# Patient Record
Sex: Male | Born: 1970 | Race: White | Hispanic: No | Marital: Married | Smoking: Never smoker
Health system: Southern US, Community
[De-identification: ages and names within clinical notes are randomized; demographics above are authoritative.]

## PROBLEM LIST (undated history)

## (undated) DIAGNOSIS — F419 Anxiety disorder, unspecified: Secondary | ICD-10-CM

## (undated) DIAGNOSIS — K219 Gastro-esophageal reflux disease without esophagitis: Secondary | ICD-10-CM

## (undated) DIAGNOSIS — IMO0001 Reserved for inherently not codable concepts without codable children: Secondary | ICD-10-CM

## (undated) DIAGNOSIS — K589 Irritable bowel syndrome without diarrhea: Secondary | ICD-10-CM

## (undated) DIAGNOSIS — J309 Allergic rhinitis, unspecified: Secondary | ICD-10-CM

## (undated) DIAGNOSIS — I10 Essential (primary) hypertension: Secondary | ICD-10-CM

## (undated) DIAGNOSIS — I341 Nonrheumatic mitral (valve) prolapse: Secondary | ICD-10-CM

## (undated) DIAGNOSIS — J324 Chronic pansinusitis: Secondary | ICD-10-CM

## (undated) HISTORY — DX: Essential (primary) hypertension: I10

## (undated) HISTORY — PX: COLONOSCOPY: SHX5424

## (undated) HISTORY — PX: TONSILLECTOMY: SUR1361

## (undated) HISTORY — DX: Irritable bowel syndrome, unspecified: K58.9

## (undated) HISTORY — PX: CARPAL TUNNEL RELEASE: SHX101

## (undated) HISTORY — DX: Reserved for inherently not codable concepts without codable children: IMO0001

## (undated) HISTORY — DX: Gastro-esophageal reflux disease without esophagitis: K21.9

## (undated) HISTORY — PX: WISDOM TOOTH EXTRACTION: SHX21

## (undated) HISTORY — DX: Allergic rhinitis, unspecified: J30.9

## (undated) HISTORY — DX: Anxiety disorder, unspecified: F41.9

## (undated) HISTORY — PX: ADENOIDECTOMY: SUR15

---

## 1999-07-28 ENCOUNTER — Encounter (INDEPENDENT_AMBULATORY_CARE_PROVIDER_SITE_OTHER): Payer: Self-pay | Admitting: Specialist

## 1999-07-28 ENCOUNTER — Other Ambulatory Visit: Admission: RE | Admit: 1999-07-28 | Discharge: 1999-07-28 | Payer: Self-pay | Admitting: Otolaryngology

## 2004-02-13 ENCOUNTER — Ambulatory Visit (HOSPITAL_COMMUNITY): Admission: RE | Admit: 2004-02-13 | Discharge: 2004-02-13 | Payer: Self-pay | Admitting: Family Medicine

## 2005-01-07 ENCOUNTER — Emergency Department (HOSPITAL_COMMUNITY): Admission: EM | Admit: 2005-01-07 | Discharge: 2005-01-07 | Payer: Self-pay | Admitting: Family Medicine

## 2005-11-14 ENCOUNTER — Ambulatory Visit (HOSPITAL_COMMUNITY): Admission: RE | Admit: 2005-11-14 | Discharge: 2005-11-14 | Payer: Self-pay | Admitting: Family Medicine

## 2006-08-24 ENCOUNTER — Ambulatory Visit (HOSPITAL_COMMUNITY): Admission: RE | Admit: 2006-08-24 | Discharge: 2006-08-24 | Payer: Self-pay | Admitting: Family Medicine

## 2006-08-31 ENCOUNTER — Encounter (HOSPITAL_COMMUNITY): Admission: RE | Admit: 2006-08-31 | Discharge: 2006-09-30 | Payer: Self-pay | Admitting: Family Medicine

## 2006-09-04 ENCOUNTER — Ambulatory Visit: Payer: Self-pay | Admitting: Gastroenterology

## 2006-09-06 ENCOUNTER — Ambulatory Visit (HOSPITAL_COMMUNITY): Admission: RE | Admit: 2006-09-06 | Discharge: 2006-09-06 | Payer: Self-pay | Admitting: Gastroenterology

## 2006-09-06 ENCOUNTER — Encounter: Payer: Self-pay | Admitting: Gastroenterology

## 2006-09-06 ENCOUNTER — Ambulatory Visit: Payer: Self-pay | Admitting: Gastroenterology

## 2006-11-03 ENCOUNTER — Ambulatory Visit: Payer: Self-pay | Admitting: Gastroenterology

## 2006-11-09 ENCOUNTER — Ambulatory Visit (HOSPITAL_COMMUNITY): Admission: RE | Admit: 2006-11-09 | Discharge: 2006-11-09 | Payer: Self-pay | Admitting: Gastroenterology

## 2006-11-14 ENCOUNTER — Ambulatory Visit: Payer: Self-pay | Admitting: Gastroenterology

## 2006-12-26 ENCOUNTER — Ambulatory Visit: Payer: Self-pay | Admitting: Gastroenterology

## 2006-12-28 ENCOUNTER — Ambulatory Visit (HOSPITAL_COMMUNITY): Admission: RE | Admit: 2006-12-28 | Discharge: 2006-12-28 | Payer: Self-pay | Admitting: Gastroenterology

## 2006-12-28 ENCOUNTER — Ambulatory Visit: Payer: Self-pay | Admitting: Gastroenterology

## 2007-02-01 ENCOUNTER — Ambulatory Visit (HOSPITAL_COMMUNITY): Admission: RE | Admit: 2007-02-01 | Discharge: 2007-02-01 | Payer: Self-pay

## 2007-04-04 ENCOUNTER — Emergency Department (HOSPITAL_COMMUNITY): Admission: EM | Admit: 2007-04-04 | Discharge: 2007-04-04 | Payer: Self-pay | Admitting: Emergency Medicine

## 2008-05-23 ENCOUNTER — Ambulatory Visit: Payer: Self-pay | Admitting: Sports Medicine

## 2008-05-23 DIAGNOSIS — M2140 Flat foot [pes planus] (acquired), unspecified foot: Secondary | ICD-10-CM | POA: Insufficient documentation

## 2008-05-23 DIAGNOSIS — M545 Low back pain, unspecified: Secondary | ICD-10-CM | POA: Insufficient documentation

## 2009-02-10 ENCOUNTER — Emergency Department (HOSPITAL_COMMUNITY): Admission: EM | Admit: 2009-02-10 | Discharge: 2009-02-10 | Payer: Self-pay | Admitting: Emergency Medicine

## 2009-02-11 ENCOUNTER — Encounter: Admission: RE | Admit: 2009-02-11 | Discharge: 2009-03-02 | Payer: Self-pay | Admitting: Orthopedic Surgery

## 2009-03-02 ENCOUNTER — Encounter: Admission: RE | Admit: 2009-03-02 | Discharge: 2009-05-13 | Payer: Self-pay | Admitting: Orthopedic Surgery

## 2009-06-07 ENCOUNTER — Emergency Department (HOSPITAL_COMMUNITY): Admission: EM | Admit: 2009-06-07 | Discharge: 2009-06-07 | Payer: Self-pay | Admitting: Family Medicine

## 2009-07-02 ENCOUNTER — Encounter: Payer: Self-pay | Admitting: Gastroenterology

## 2009-07-14 ENCOUNTER — Encounter: Payer: Self-pay | Admitting: Gastroenterology

## 2009-07-31 ENCOUNTER — Telehealth (INDEPENDENT_AMBULATORY_CARE_PROVIDER_SITE_OTHER): Payer: Self-pay

## 2009-08-03 ENCOUNTER — Ambulatory Visit (HOSPITAL_COMMUNITY): Admission: RE | Admit: 2009-08-03 | Discharge: 2009-08-03 | Payer: Self-pay | Admitting: Gastroenterology

## 2009-08-03 ENCOUNTER — Ambulatory Visit: Payer: Self-pay | Admitting: Gastroenterology

## 2010-04-01 NOTE — Progress Notes (Signed)
Summary: SUPREP KIT GIVEN  Phone Note Call from Patient   Caller: Patient Summary of Call: Pt's wife came by. She had been to two pharmacies to get the SUPREP KIT  and neither had it. I gave her a kit from the office. Initial call taken by: Cloria Spring LPN,  August 01, 6043 10:49 AM

## 2010-04-01 NOTE — Letter (Signed)
Summary: Internal Other [facility]  Internal Other [facility]ingo Dimes   Imported By: Cloria Spring LPN 62/13/0865 78:46:96  _____________________________________________________________________  External Attachment:    Type:   Image     Comment:   External Document

## 2010-04-01 NOTE — Letter (Signed)
Summary: Internal Other [facility]  Internal Other [facility]   Imported By: Cloria Spring LPN 14/78/2956 21:30:86  _____________________________________________________________________  External Attachment:    Type:   Image     Comment:   External Document

## 2010-07-13 NOTE — Op Note (Signed)
NAME:  Brady [name], Brady [name]           ACCOUNT NO.:  [id number]   MEDICAL RECORD NO.:  [id number]          PATIENT TYPE:  AMB   LOCATION:  DAY                           FACILITY:  [facility]   PHYSICIAN:  Kassie Mends, M.D.      DATE OF BIRTH:  [birth year]   DATE OF PROCEDURE:  12/28/2006  DATE OF DISCHARGE:  12/28/2006                               OPERATIVE REPORT   PROCEDURE:  Capsule Endoscopy   INDICATION FOR EXAM:  [name] is a 40 year old male, who continues  to complain of persistent abdominal pain.  His evaluation for abdominal  pain has included an upper endoscopy and a colonoscopy which revealed no  etiology.  He does use anti-inflammatory drugs.   PROCEDURE DATA:  Height 75 inches, weight 198.6 pounds, waist 40 inches.  Gastric passage time 47 minutes.  Small bowel passage time five hours  and 55 minutes.   PROCEDURE FINDINGS:  1. Limited view of the gastric mucosa.  Mucosal lining appeared      normal.  2. Excellent views of the small bowel mucosa.  No erythema, ulcers,      mass or arteriovenous malformations seen.  3. Limited view of colonic mucosa due to stool.   SUMMARY AND RECOMMENDATIONS:  Will call Celebrex 200 mg daily for pain.  He also may use hydrocodone 5/325 #25 one or two by mouth every four to  six hours as needed for abdominal pain. He is to proceed with an  evaluation at [facility] for his  persistent abdominal pain.  No etiology for his abdominal pain has been  identified.      Kassie Mends, M.D.  Electronically Signed     SM/MEDQ  D:  12/31/2006  T:  01/01/2007  Job:  [id number]   cc:   Lorin Picket A. Gerda Diss, MD  Fax: [phone]

## 2010-07-13 NOTE — Op Note (Signed)
Brady [name], Brady [name]           ACCOUNT NO.:  [id number]   MEDICAL RECORD NO.:  [id number]          PATIENT TYPE:  AMB   LOCATION:  DAY                           FACILITY:  [facility]   PHYSICIAN:  Kassie Mends, M.D.      DATE OF BIRTH:  Sep 30, 1970   DATE OF PROCEDURE:  09/06/2006  DATE OF DISCHARGE:                               OPERATIVE REPORT   REFERRING PHYSICIAN:  Scott A. Gerda Diss, MD   PROCEDURE:  Esophagogastroduodenoscopy with cold forceps biopsy.   INDICATION FOR EXAM:  Brady [name] is a 40 year old male who has  persistent epigastric pain and weight loss.   FINDINGS:  1. Patchy erythema in the antrum without ulcers or erosions.  Biopsies      obtained via cold forceps to evaluate for H. pylori gastritis or      eosinophilic gastritis.  2. Normal duodenal bulb and second portion of the duodenum.  Biopsies      obtained to evaluate for celiac sprue or eosinophilic gastritis,      eosinophilic duodenitis.  3. Normal esophagus without evidence of Barrett's erosions or      ulcerations.   RECOMMENDATIONS:  1. Brady [name] abdominal pain and weight loss may be due to      gastroesophageal reflux disease, gastritis or celiac sprue.  Will      call Brady [name] with the results of his biopsies.  2. He should continue his Prilosec twice daily and continue to avoid      gastric irritants.  He is given a handout on gastric irritants and      gastritis.  3. He already has a follow-up appointment to see me in 4-6 weeks.   MEDICATIONS:  1. Demerol 100 mg IV.  2. Versed 8 mg IV.   PROCEDURE TECHNIQUE:  Physical exam was performed.  Informed consent was  obtained from the patient after explaining the benefits, risks and  alternatives to the procedure.  The patient was connected to the monitor  and placed in the left lateral position.  Continuous oxygen was provided  by nasal cannula and IV medicine administered through an indwelling  cannula.  After administration of sedation,  the patient's esophagus was  intubated and the  scope was advanced under direct visualization to the second portion of  the duodenum.  The scope was withdrawn slowly by carefully examining the  color, texture, anatomy and integrity of the mucosa on the way out.  The  patient was recovered in endoscopy and discharged home in satisfactory  condition.      Kassie Mends, M.D.  Electronically Signed     SM/MEDQ  D:  09/06/2006  T:  09/06/2006  Job:  [id number]   cc:   Lorin Picket A. Gerda Diss, MD  Fax: [phone]

## 2010-07-13 NOTE — Consult Note (Signed)
NAMETRYONE, KILLE           ACCOUNT NO.:  [id number]   MEDICAL RECORD NO.:  [id number]          PATIENT TYPE:  AMB   LOCATION:  DAY                           FACILITY:  [facility]   PHYSICIAN:  Brady [name], M.D.      DATE OF BIRTH:  Jun 27, 1970   DATE OF CONSULTATION:  12/26/2006  DATE OF DISCHARGE:                                 CONSULTATION   REFERRING PHYSICIAN:  Scott A. Gerda Diss, MD.   PROBLEM LIST:  1. Persistent right upper quadrant abdominal pain.  2. History of mild gastritis.  3. Weight loss.   SUBJECTIVE:  Brady [name] is a 40 year old male who was initially seen  in July of 2008.  When he was seen in July of 2008 he was complaining of  persistent epigastric pain and weight loss.  He had been having this  problem since December of 2007.  He reports that whenever he would eat  he would get abdominal pain.  He describes the pain as located in the  middle of his abdomen radiating to the right upper quadrant, right flank  and back.  It begins 10-15 minutes after eating.  It is constant and  achy.  He feels bloated approximately 5-10 minutes after eating.  He  tried avoiding dairy products and fatty foods which did improve his  symptoms.  He complains of burping and feeling weak.  He had an  intentional 35 pound weight loss and an unintentional 15 pound weight  loss.  Complains of subjective chills after eating.  He has had an upper  endoscopy performed which on gross exam showed patchy erythema in the  antrum without ulcer or erosion.  Biopsies showed benign gastric mucosa  and benign small bowel mucosa.  No evidence of celiac sprue.  So he was  asked to continue on a proton pump inhibitor, Prilosec twice daily and  to avoid gastric irritants.  Approximately 7 days following his exam he  states that his pain was better on twice daily Prilosec.  He was asked  to follow up with [facility] GERD self-care recommendations.  On his  initial exam he was 218 pounds.  Two months  later he was 208 pounds.  He  was seen in September of 2008 and had an endoscopic ultrasound performed  by Dr. Christella Hartigan because of his persistent right upper quadrant pain to  rule out microlithiasis or sludge as an etiology for his pain.  His  endoscopic ultrasound was normal.  His pancreatic parenchyma was normal  throughout the gland.  His common bile duct was normal and nondilated  without stones or echogenic free.  His main pancreatic duct was normal.  He had a normal gallbladder.  He was seen and asked to continue his  Prilosec 20 mg daily.   He is seen today as a return patient visit.  He is now 208 pounds.  He  continues to complain of pain primarily in his right upper quadrant but  he has some radiation to his right lower quadrant.  He has nausea with  eating.  He has come near to vomiting but not quite  fully manifested  emesis.  He is avoiding red meats and fatty foods.  He is able to [place]  sandwiches.  His bowel movements are normal.  Sometimes he is bloated.  He describes the pain as crampy pain after eating and when he gets  hungry.  He has had some dizziness associated with a sinus infection.  The abdominal pain occurs several times a day.  He denies any rashes or  sores in his mouth.  He gets hungry but food causes him to be  uncomfortable on his right side.   MEDICATIONS:  1. Prilosec daily.  2. Glucosamine.  3. Vitamins.  4. Allegra 180 mg daily.  5. Flonase.  6. Clindamycin.   PHYSICAL EXAMINATION:  VITAL SIGNS:  Weight 202 pounds, height 6 feet 3  inches, BMI 25.3 (overweight), temperature 97.9, blood pressure 128/88,  pulse 64.  GENERAL:  He is in no apparent distress.  Alert and oriented x4.  HEENT:  Exam is atraumatic, normocephalic.  Pupils equal and reactive to  light.  Mouth no oral lesions.  Posterior pharynx without erythema or  exudate.  NECK:  Full range of motion.  No lymphadenopathy.  LUNGS:  Clear to auscultation bilaterally.  CARDIOVASCULAR:   Shows regular rhythm, no murmur, normal S1-S2.  ABDOMEN:  Bowel sounds are present, soft, nontender, nondistended.  No  rebound or guarding.  EXTREMITIES:  Without cyanosis, clubbing or edema.  SKIN:  He has no pretibial lesions.  NEUROLOGICAL:  He has no focal neurologic deficits.   ASSESSMENT:  Brady [name] is a 40 year old male who has persistent  right upper quadrant sometimes right lower quadrant pain that is  associated with nausea and it is usually after eating.  He has had a  workup to include a complete abdominal ultrasound in June of 2008 which  showed a gallbladder which was normal, no stones.  Liver, pancreas and  spleen were normal.  He has had a HIDA scan which showed a gallbladder  ejection fraction of 61.7% and patent biliary and cystic duct.  The  differential diagnosis for his persistent symptoms includes irritable  bowel syndrome, low likelihood of small bowel bacterial overgrowth or  occult small bowel disease.   Thank you for allowing me to see Brady [name] in consultation.  My  recommendations follow:   RECOMMENDATIONS:  1. Will check an irritable bowel Prometheus panel.  2. Will schedule a capsule endoscopy on Thursday to completely      complete the evaluation of his GI tract.  3. He declined pain medicine.  Following the capsule endoscopy will      begin Levsin sublingual 0.125 mg 30 minutes before eating to see if      we can assist with the pain.  He could probably benefit also from      the addition of fiber.  4. Will schedule an appointment at [facility] within the next month for a second opinion in      regards to his abdominal pain.  5. He has a follow up appointment to see me in 4-6 weeks.      Brady [name], M.D.  Electronically Signed     SM/MEDQ  D:  12/26/2006  T:  12/27/2006  Job:  [id number]   cc:   Brady Picket A. Gerda Diss, MD  Fax: [phone]

## 2010-07-13 NOTE — Consult Note (Signed)
Brady [name], Brady [name]           ACCOUNT NO.:  [id number]   MEDICAL RECORD NO.:  [id number]          PATIENT TYPE:  AMB   LOCATION:  DAY                           FACILITY:  [facility]   PHYSICIAN:  Kassie Mends, M.D.      DATE OF BIRTH:  November 17, 1970   DATE OF CONSULTATION:  09/04/2006  DATE OF DISCHARGE:                                 CONSULTATION   REFERRING PHYSICIAN:  Scott A. Luking, MD.   REASON FOR CONSULTATION:  Abdominal pain and weight loss.   HISTORY OF PRESENT ILLNESS:  Brady [name] is a 40 year old male whose  had persistent abdominal pain since father's day.  He has never had a  problem like this before.  He has been treated for gastroesophageal  reflux disease in December 2007 with Prilosec but this pain is different  than his heartburn pain.  Whenever he eats he gets abdominal pain.  The  abdominal pain is located in the middle of his abdomen and radiates to  his right upper quadrant, right flank and back.  It begins 10-15 minutes  after eating.  It is a constant achy pain that gets worse when he eats.  He also feels bloated and full approximately 5-10 minutes after eating.  He has cut out dairy products and fatty foods which seem to have  improved his symptoms.  He has a lot of burping every morning between  4:40 and 5:00 a.m.  He is also feeling weak.  He also has changed his  consumption to small meals frequently.  He did have a 35-pound  intentional weight loss but since Father's Day he reports losing 15  pounds unintentionally.  For the last week, his Prilosec has been  increased to twice a day but he has not seen any improvement in his  symptoms.  Occasionally he reports subjective chills. The chills may  occur right after he eats and sometimes later in the evening.  He also  has entirely liquid stool two to three times a week.  He has not seen  any blood in his stool.  He complains of frequent urination as well.  He  is nauseated one to two times a day.  The  severity is mild to moderate.  Some days he has good days and some days he has bad days.  His usual  diet consists of chicken and [place].  He also can tolerate soups and  cereals as well as skim milk. He had a hot dog on the 4th of July which  causes significant increase in his pain.  He denies any problems  swallowing.  He has not had any constipation.   PAST MEDICAL HISTORY:  1. Mitral valve prolapse.  2. Allergies .  3. Gastroesophageal reflux disease.   PAST SURGICAL HISTORY:  1. Carpal tunnel syndrome surgery.  2. Sinus surgery.  3. Wisdom tooth extraction.  4. Tonsillectomy.   ALLERGIES:  PENICILLIN.   MEDICATIONS:  1. Prilosec twice daily.  2. Glucosamine.  3. Multivitamin.  4. Allegra 180 mg.  5. Flonase.   FAMILY HISTORY:  He has no family history of colon polyps.  He had an  uncle who had colon cancer in his 19s.  He also has a family history of  diabetes, hypertension and gastroesophageal reflux disease.   SOCIAL HISTORY:  He is married and has a 61-year-old child.  He works for  [employer] in [employer]. He does not smoke.  He  drinks less than a six-pack a year.   REVIEW OF SYSTEMS:  Per the HPI otherwise all systems negative.   PHYSICAL EXAM:  VITAL SIGNS:  Weight 216.5 pounds, height 6 feet 3  inches, BMI 27 (overweight), temperature 98.1, blood pressure 120/90,  pulse 60.  GENERAL:  He is in no apparent distress, alert and oriented x4.  HEENT:  Atraumatic, normocephalic.  Pupils equal and reactive to light.  Mouth  no oral lesions.  Posterior pharynx without erythema or exudate.  NECK:  Has full range of motion and no lymphadenopathy.  LUNGS:  Clear to  auscultation bilaterally.  CARDIOVASCULAR:  Regular rhythm, no murmur,  normal S1 and S2.  ABDOMEN:  Bowel sounds are present, soft,  nondistended, mild tenderness to palpation in the epigastrium without  rebound or guarding, no hepatosplenomegaly, abdominal bruits or  pulsatile masses.   EXTREMITIES:  Without cyanosis, clubbing or edema.  NEURO:  He has no focal neurologic deficits.   RADIOGRAPHIC STUDIES:  June 26 complete ultrasound showed gallbladder  without stones or wall thickening.  His common bile duct was 3 mm.  His  liver and pancreas were normal.  He had a HIDA scan which showed a  gallbladder ejection fraction of 61.7%.   LABS:  July 2008 - white count 8.1, hemoglobin 16.5, platelets 241, BUN  9, creatinine 0.91, albumin 4.3, ALT 16, AST 16, amylase 69, lipase 26.   ASSESSMENT:  Brady [name] is a 40 year old male who has persistent  epigastric pain which radiates to his right upper quadrant in his back  that is worse with meals.  The differential diagnosis includes  gastritis, ulcer, and a low likelihood of sphincter of Oddi dysfunction.  Thank you for allowing me to see Brady [name] in consultation.  My  recommendations follow.   RECOMMENDATIONS:  1. Brady [name] should continue his Prilosec twice daily.  He is      given a handout on gastric irritants and asked to avoid them.  He      rarely uses Aleve but I wanted to ensure that he understands that      he should avoid anti-inflammatory drugs.  2. He will be scheduled for an upper endoscopy on Wednesday, July 9.      Biopsies will be obtained of the antrum and the second portion of      the duodenum to evaluate for celiac sprue.  3. Follow-up in 4-6 weeks.      Kassie Mends, M.D.  Electronically Signed     SM/MEDQ  D:  09/04/2006  T:  09/05/2006  Job:  [id number]   cc:   Lorin Picket A. Gerda Diss, MD  Fax: [phone]

## 2010-07-13 NOTE — Assessment & Plan Note (Signed)
NAME:  MEHTAB, DOLBERRY            CHART#:  [id number]   DATE:  11/03/2006                       DOB:  01/03/1971   REFERRED BY:  Dr. Lilyan Punt.   PROBLEM LIST:  1. Episodic abdominal pain.  2. Mild gastritis.  3. Weight loss.   SUBJECTIVE:  Mr. Kirsh is a 40 year old male who presents as a  return patient visit. He is able to tolerate more in his diet. Fried  foods, ice cream, tomatoes, and other fatty foods give him pain that  begins in his right upper quadrant and radiates to his back. He also may  have diarrhea. The symptoms start 15 to 20 minutes after eating and last  a couple of hours. The pain is better with the addition of Prilosec, but  not completely resolved. As long as he avoids fatty foods, his symptoms  do not reoccur. Recently, he had fried zucchini and it brought on the  symptoms. Pain is the only thing that this food brings on. The pain  severity allows him to function. He does drink one beer a month. Last  week he had one beer and it caused his pain to occur. His grandfather  and his grandmother had their gallbladders removed.   MEDICATIONS:  1. Prilosec twice daily.  2. Glucosamine.  3. Vitamins.  4. Allegra 180 mg daily.  5. Flonase daily.   OBJECTIVE:  VITAL SIGNS:  Weight 208.5 pounds (down 8 pounds since July  of 2008), height 6 foot 2 inches, BMI 26.7 (overweight), temperature  98.3, blood pressure 118/80, pulse 60.GENERAL:  He is in no apparent  distress. He  is alert and oriented x4.LUNGS:  Clear to auscultation  bilaterally.CARDIOVASCULAR:  He has a regular rhythm, no murmurs, normal  S1 and S2.ABDOMEN:  Bowel sounds are present, soft, nontender,  nondistended, no rebound or guarding.NEUROLOGIC:  He has no focal  neurologic deficits.   ASSESSMENT:  Mr. Conely is a 40 year old male who has episodic right  upper quadrant pain that radiates to his back. The differential  diagnosis includes gastritis and a biliary source (distal common  bile  duct stone, sludge, microlithiasis). He has a low likelihood of  sphincter of Oddi dysfunction. Thank you for allowing me to see Mr.  Spittler in consultation. My recommendations are as follows.   RECOMMENDATIONS:  1. He is given a prescription for Prilosec 20 mg tablets daily. His      dose has been reduced to see if we can still achieve pain relief      without using the higher dose.  2. He will be scheduled for an endoscopic ultrasound as soon as      possible with Dr. Christella Hartigan to evaluate for sludge, microlithiasis, or      distal common bile duct stone as an etiology for his post-prandial      right upper quadrant pain that radiates to his back. He has never      had any liver enzyme elevations associated with his pain.  3. Return patient visit in six weeks.       Kassie Mends, M.D.  Electronically Signed     SM/MEDQ  D:  11/03/2006  T:  11/04/2006  Job:  [id number]   cc:   Lorin Picket A. Gerda Diss, MD

## 2010-12-29 ENCOUNTER — Inpatient Hospital Stay (INDEPENDENT_AMBULATORY_CARE_PROVIDER_SITE_OTHER)
Admission: RE | Admit: 2010-12-29 | Discharge: 2010-12-29 | Disposition: A | Discharge status: Home / Self Care | Payer: 59 | Source: Ambulatory Visit | Attending: Family Medicine | Admitting: Family Medicine

## 2010-12-29 DIAGNOSIS — J019 Acute sinusitis, unspecified: Secondary | ICD-10-CM

## 2011-03-29 ENCOUNTER — Ambulatory Visit (INDEPENDENT_AMBULATORY_CARE_PROVIDER_SITE_OTHER): Payer: 59 | Admitting: Orthopedic Surgery

## 2011-03-29 ENCOUNTER — Encounter: Payer: Self-pay | Admitting: Orthopedic Surgery

## 2011-03-29 VITALS — Ht 75.0 in | Wt 252.0 lb

## 2011-03-29 DIAGNOSIS — M7710 Lateral epicondylitis, unspecified elbow: Secondary | ICD-10-CM | POA: Insufficient documentation

## 2011-03-29 NOTE — Progress Notes (Signed)
Patient ID: BLANCHARD WILLHITE, male   DOB: [birth year], 41 y.o.   MRN: [id number] X-ray report.  3 views LEFT elbow.  LEFT elbow pain.  AP, lateral, LEFT elbow normal joint surfaces. Normal bone alignment.  Impression normal LEFT elbow. Subjective:    BAYAN KUSHNIR is a 41 y.o. male who presents with left elbow pain. Onset of the symptoms was June 2012. Inciting event: none known. Current symptoms include: point tenderness over the lateral epicondyle. Pain is aggravated by: grasping, lifting heavy objects, lifting small objects . Symptoms have waxed and waned. Patient has had prior elbow problems. Evaluation to date: none. Treatment to date: avoidance of offending activity, corticosteroid injection which was somewhat effective, forearm band which was somewhat effective, ice and OTC analgesics.  The following portions of the patient's history were reviewed and updated as appropriate: allergies, current medications, past family history, past medical history, past social history, past surgical history and problem list.  Review of Systems A comprehensive review of systems was negative.   Objective:    Ht 6\' 3"  (1.905 m)  Wt 252 lb (114.306 kg)  BMI 31.50 kg/m2  Physical Exam(12) GENERAL: normal development   CDV: pulses are normal   Skin: normal  Lymph: nodes were not palpable/normal  Psychiatric: awake, alert and oriented  Neuro: normal sensation  Right elbow: without deformity and full active ROM  Left elbow:  without deformity, swelling present, full active ROM and tenderness over lateral epicondyle   X-ray left elbow: no fracture, dislocation, swelling or degenerative changes noted   Assessment:    left lateral epicondylitis    Plan:    Tennis elbow splint. NSAIDs per medication orders. OTC analgesics as needed. PT referral.

## 2011-03-29 NOTE — Patient Instructions (Addendum)
Start physical therapy Apply Max Freeze to elbow three times daily Wear brace daily

## 2011-04-13 ENCOUNTER — Ambulatory Visit: Payer: 59 | Attending: Orthopedic Surgery

## 2011-04-13 DIAGNOSIS — M25539 Pain in unspecified wrist: Secondary | ICD-10-CM | POA: Insufficient documentation

## 2011-04-13 DIAGNOSIS — M25629 Stiffness of unspecified elbow, not elsewhere classified: Secondary | ICD-10-CM | POA: Insufficient documentation

## 2011-04-13 DIAGNOSIS — IMO0001 Reserved for inherently not codable concepts without codable children: Secondary | ICD-10-CM | POA: Insufficient documentation

## 2011-04-19 ENCOUNTER — Ambulatory Visit: Payer: 59 | Admitting: Physical Therapy

## 2011-04-21 ENCOUNTER — Ambulatory Visit: Payer: 59 | Admitting: Rehabilitation

## 2011-04-25 ENCOUNTER — Ambulatory Visit: Payer: 59

## 2011-04-27 ENCOUNTER — Ambulatory Visit: Payer: 59

## 2011-05-09 ENCOUNTER — Ambulatory Visit: Payer: 59 | Attending: Orthopedic Surgery

## 2011-05-09 DIAGNOSIS — M25539 Pain in unspecified wrist: Secondary | ICD-10-CM | POA: Insufficient documentation

## 2011-05-09 DIAGNOSIS — IMO0001 Reserved for inherently not codable concepts without codable children: Secondary | ICD-10-CM | POA: Insufficient documentation

## 2011-05-10 ENCOUNTER — Encounter: Payer: Self-pay | Admitting: Orthopedic Surgery

## 2011-05-10 ENCOUNTER — Ambulatory Visit (INDEPENDENT_AMBULATORY_CARE_PROVIDER_SITE_OTHER): Payer: 59 | Admitting: Orthopedic Surgery

## 2011-05-10 VITALS — BP 124/62 | Ht 75.0 in | Wt 252.0 lb

## 2011-05-10 DIAGNOSIS — M7710 Lateral epicondylitis, unspecified elbow: Secondary | ICD-10-CM

## 2011-05-10 NOTE — Progress Notes (Signed)
Patient ID: Brady [name], male   DOB: 1970/05/19, 41 y.o.   MRN: [id number] Chief Complaint  Patient presents with  . Follow-up    6 week recheck Left elbow following PT/tennis elbow    Follow-up visit after physical therapy for LEFT tennis elbow  Patient complains of Continued pain along the lateral upper condyle despite one year of treatment 3 injections anti-inflammatories physical therapy electrical stimulation tennis elbow bracing Review of Systems  Neurological: Negative for tingling and sensory change.    We discussed possibility of tennis elbow release.  We discussed low complication rate mainly wound infection and recurrence.  The patient is going out-of-town for peptic training will call as when he gets back to schedule the surgery late April early May.  Today's exam  LEFT elbow: Tenderness over the radial head joint capsule and lateral upper condyle pain full extension of the wrist.  Full range of motion.  Mild weakness in extension elbow remained stable.  Skin is intact neurovascular exam is normal  Recommend as above

## 2011-05-10 NOTE — Patient Instructions (Signed)
Call after EPIC training to schedule surgery

## 2011-05-12 ENCOUNTER — Ambulatory Visit: Payer: 59

## 2011-06-09 ENCOUNTER — Other Ambulatory Visit: Payer: Self-pay | Admitting: *Deleted

## 2011-06-20 ENCOUNTER — Telehealth: Payer: Self-pay | Admitting: Orthopedic Surgery

## 2011-06-20 NOTE — Telephone Encounter (Signed)
Contact to insurer, [employer],ph# [phone], regarding pre-authorization information, out-patient surgery scheduled 07/08/11 at [facility], for left tennis elbow release; potential CPT codes 28413, [phone]

## 2011-06-22 NOTE — Telephone Encounter (Signed)
Called back to [facility] at another ph#, [phone]. Per Doreatha Lew, no pre-authorization required for out-patient surgery. Reference # [id number].

## 2011-06-29 ENCOUNTER — Encounter (HOSPITAL_COMMUNITY): Payer: Self-pay | Admitting: Pharmacy Technician

## 2011-06-30 ENCOUNTER — Encounter (HOSPITAL_COMMUNITY): Payer: Self-pay

## 2011-06-30 ENCOUNTER — Encounter (HOSPITAL_COMMUNITY)
Admission: RE | Admit: 2011-06-30 | Discharge: 2011-06-30 | Disposition: A | Discharge status: Home / Self Care | Payer: 59 | Source: Ambulatory Visit | Attending: Orthopedic Surgery | Admitting: Orthopedic Surgery

## 2011-06-30 HISTORY — DX: Nonrheumatic mitral (valve) prolapse: I34.1

## 2011-06-30 LAB — BASIC METABOLIC PANEL
BUN: 14 mg/dL (ref 6–23)
Calcium: 9.8 mg/dL (ref 8.4–10.5)
GFR calc Af Amer: >90 mL/min (ref >90)
GFR calc non Af Amer: 83 mL/min — ABNORMAL LOW (ref >90)
Glucose, Bld: 66 mg/dL — ABNORMAL LOW (ref 70–99)
Potassium: 4.6 mEq/L (ref 3.5–5.1)
Sodium: 140 mEq/L (ref 135–145)

## 2011-06-30 LAB — SURGICAL PCR SCREEN
MRSA, PCR: NEGATIVE
Staphylococcus aureus: NEGATIVE

## 2011-06-30 LAB — HEMOGLOBIN AND HEMATOCRIT, BLOOD: HCT: 46.7 % (ref 39.0–52.0)

## 2011-06-30 MED ORDER — CHLORHEXIDINE GLUCONATE 4 % EX LIQD
60.0000 mL | Freq: Once | CUTANEOUS | Status: DC
  Filled 2011-06-30: qty 60

## 2011-06-30 NOTE — Patient Instructions (Addendum)
Your procedure is scheduled on:  07/08/2011  Report to [facility] at 6:15     AM.  Call this number if you have problems the morning of surgery: [phone]   Remember:   Do not drink or eat food:After Midnight.    Clear liquids include soda, tea, black coffee, apple or grape juice, broth.  Take these medicines the morning of surgery with A SIP OF WATER:    Do not wear jewelry, make-up or nail polish.  Do not wear lotions, powders, or perfumes. You may wear deodorant.  Do not shave 48 hours prior to surgery.  Do not bring valuables to the hospital.  Contacts, dentures or bridgework may not be worn into surgery.  Leave suitcase in the car. After surgery it may be brought to your room.  For patients admitted to the hospital, checkout time is 11:00 AM the day of discharge.   Patients discharged the day of surgery will not be allowed to drive home.  Name and phone number of your driver:   Special Instructions: CHG Shower Shower 2 days before surgery and 1 day before surgery with Hibiclens.   Please read over the following fact sheets that you were given: Pain Booklet, Surgical Site Infection Prevention, Anesthesia Post-op Instructions and Care and Recovery After Surgery

## 2011-07-07 MED ORDER — VANCOMYCIN HCL 1000 MG IV SOLR
1500.0000 mg | INTRAVENOUS | Status: AC
  Administered 2011-07-08: 1500 mg via INTRAVENOUS
  Filled 2011-07-07: qty 1500

## 2011-07-07 NOTE — H&P (Signed)
  Subjective:   Brady [name] is a 41 y.o. male who presents with left elbow pain. Onset of the symptoms was June 2012. Inciting event: none known. Current symptoms include: point tenderness over the lateral epicondyle. Pain is aggravated by: grasping, lifting heavy objects, lifting small objects . Symptoms have waxed and waned. Patient has had prior elbow problems. Evaluation to date: none. Treatment to date: avoidance of offending activity, corticosteroid injection which was somewhat effective, forearm band which was somewhat effective, ice and OTC analgesics.   The patient was treated with a conservative plan and did not improve and presented back to the office complaining of persistent pain and decided to proceed with the left tennis elbow release  The following portions of the patient's history were reviewed and updated as appropriate: allergies, current medications, past family history, past medical history, past social history, past surgical history and problem list.  Review of Systems  A comprehensive review of systems was negative.   Past Surgical History  Procedure Date  . Carpal tunnel release   . Colonoscopy   . Wisdom tooth extraction     Past Medical History  Diagnosis Date  . Mitral prolapse     Family History  Problem Relation Age of Onset  . Diabetes      History   Social History  . Marital Status: Legally Separated    Spouse Name: N/A    Number of Children: N/A  . Years of Education: college   Occupational History  . computer IT    Social History Main Topics  . Smoking status: Never Smoker   . Smokeless tobacco: Not on file  . Alcohol Use: Yes     one beer a month  . Drug Use: No  . Sexually Active: Not on file   Other Topics Concern  . Not on file   Social History Narrative  . No narrative on file    Objective:   Ht 6\' 3"  (1.905 m)  Wt 252 lb (114.306 kg)  BMI 31.50 kg/m2  Vital signs are stable as recorded  General appearance is  normal  The patient is alert and oriented x3  The patient's mood and affect are normal  Gait assessment:normal The cardiovascular exam reveals normal pulses and temperature without edema swelling.  The lymphatic system is negative for palpable lymph nodes  The sensory exam is normal.  There are no pathologic reflexes.  Balance is normal. Lower extremity exam  Ambulation is normal.  Inspection and palpation revealed no tenderness or abnormality in alignment in the lower extremities. Range of motion is full.  Strength is grade 5.   all joints are stable.  Right elbow:  without deformity and full active ROM   Left elbow:  without deformity, swelling present, full active ROM and tenderness over lateral epicondyle    X-ray left elbow: no fracture, dislocation, swelling or degenerative changes noted   Left tennis elbow  Left tennis elbow release

## 2011-07-08 ENCOUNTER — Encounter (HOSPITAL_COMMUNITY): Payer: Self-pay | Admitting: Anesthesiology

## 2011-07-08 ENCOUNTER — Encounter (HOSPITAL_COMMUNITY)
Admission: RE | Disposition: A | Discharge status: Home / Self Care | Payer: Self-pay | Source: Ambulatory Visit | Attending: Orthopedic Surgery

## 2011-07-08 ENCOUNTER — Ambulatory Visit (HOSPITAL_COMMUNITY): Payer: 59 | Admitting: Anesthesiology

## 2011-07-08 ENCOUNTER — Encounter (HOSPITAL_COMMUNITY): Payer: Self-pay | Admitting: *Deleted

## 2011-07-08 ENCOUNTER — Ambulatory Visit (HOSPITAL_COMMUNITY)
Admission: RE | Admit: 2011-07-08 | Discharge: 2011-07-08 | Disposition: A | Discharge status: Home / Self Care | Payer: 59 | Source: Ambulatory Visit | Attending: Orthopedic Surgery | Admitting: Orthopedic Surgery

## 2011-07-08 DIAGNOSIS — Z01812 Encounter for preprocedural laboratory examination: Secondary | ICD-10-CM | POA: Insufficient documentation

## 2011-07-08 DIAGNOSIS — M7710 Lateral epicondylitis, unspecified elbow: Secondary | ICD-10-CM | POA: Insufficient documentation

## 2011-07-08 HISTORY — PX: LATERAL EPICONDYLE RELEASE: SHX1958

## 2011-07-08 SURGERY — TENNIS ELBOW RELEASE/NIRSCHEL PROCEDURE
Anesthesia: General | Site: Elbow | Laterality: Left | Wound class: Clean

## 2011-07-08 MED ORDER — PROPOFOL 10 MG/ML IV BOLUS
INTRAVENOUS | Status: DC | PRN
  Administered 2011-07-08: 200 mg via INTRAVENOUS

## 2011-07-08 MED ORDER — SODIUM CHLORIDE 0.9 % IR SOLN
Status: DC | PRN
  Administered 2011-07-08: 1000 mL

## 2011-07-08 MED ORDER — ONDANSETRON HCL 4 MG/2ML IJ SOLN: 4.0000 mg | Freq: Once | INTRAMUSCULAR | Status: DC | PRN

## 2011-07-08 MED ORDER — ONDANSETRON HCL 4 MG/2ML IJ SOLN
INTRAMUSCULAR | Status: AC
  Administered 2011-07-08: 4 mg via INTRAVENOUS
  Filled 2011-07-08: qty 2

## 2011-07-08 MED ORDER — LACTATED RINGERS IV SOLN: INTRAVENOUS | Status: DC

## 2011-07-08 MED ORDER — FENTANYL CITRATE 0.05 MG/ML IJ SOLN
25.0000 ug | INTRAMUSCULAR | Status: DC | PRN
  Administered 2011-07-08 (×2): 50 ug via INTRAVENOUS

## 2011-07-08 MED ORDER — GLYCOPYRROLATE 0.2 MG/ML IJ SOLN
INTRAMUSCULAR | Status: AC
  Administered 2011-07-08: 0.2 mg via INTRAVENOUS
  Filled 2011-07-08: qty 1

## 2011-07-08 MED ORDER — FENTANYL CITRATE 0.05 MG/ML IJ SOLN
INTRAMUSCULAR | Status: DC | PRN
  Administered 2011-07-08 (×2): 50 ug via INTRAVENOUS
  Administered 2011-07-08: 25 ug via INTRAVENOUS
  Administered 2011-07-08: 50 ug via INTRAVENOUS
  Administered 2011-07-08: 25 ug via INTRAVENOUS

## 2011-07-08 MED ORDER — VANCOMYCIN HCL IN DEXTROSE 1-5 GM/200ML-% IV SOLN
INTRAVENOUS | Status: AC
  Filled 2011-07-08: qty 200

## 2011-07-08 MED ORDER — MIDAZOLAM HCL 2 MG/2ML IJ SOLN
INTRAMUSCULAR | Status: AC
  Administered 2011-07-08: 2 mg via INTRAVENOUS
  Filled 2011-07-08: qty 2

## 2011-07-08 MED ORDER — ONDANSETRON HCL 4 MG/2ML IJ SOLN
4.0000 mg | Freq: Once | INTRAMUSCULAR | Status: AC
  Administered 2011-07-08: 4 mg via INTRAVENOUS

## 2011-07-08 MED ORDER — LIDOCAINE HCL 1 % IJ SOLN
INTRAMUSCULAR | Status: DC | PRN
  Administered 2011-07-08: 40 mg via INTRADERMAL

## 2011-07-08 MED ORDER — FENTANYL CITRATE 0.05 MG/ML IJ SOLN
INTRAMUSCULAR | Status: AC
  Administered 2011-07-08: 50 ug via INTRAVENOUS
  Filled 2011-07-08: qty 2

## 2011-07-08 MED ORDER — GLYCOPYRROLATE 0.2 MG/ML IJ SOLN
0.2000 mg | Freq: Once | INTRAMUSCULAR | Status: AC
  Administered 2011-07-08: 0.2 mg via INTRAVENOUS

## 2011-07-08 MED ORDER — MIDAZOLAM HCL 2 MG/2ML IJ SOLN
1.0000 mg | INTRAMUSCULAR | Status: DC | PRN
  Administered 2011-07-08: 2 mg via INTRAVENOUS

## 2011-07-08 MED ORDER — BUPIVACAINE-EPINEPHRINE 0.5% -1:200000 IJ SOLN
INTRAMUSCULAR | Status: DC | PRN
  Administered 2011-07-08: 30 mL

## 2011-07-08 MED ORDER — HYDROCODONE-ACETAMINOPHEN 7.5-325 MG PO TABS: 1.0000 | ORAL_TABLET | ORAL | Status: AC | PRN

## 2011-07-08 MED ORDER — LACTATED RINGERS IV SOLN
INTRAVENOUS | Status: DC
  Administered 2011-07-08: 1500 mL via INTRAVENOUS

## 2011-07-08 MED ORDER — DEXAMETHASONE SODIUM PHOSPHATE 4 MG/ML IJ SOLN
INTRAMUSCULAR | Status: AC
  Filled 2011-07-08: qty 1

## 2011-07-08 MED ORDER — ACETAMINOPHEN 325 MG PO TABS: 325.0000 mg | ORAL_TABLET | ORAL | Status: DC | PRN

## 2011-07-08 MED ORDER — BUPIVACAINE-EPINEPHRINE PF 0.5-1:200000 % IJ SOLN
INTRAMUSCULAR | Status: AC
  Filled 2011-07-08: qty 10

## 2011-07-08 MED ORDER — PROMETHAZINE HCL 12.5 MG PO TABS: 12.5000 mg | ORAL_TABLET | Freq: Four times a day (QID) | ORAL | Status: DC | PRN

## 2011-07-08 MED ORDER — FENTANYL CITRATE 0.05 MG/ML IJ SOLN
INTRAMUSCULAR | Status: AC
  Administered 2011-07-08: 50 ug via INTRAVENOUS
  Filled 2011-07-08: qty 5

## 2011-07-08 MED ORDER — HYDROCODONE-ACETAMINOPHEN 7.5-325 MG PO TABS: 1.0000 | ORAL_TABLET | Freq: Four times a day (QID) | ORAL | Status: DC | PRN

## 2011-07-08 SURGICAL SUPPLY — 58 items
ANCHOR SUT CORKSCREW 3.5X12 (Anchor) ×2 IMPLANT
BAG HAMPER (MISCELLANEOUS) ×2 IMPLANT
BANDAGE ELASTIC 4 VELCRO NS (GAUZE/BANDAGES/DRESSINGS) ×4 IMPLANT
BANDAGE ELASTIC 6 VELCRO NS (GAUZE/BANDAGES/DRESSINGS) IMPLANT
BANDAGE ESMARK 4X12 BL STRL LF (DISPOSABLE) ×1 IMPLANT
BLADE SURG SZ10 CARB STEEL (BLADE) ×2 IMPLANT
BNDG COHESIVE 4X5 TAN NS LF (GAUZE/BANDAGES/DRESSINGS) IMPLANT
BNDG ESMARK 4X12 BLUE STRL LF (DISPOSABLE) ×2
CHLORAPREP W/TINT 26ML (MISCELLANEOUS) ×2 IMPLANT
CLOTH BEACON ORANGE TIMEOUT ST (SAFETY) ×2 IMPLANT
COVER LIGHT HANDLE STERIS (MISCELLANEOUS) ×4 IMPLANT
CUFF TOURNIQUET SINGLE 18IN (TOURNIQUET CUFF) ×2 IMPLANT
DECANTER SPIKE VIAL GLASS SM (MISCELLANEOUS) ×2 IMPLANT
DRAPE PROXIMA HALF (DRAPES) ×4 IMPLANT
DRSG XEROFORM 1X8 (GAUZE/BANDAGES/DRESSINGS) IMPLANT
ELECT REM PT RETURN 9FT ADLT (ELECTROSURGICAL) ×2
ELECTRODE REM PT RTRN 9FT ADLT (ELECTROSURGICAL) ×1 IMPLANT
GAUZE XEROFORM 5X9 LF (GAUZE/BANDAGES/DRESSINGS) ×2 IMPLANT
GLOVE BIOGEL PI IND STRL 7.0 (GLOVE) ×1 IMPLANT
GLOVE BIOGEL PI INDICATOR 7.0 (GLOVE) ×1
GLOVE ECLIPSE 6.5 STRL STRAW (GLOVE) ×4 IMPLANT
GLOVE EXAM NITRILE MD LF STRL (GLOVE) ×2 IMPLANT
GLOVE INDICATOR 7.0 STRL GRN (GLOVE) ×4 IMPLANT
GLOVE SKINSENSE NS SZ6.5 (GLOVE) ×1
GLOVE SKINSENSE NS SZ8.0 LF (GLOVE) ×1
GLOVE SKINSENSE STRL SZ6.5 (GLOVE) ×1 IMPLANT
GLOVE SKINSENSE STRL SZ8.0 LF (GLOVE) ×1 IMPLANT
GLOVE SS N UNI LF 8.5 STRL (GLOVE) ×2 IMPLANT
GOWN STRL REIN XL XLG (GOWN DISPOSABLE) ×8 IMPLANT
INST SET MINOR BONE (KITS) ×2 IMPLANT
KIT ROOM TURNOVER APOR (KITS) ×2 IMPLANT
MANIFOLD NEPTUNE II (INSTRUMENTS) ×2 IMPLANT
NEEDLE HYPO 21X1.5 SAFETY (NEEDLE) ×2 IMPLANT
NEEDLE MAYO 6 CRC TAPER PT (NEEDLE) ×2 IMPLANT
NS IRRIG 1000ML POUR BTL (IV SOLUTION) ×2 IMPLANT
PACK BASIC LIMB (CUSTOM PROCEDURE TRAY) ×2 IMPLANT
PAD ABD 5X9 TENDERSORB (GAUZE/BANDAGES/DRESSINGS) ×2 IMPLANT
PAD ARMBOARD 7.5X6 YLW CONV (MISCELLANEOUS) ×2 IMPLANT
PAD CAST 4YDX4 CTTN HI CHSV (CAST SUPPLIES) ×1 IMPLANT
PADDING CAST COTTON 4X4 STRL (CAST SUPPLIES) ×1
SET BASIN LINEN APH (SET/KITS/TRAYS/PACK) ×2 IMPLANT
SPLINT IMMOBILIZER J 3INX20FT (CAST SUPPLIES)
SPLINT J IMMOBILIZER 3X20FT (CAST SUPPLIES) IMPLANT
SPONGE GAUZE 4X4 12PLY (GAUZE/BANDAGES/DRESSINGS) ×2 IMPLANT
STAPLER VISISTAT 35W (STAPLE) ×2 IMPLANT
STRIP CLOSURE SKIN 1/2X4 (GAUZE/BANDAGES/DRESSINGS) IMPLANT
SUT ETHIBOND 2 V 37 (SUTURE) ×2 IMPLANT
SUT ETHILON 3 0 FSL (SUTURE) IMPLANT
SUT MON AB 2-0 SH 27 (SUTURE) ×1
SUT MON AB 2-0 SH27 (SUTURE) ×1 IMPLANT
SUT PROLENE 3 0 PS 1 (SUTURE) ×2 IMPLANT
SUT VIC AB 1 CT1 27 (SUTURE)
SUT VIC AB 1 CT1 27XBRD ANTBC (SUTURE) IMPLANT
SUT VICRYL AB 3-0 FS1 BRD 27IN (SUTURE) IMPLANT
SYR 30ML LL (SYRINGE) ×2 IMPLANT
SYR BULB IRRIGATION 50ML (SYRINGE) ×2 IMPLANT
SYR CONTROL 10ML LL (SYRINGE) ×2 IMPLANT
TOWEL OR 17X26 4PK STRL BLUE (TOWEL DISPOSABLE) ×2 IMPLANT

## 2011-07-08 NOTE — Brief Op Note (Signed)
07/08/2011  8:37 AM  PATIENT:  Brady [name]  41 y.o. male  PRE-OPERATIVE DIAGNOSIS:  left tennis elbow  POST-OPERATIVE DIAGNOSIS:  left tennis elbow  PROCEDURE:  Procedure(s) (LRB): TENNIS ELBOW RELEASE (Left)  SURGEON:  Surgeon(s) and Role:    * Vickki Hearing, MD - Primary  PHYSICIAN ASSISTANT:   ASSISTANTS: vicki melvin   ANESTHESIA:   general  EBL:  Total I/O In: 200 [I.V.:200] Out: -   BLOOD ADMINISTERED:none  DRAINS: none   LOCAL MEDICATIONS USED:  MARCAINE   , Amount: 30 ml and OTHER epi  SPECIMEN:  No Specimen  DISPOSITION OF SPECIMEN:  N/A  COUNTS:  YES  TOURNIQUET:   Total Tourniquet Time Documented: Upper Arm (Left) - 29 minutes  DICTATION: .Reubin Milan Dictation  PLAN OF CARE: Discharge to home after PACU  PATIENT DISPOSITION:  PACU - hemodynamically stable.   Delay start of Pharmacological VTE agent (>24hrs) due to surgical blood loss or risk of bleeding: not applicable

## 2011-07-08 NOTE — Anesthesia Postprocedure Evaluation (Signed)
  Anesthesia Post-op Note  Patient: Brady [name]  Procedure(s) Performed: Procedure(s) (LRB): TENNIS ELBOW RELEASE (Left)  Patient Location: PACU  Anesthesia Type: General  Level of Consciousness: awake, alert  and oriented  Airway and Oxygen Therapy: Patient Spontanous Breathing and Patient connected to face mask oxygen  Post-op Pain: mild  Post-op Assessment: Post-op Vital signs reviewed, Patient's Cardiovascular Status Stable, Respiratory Function Stable, Patent Airway and No signs of Nausea or vomiting  Post-op Vital Signs: Reviewed and stable  Complications: No apparent anesthesia complications

## 2011-07-08 NOTE — Interval H&P Note (Signed)
History and Physical Interval Note:  07/08/2011 7:29 AM  Brady [name]  has presented today for surgery, with the diagnosis of left tennis elbow  The various methods of treatment have been discussed with the patient and family. After consideration of risks, benefits and other options for treatment, the patient has consented to  Procedure(s) (LRB): TENNIS ELBOW RELEASE (Left) as a surgical intervention .  The patients' history has been reviewed, patient examined, no change in status, stable for surgery.  I have reviewed the patients' chart and labs.  Questions were answered to the patient's satisfaction.     Fuller Canada

## 2011-07-08 NOTE — Discharge Instructions (Signed)
Wear sling  Move fingers 10 x per hour by opening and closing the hand

## 2011-07-08 NOTE — Op Note (Signed)
Date of surgery 07/08/2011  Indications chronic left elbow pain which failed nonoperative treatment.  Preop diagnosis left tennis elbow  Postoperative diagnosis left tennis elbow Procedure left tennis elbow release and reattachment Surgeon Romeo Apple Anesthetic Gen. 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 The chart was reviewed and the surgical site was countersigned over the patient's initials. The patient was then taken to the operating room and vancomycin was started secondary to penicillin allergy  After successful LMA anesthesia the left arm was prepped and draped with sterile technique. An armboard and platelets were used to position the arm.  After sterile prep and drape the timeout procedure was executed.  A curvilinear incision was made over the lateral epicondyle extended proximally and distally. Subcutaneous tissue was divided bluntly and the extensor tendons  were exposed. The ECRB was released from the bone and a Fredrik Cove was used to create a bleeding bone bed. A 3.5 Arthrex suture anchor was placed and the ECRB and a portion of the ECRL was reattached to the elbow.  The wound was irrigated and closed with 2-0 Monocryl in running fashion and staples.  30 cc of Marcaine with epinephrine was injected.  A sterile dressing was applied the tourniquet was released the patient was extubated and taken to recovery room in stable condition  A followup visit will be on Monday  The postoperative plan is for 2 weeks of general rest in a sling and then active range of motion can be started until 6 weeks postop. At that time strengthening exercises can begin for another 6 weeks

## 2011-07-08 NOTE — Anesthesia Preprocedure Evaluation (Signed)
Anesthesia Evaluation  Patient identified by MRN, date of birth, ID band Patient awake    Reviewed: Allergy & Precautions, H&P , NPO status , Patient's Chart, lab work & pertinent test results  Airway Mallampati: II TM Distance: >3 FB Neck ROM: Full    Dental No notable dental hx.    Pulmonary neg pulmonary ROS,    Pulmonary exam normal       Cardiovascular + Valvular Problems/Murmurs MVP Rhythm:Regular Rate:Normal     Neuro/Psych negative neurological ROS  negative psych ROS   GI/Hepatic negative GI ROS, Neg liver ROS,   Endo/Other  negative endocrine ROS  Renal/GU negative Renal ROS     Musculoskeletal negative musculoskeletal ROS (+)   Abdominal Normal abdominal exam  (+)   Peds  Hematology negative hematology ROS (+)   Anesthesia Other Findings   Reproductive/Obstetrics                           Anesthesia Physical Anesthesia Plan  ASA: II  Anesthesia Plan: General   Post-op Pain Management:    Induction: Intravenous  Airway Management Planned: LMA  Additional Equipment:   Intra-op Plan:   Post-operative Plan: Extubation in OR  Informed Consent: I have reviewed the patients History and Physical, chart, labs and discussed the procedure including the risks, benefits and alternatives for the proposed anesthesia with the patient or authorized representative who has indicated his/her understanding and acceptance.     Plan Discussed with: CRNA  Anesthesia Plan Comments:         Anesthesia Quick Evaluation

## 2011-07-08 NOTE — Anesthesia Procedure Notes (Signed)
Procedure Name: LMA Insertion Date/Time: 07/08/2011 7:49 AM Performed by: Glynn Octave E Pre-anesthesia Checklist: Patient identified, Patient being monitored, Emergency Drugs available, Timeout performed and Suction available Patient Re-evaluated:Patient Re-evaluated prior to inductionOxygen Delivery Method: Circle System Utilized Preoxygenation: Pre-oxygenation with 100% oxygen Intubation Type: IV induction Ventilation: Mask ventilation without difficulty LMA: LMA inserted LMA Size: 5.0 Number of attempts: 1 Placement Confirmation: positive ETCO2 and breath sounds checked- equal and bilateral

## 2011-07-08 NOTE — Transfer of Care (Signed)
Immediate Anesthesia Transfer of Care Note  Patient: Brady [name]  Procedure(s) Performed: Procedure(s) (LRB): TENNIS ELBOW RELEASE (Left)  Patient Location: PACU  Anesthesia Type: General  Level of Consciousness: awake, alert  and oriented  Airway & Oxygen Therapy: Patient Spontanous Breathing and Patient connected to face mask oxygen  Post-op Assessment: Report given to PACU RN  Post vital signs: Reviewed and stable  Complications: No apparent anesthesia complications

## 2011-07-11 ENCOUNTER — Encounter (HOSPITAL_COMMUNITY): Payer: Self-pay | Admitting: Orthopedic Surgery

## 2011-07-11 ENCOUNTER — Ambulatory Visit (INDEPENDENT_AMBULATORY_CARE_PROVIDER_SITE_OTHER): Payer: 59 | Admitting: Orthopedic Surgery

## 2011-07-11 VITALS — BP 132/80 | Ht 75.0 in | Wt 252.0 lb

## 2011-07-11 DIAGNOSIS — M7710 Lateral epicondylitis, unspecified elbow: Secondary | ICD-10-CM

## 2011-07-11 NOTE — Patient Instructions (Addendum)
Shower ok  Gentle ROM exercises   Also will return on 28th

## 2011-07-11 NOTE — Progress Notes (Signed)
Patient ID: Brady [name], male   DOB: 02/20/1971, 41 y.o.   MRN: [id number] Chief Complaint  Patient presents with  . Routine Post Op    post op1, left elbow, DOS 07/08/11   07/08/2011  8:37 AM  PATIENT: Brady [name] 41 y.o. male  PRE-OPERATIVE DIAGNOSIS: left tennis elbow  POST-OPERATIVE DIAGNOSIS: left tennis elbow  PROCEDURE: Procedure(s) (LRB):  TENNIS ELBOW RELEASE (Left)  Clean wound  neurovascular exam normal   Staples out 07/18/2011  Then check 5/28

## 2011-07-26 ENCOUNTER — Encounter: Payer: Self-pay | Admitting: Orthopedic Surgery

## 2011-07-26 ENCOUNTER — Ambulatory Visit (INDEPENDENT_AMBULATORY_CARE_PROVIDER_SITE_OTHER): Payer: 59 | Admitting: Orthopedic Surgery

## 2011-07-26 VITALS — BP 124/80 | Ht 75.0 in | Wt 252.0 lb

## 2011-07-26 DIAGNOSIS — M7710 Lateral epicondylitis, unspecified elbow: Secondary | ICD-10-CM

## 2011-07-26 NOTE — Progress Notes (Signed)
Patient ID: Brady [name], male   DOB: 12/31/1970, 41 y.o.   MRN: [id number] Chief Complaint  Patient presents with  . Follow-up    1 week recheck left elbow, DOS 07/08/11   Wound clean   No pain   FROM   F/U 4 weeks

## 2011-07-26 NOTE — Patient Instructions (Signed)
Continue light range of motion exercises for the next 4 weeks   Do lift anything heavy, limit it to 5 lbs

## 2011-08-23 ENCOUNTER — Encounter: Payer: Self-pay | Admitting: Orthopedic Surgery

## 2011-08-23 ENCOUNTER — Ambulatory Visit (INDEPENDENT_AMBULATORY_CARE_PROVIDER_SITE_OTHER): Payer: 59 | Admitting: Orthopedic Surgery

## 2011-08-23 VITALS — BP 120/90 | Ht 75.0 in | Wt 252.0 lb

## 2011-08-23 DIAGNOSIS — M7710 Lateral epicondylitis, unspecified elbow: Secondary | ICD-10-CM

## 2011-08-23 NOTE — Patient Instructions (Signed)
Start strengthening exercises and do for 6 weeks   Call us if you have any problems

## 2011-08-23 NOTE — Progress Notes (Signed)
Patient ID: Brady [name], male   DOB: 1970-06-08, 41 y.o.   MRN: [id number] Chief Complaint  Patient presents with  . Follow-up    4 week recheck on left elbow.    BP 120/90  Ht 6\' 3"  (1.905 m)  Wt 252 lb (114.306 kg)  BMI 31.50 kg/m2  6 weeks status post LEFT tennis elbow release.  No complaints of pain just some soreness and tightness. He has full range of motion. He has a negative wrist extension test.  He is advised to perform the super 7 exercises over the next 6 weeks. Follow up as needed. Call if any problems

## 2011-10-20 ENCOUNTER — Ambulatory Visit (HOSPITAL_BASED_OUTPATIENT_CLINIC_OR_DEPARTMENT_OTHER): Payer: 59

## 2011-11-10 ENCOUNTER — Encounter (HOSPITAL_BASED_OUTPATIENT_CLINIC_OR_DEPARTMENT_OTHER): Payer: 59

## 2012-04-14 ENCOUNTER — Other Ambulatory Visit: Payer: Self-pay

## 2012-08-30 ENCOUNTER — Encounter: Payer: Self-pay | Admitting: Family Medicine

## 2012-08-30 ENCOUNTER — Ambulatory Visit (INDEPENDENT_AMBULATORY_CARE_PROVIDER_SITE_OTHER): Payer: 59 | Admitting: Family Medicine

## 2012-08-30 VITALS — BP 138/92 | Temp 97.8°F | Wt 276.0 lb

## 2012-08-30 DIAGNOSIS — A630 Anogenital (venereal) warts: Secondary | ICD-10-CM

## 2012-08-30 DIAGNOSIS — J329 Chronic sinusitis, unspecified: Secondary | ICD-10-CM

## 2012-08-30 MED ORDER — PODOFILOX 0.5 % EX SOLN: Freq: Two times a day (BID) | CUTANEOUS | Status: DC

## 2012-08-30 MED ORDER — FLUTICASONE PROPIONATE 50 MCG/ACT NA SUSP: 1.0000 | Freq: Every day | NASAL | Status: DC

## 2012-08-30 MED ORDER — CLARITHROMYCIN 500 MG PO TABS: 500.0000 mg | ORAL_TABLET | Freq: Two times a day (BID) | ORAL | Status: AC

## 2012-08-30 NOTE — Progress Notes (Signed)
  Subjective:    Patient ID: Brady [name], male    DOB: 29-Oct-1970, 42 y.o.   MRN: [id number]  Sinusitis This is a new problem. The current episode started 1 to 4 weeks ago. The problem has been gradually worsening since onset. His pain is at a severity of 4/10. The pain is moderate. Associated symptoms include congestion, coughing, headaches, a hoarse voice and sinus pressure. Past treatments include spray decongestants. The treatment provided moderate relief.    No sig cong in chest. Mostly on right. Shifts from side to sdide. Gunk, yellow bloody disch.  Using allegra and tyl sinus, mucinex prn  Review of Systems  HENT: Positive for congestion, hoarse voice and sinus pressure.   Respiratory: Positive for cough.   Neurological: Positive for headaches.       Objective:   Physical Exam Alert no acute distress. Vitals stable. HEENT moderate nasal congestion. Frontal tenderness. Pharynx erythematous neck supple. Lungs rare rhonchi heart rare rhythm. 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 #2 sinusitis. Plan Condylox solution apply as directed. Urology consult. Biaxin 500 twice a day 10 days. Symptomatic care discussed. WSL

## 2012-09-03 ENCOUNTER — Encounter: Payer: Self-pay | Admitting: *Deleted

## 2012-09-10 ENCOUNTER — Telehealth: Payer: Self-pay | Admitting: Family Medicine

## 2012-09-10 NOTE — Telephone Encounter (Signed)
Patient states he no longer has problem that he was seen for on August 30, 2012.  Wants to know if Dr. Brett Canales would still would like for him to see the Urologist?  Please call Patient.  Thanks

## 2012-09-10 NOTE — Telephone Encounter (Signed)
Left message on voicemail notifying patient we will cancel referral.

## 2012-09-10 NOTE — Telephone Encounter (Signed)
We can hold off on ref. Let pt and brendale know

## 2012-09-11 NOTE — Telephone Encounter (Signed)
Referral has already been done.  I called and LMOVM for pt, he needs to call to cancel his appointment, gave phone number to [facility] so that he can call to cancel.

## 2013-01-01 ENCOUNTER — Other Ambulatory Visit: Payer: Self-pay | Admitting: Family Medicine

## 2013-01-03 ENCOUNTER — Other Ambulatory Visit: Payer: Self-pay

## 2013-04-24 ENCOUNTER — Ambulatory Visit (INDEPENDENT_AMBULATORY_CARE_PROVIDER_SITE_OTHER): Payer: 59 | Admitting: Family Medicine

## 2013-04-24 ENCOUNTER — Encounter: Payer: Self-pay | Admitting: Family Medicine

## 2013-04-24 VITALS — BP 124/88 | Ht 75.0 in | Wt 283.0 lb

## 2013-04-24 DIAGNOSIS — M778 Other enthesopathies, not elsewhere classified: Secondary | ICD-10-CM

## 2013-04-24 DIAGNOSIS — M65839 Other synovitis and tenosynovitis, unspecified forearm: Secondary | ICD-10-CM

## 2013-04-24 DIAGNOSIS — M65849 Other synovitis and tenosynovitis, unspecified hand: Secondary | ICD-10-CM

## 2013-04-24 MED ORDER — ETODOLAC 400 MG PO TABS: 400.0000 mg | ORAL_TABLET | Freq: Two times a day (BID) | ORAL | Status: DC

## 2013-04-24 NOTE — Progress Notes (Signed)
   Subjective:    Patient ID: Brady [name], male    DOB: 1970/12/27, 43 y.o.   MRN: [id number]  Wrist Pain  The pain is present in the right wrist. This is a recurrent (Had carpal tunnel 12 years ago) problem. Episode onset: 2 weeks ago. There has been no history of extremity trauma. The problem occurs constantly. The pain is moderate. Associated symptoms include joint locking and joint swelling. The symptoms are aggravated by contact and activity. He has tried NSAIDS (Brace) for the symptoms. The treatment provided no relief.   Swollen lat wrist off and on Acting up and,  No exerising, some cmpd bow use  History of carpal tunnel syndrome. Had surgery. This pain really does not feel like that. No weakness and hand. No numbness or tingling. Just significant for lateral wrist swelling and pain and tenderness.  Trying to use a new mouse     Review of Systems No joint pain elsewhere no sudden injury no chest pain no back pain no cough ROS otherwise    Objective:   Physical Exam  Alert no apparent distress. Lungs clear. Heart regular in rhythm. Lateral wrist tenderness and swelling. Good range of motion. Diffuse prominence over ulnar prominence      Assessment & Plan:  Impression tendinitis wrist plan short wrist splint. Lodine twice a day with food. Measures discussed. No x-rays rationale discussed. WSL

## 2013-06-11 ENCOUNTER — Encounter: Payer: Self-pay | Admitting: Family Medicine

## 2013-06-11 ENCOUNTER — Ambulatory Visit (INDEPENDENT_AMBULATORY_CARE_PROVIDER_SITE_OTHER): Payer: 59 | Admitting: Family Medicine

## 2013-06-11 VITALS — BP 128/80 | Temp 98.8°F | Ht 75.0 in | Wt 280.0 lb

## 2013-06-11 DIAGNOSIS — W57XXXA Bitten or stung by nonvenomous insect and other nonvenomous arthropods, initial encounter: Secondary | ICD-10-CM

## 2013-06-11 DIAGNOSIS — R21 Rash and other nonspecific skin eruption: Secondary | ICD-10-CM

## 2013-06-11 DIAGNOSIS — T148 Other injury of unspecified body region: Secondary | ICD-10-CM

## 2013-06-11 MED ORDER — DOXYCYCLINE HYCLATE 100 MG PO TABS: 100.0000 mg | ORAL_TABLET | Freq: Two times a day (BID) | ORAL | Status: DC

## 2013-06-11 NOTE — Progress Notes (Signed)
   Subjective:    Patient ID: Brady [name], male    DOB: 07-20-1970, 43 y.o.   MRN: [id number]  HPITick bite on left thigh. Pulled tick off Saturday. Area started getting red and itchy on Monday.   Itchy in the past  This one red and tender and hot to touch  No other rash or headache or fever or no new arthralgies  antihist cream and itch  No high fevers none measure  Review of Systems    no vomiting no diarrhea no rash elsewhere ROS otherwise negative Objective:   Physical Exam   Alert good hydration. Lungs clear. Heart rare rhythm. H&T normal. Flank reveals an impressive large erythematous macule at site of tick bite. Nontender nonparetic.     Assessment & Plan:  Impression probable allergic reaction the tick bite discussed at length plan Doxy 100 twice a day 10 days to cover any tickborne illness. Unlikely though theoretically possible discussed. WSL

## 2013-10-30 ENCOUNTER — Encounter: Payer: Self-pay | Admitting: Family Medicine

## 2013-10-30 ENCOUNTER — Ambulatory Visit (INDEPENDENT_AMBULATORY_CARE_PROVIDER_SITE_OTHER): Payer: 59 | Admitting: Family Medicine

## 2013-10-30 VITALS — BP 128/80 | Temp 98.4°F | Ht 75.0 in | Wt 279.0 lb

## 2013-10-30 DIAGNOSIS — J301 Allergic rhinitis due to pollen: Secondary | ICD-10-CM

## 2013-10-30 DIAGNOSIS — J019 Acute sinusitis, unspecified: Secondary | ICD-10-CM

## 2013-10-30 MED ORDER — FLUTICASONE PROPIONATE 50 MCG/ACT NA SUSP: NASAL | Status: DC

## 2013-10-30 MED ORDER — LEVOFLOXACIN 500 MG PO TABS: 500.0000 mg | ORAL_TABLET | Freq: Every day | ORAL | Status: DC

## 2013-10-30 NOTE — Progress Notes (Signed)
   Subjective:    Patient ID: Brady [name], male    DOB: 1970-12-03, 43 y.o.   MRN: [id number]  Cough This is a new problem. The current episode started in the past 7 days. Associated symptoms include headaches. Associated symptoms comments: Runny nose.   Requesting refill on flonase. Patient does have history of allergies as well  Review of Systems  Respiratory: Positive for cough.   Neurological: Positive for headaches.       Objective:   Physical Exam  Lungs are clear heart is regular moderate sinus tenderness eardrums normal neck supple      Assessment & Plan:  Sinusitis antibiotics prescribed warning signs discussed followup and ongoing trouble no sign of any complication

## 2013-11-05 ENCOUNTER — Other Ambulatory Visit: Payer: Self-pay | Admitting: Family Medicine

## 2013-11-05 ENCOUNTER — Encounter: Payer: Self-pay | Admitting: Family Medicine

## 2013-11-05 MED ORDER — DOXYCYCLINE HYCLATE 100 MG PO CAPS: 100.0000 mg | ORAL_CAPSULE | Freq: Two times a day (BID) | ORAL | Status: DC

## 2014-01-25 ENCOUNTER — Telehealth: Payer: 59 | Admitting: Nurse Practitioner

## 2014-01-25 DIAGNOSIS — J209 Acute bronchitis, unspecified: Secondary | ICD-10-CM

## 2014-01-25 MED ORDER — BENZONATATE 100 MG PO CAPS: 100.0000 mg | ORAL_CAPSULE | Freq: Two times a day (BID) | ORAL | Status: DC | PRN

## 2014-01-25 MED ORDER — AZITHROMYCIN 250 MG PO TABS: ORAL_TABLET | ORAL | Status: DC

## 2014-01-25 NOTE — Progress Notes (Signed)
We are sorry that you are not feeling well.  Here is how we plan to help!  Based on what you have shared with me it looks like you have upper respiratory tract inflammation that has resulted in a signification cough.  Inflammation and infection in the upper respiratory tract is commonly called bronchitis and has four common causes:  Allergies, Viral Infections, Acid Reflux and Bacterial Infections.  Allergies, viruses and acid reflux are treated by controlling symptoms or eliminating the cause. An example might be a cough caused by taking certain blood pressure medications. You stop the cough by changing the medication. Another example might be a cough caused by acid reflux. Controlling the reflux helps control the cough.  Based on your presentation I believe you most likely have A cough due to bacteria.  When patients have a fever and a productive cough with a change in color or increased sputum production, we are concerned about bacterial bronchitis.  If left untreated it can progress to pneumonia.  If your symptoms do not improve with your treatment plan it is important that you contact your provider.   I have prescribed Azithromyin 250 mg: two tables now and then one tablet daily for 4 additonal days   In addition you may use A prescription cough medication called Tessalon Perles 100mg. You may take 1-2 capsules every 8 hours as needed for your cough.    HOME CARE . Only take medications as instructed by your medical team. . Complete the entire course of an antibiotic. . Drink plenty of fluids and get plenty of rest. . Avoid close contacts especially the very young and the elderly . Cover your mouth if you cough or cough into your sleeve. . Always remember to wash your hands . A steam or ultrasonic humidifier can help congestion.    GET HELP RIGHT AWAY IF: . You develop worsening fever. . You become short of breath . You cough up blood. . Your symptoms persist after you have completed your  treatment plan MAKE SURE YOU   Understand these instructions.  Will watch your condition.  Will get help right away if you are not doing well or get worse.  Your e-visit answers were reviewed by a board certified advanced clinical practitioner to complete your personal care plan.  Depending on the condition, your plan could have included both over the counter or prescription medications.  Please review your pharmacy choice.  If there is a problem, you may call our nursing hot line at [phone] and have the prescription routed to another pharmacy.  Your safety is important to us.  If you have drug allergies check your prescription carefully.    You can use MyChart to ask questions about today's visit, request a non-urgent call back, or ask for a work or school excuse.  You will get an e-mail in the next two days asking about your experience.  I hope that your e-visit has been valuable and will speed your recovery. Thank you for using e-visits.   

## 2014-02-25 ENCOUNTER — Encounter: Payer: Self-pay | Admitting: Family Medicine

## 2014-02-25 ENCOUNTER — Ambulatory Visit (INDEPENDENT_AMBULATORY_CARE_PROVIDER_SITE_OTHER): Payer: 59 | Admitting: Family Medicine

## 2014-02-25 VITALS — BP 128/80 | Temp 98.3°F | Ht 75.0 in | Wt 284.0 lb

## 2014-02-25 DIAGNOSIS — J310 Chronic rhinitis: Secondary | ICD-10-CM

## 2014-02-25 DIAGNOSIS — J329 Chronic sinusitis, unspecified: Secondary | ICD-10-CM

## 2014-02-25 DIAGNOSIS — M7711 Lateral epicondylitis, right elbow: Secondary | ICD-10-CM

## 2014-02-25 MED ORDER — LEVOFLOXACIN 500 MG PO TABS: 500.0000 mg | ORAL_TABLET | Freq: Every day | ORAL | Status: AC

## 2014-02-25 NOTE — Progress Notes (Signed)
   Subjective:    Patient ID: Brady [name], male    DOB: November 03, 1970, 43 y.o.   MRN: [id number]  HPI Patient is here today d/t elbow pain (right).  He had surgery on his left elbow about 3 years ago for tennis elbow.  He overused his right elbow about 2 months ago when winterizing his boat. It is still painful today.  Also c/o of cough with postnasal drip. Did an E-visit on 11/28 and was prescribed z-pak and tessalon. He is done with the both of those and still having drainage and cough.nasal drainage and dischare , z pk helped a bit, did not completely knowck it ou.   Notes frontal headache. Increase pressure when he leans forward. Right arm flaring up the last couple months, wearing strap, exercising more at the gym acting it up too    Review of Systems No chest pain no back pain no cough no other joint pain ROS otherwise negative    Objective:   Physical Exam Alert mild malaise vital stable HEENT mom his congestion frontal tenderness pharynx erythematous neck supple. Lungs clear. Heart rare rhythm. Right lateral elbow very tender at lateral epicondyle  Procedure note patient was prepped draped injected with 1/2 mL Depo-Medrol and 1/2 mL plain Xylocaine.       Assessment & Plan:  Impression 1 rhinosinusitis discussed #2 lateral epicondylitis local management discussed. Plan antibiotics prescribed. Symptomatic care discussed. Range of motion exercises for elbow. Recheck if persists. WSL

## 2014-04-22 MED ORDER — METHYLPREDNISOLONE ACETATE 40 MG/ML IJ SUSP: 40.0000 mg | Freq: Once | INTRAMUSCULAR | Status: DC

## 2014-06-09 ENCOUNTER — Encounter: Payer: Self-pay | Admitting: Sports Medicine

## 2014-06-09 ENCOUNTER — Ambulatory Visit (HOSPITAL_COMMUNITY)
Admission: RE | Admit: 2014-06-09 | Discharge: 2014-06-09 | Disposition: A | Discharge status: Home / Self Care | Payer: 59 | Source: Ambulatory Visit | Attending: Sports Medicine | Admitting: Sports Medicine

## 2014-06-09 ENCOUNTER — Ambulatory Visit (INDEPENDENT_AMBULATORY_CARE_PROVIDER_SITE_OTHER): Payer: 59 | Admitting: Sports Medicine

## 2014-06-09 VITALS — BP 155/90 | HR 82 | Ht 75.0 in | Wt 250.0 lb

## 2014-06-09 DIAGNOSIS — M7711 Lateral epicondylitis, right elbow: Secondary | ICD-10-CM | POA: Diagnosis not present

## 2014-06-09 DIAGNOSIS — M25521 Pain in right elbow: Secondary | ICD-10-CM | POA: Insufficient documentation

## 2014-06-09 MED ORDER — NITROGLYCERIN 0.2 MG/HR TD PT24: MEDICATED_PATCH | TRANSDERMAL | Status: DC

## 2014-06-09 NOTE — Progress Notes (Signed)
   Subjective:    Patient ID: Brady [name], male    DOB: 1970/06/02, 44 y.o.   MRN: [id number]  HPI chief complaint: Right elbow pain  44 year old ambidextrous male comes in today complaining of lateral right elbow pain. Pain initially began in November of last year without any specific injury. He saw his primary care physician and received a cortisone injection which resulted in symptom resolution for a brief period of time. About a month ago he was pulling back on his compound bow and he felt a pop in the lateral elbow. Since then he has had persistent pain and swelling. He localizes all of his pain to the lateral elbow. He does have a history of a rather significant elbow fracture at the age of 44. This was treated via ORIF. He has had intermittent catching and popping in his elbow since then but the lateral elbow pain is new. He has a history of left elbow common extensor tendon debridement for tennis elbow. This was done by Dr. Aline Brochure in 2013 and he has had a very good postoperative outcome. For his right elbow he has been utilizing ice and ibuprofen with minimal symptom improvement. His pain is present primarily with grip or with lifting heavy objects. Pain at times will radiate into his forearm. He denies associated numbness or tingling.  Past medical history reviewed Past surgical history is as above. He also has a history of right wrist carpal tunnel release. Medications reviewed Allergies reviewed    Review of Systems    as above Objective:   Physical Exam  Well-developed, well-nourished. No acute distress. Awake alert and oriented 3. Vital signs reviewed.  Right elbow: Full range of motion. There is tenderness to palpation directly over the lateral epicondyle. Mild soft tissue swelling here as well. No effusion. Reproducible pain with ECRB testing. No tenderness over the medial epicondyle. Negative Tinel's over the cubital tunnel. Reproducible pain with grip.  Left elbow:  Full range of motion. No effusion. No tenderness to palpation. There is a well-healed surgical incision along the lateral elbow consistent with his prior debridement. He is neurologically intact distally.  [facility] ultrasound of the right elbow was performed. Limited images of the lateral elbow were obtained. There is a rather sizable hypoechoic area in the undersurface of the common extensor tendon seen on the [place]. This is consistent with a partial tear of the common extensor tendon.      Assessment & Plan:  Right elbow pain secondary to partial common extensor tendon tear Status post left elbow common extensor tendon debridement done in 44-doing well  I discussed the patient's treatment options with him. He would like to try course of conservative treatment prior to considering surgery. We will start a nitroglycerin protocol and I will have him start physical therapy. He has been wearing a counterforce brace which has not been helpful so he can discontinue this. Given his history of remote trauma I would like to go ahead and get a plain x-ray of his right elbow as well. Patient will follow-up with me in 4 weeks. We will repeat his ultrasound at that time. He understands that if symptoms persist or worsen then we can refer him for surgical debridement. Patient will call with questions or concerns prior to the follow-up visit.

## 2014-06-09 NOTE — Patient Instructions (Signed)

## 2014-06-12 ENCOUNTER — Ambulatory Visit: Payer: 59 | Admitting: Physical Therapy

## 2014-06-19 ENCOUNTER — Encounter: Payer: Self-pay | Admitting: *Deleted

## 2014-06-19 DIAGNOSIS — M7711 Lateral epicondylitis, right elbow: Secondary | ICD-10-CM

## 2014-06-19 NOTE — Patient Instructions (Signed)
[facility] Dr. Charlann Boxer 06/30/14 at 1115am [address] [place] Grayer [place], [place] [address] [phone]

## 2014-06-19 NOTE — Addendum Note (Signed)
Addended by: Cyd Silence on: 06/19/2014 03:42 PM   Modules accepted: Orders

## 2014-06-25 ENCOUNTER — Ambulatory Visit: Payer: 59 | Attending: Sports Medicine

## 2014-06-25 DIAGNOSIS — R6889 Other general symptoms and signs: Secondary | ICD-10-CM | POA: Diagnosis not present

## 2014-06-25 DIAGNOSIS — M6289 Other specified disorders of muscle: Secondary | ICD-10-CM | POA: Diagnosis not present

## 2014-06-25 DIAGNOSIS — M25521 Pain in right elbow: Secondary | ICD-10-CM | POA: Diagnosis present

## 2014-06-25 DIAGNOSIS — R29898 Other symptoms and signs involving the musculoskeletal system: Secondary | ICD-10-CM

## 2014-06-25 NOTE — Therapy (Addendum)
[place], [place], [address] Phone: [phone]   Fax:  [phone]  Physical Therapy Evaluation  Patient Details  Name: Brady [name] MRN: [id number] Date of Birth: [birth year] Referring Provider:  Thurman Coyer, DO  Encounter Date: 06/25/2014      PT End of Session - 06/25/14 0902    Visit Number 1   Number of Visits 12   Date for PT Re-Evaluation 08/06/14   PT Start Time 0820   PT Stop Time 0900   PT Time Calculation (min) 40 min   Activity Tolerance Patient tolerated treatment well   Behavior During Therapy [facility] for tasks assessed/performed      Past Medical History  Diagnosis Date  . Mitral prolapse   . Allergic rhinitis   . IBS (irritable bowel syndrome)   . Anxiety   . Reflux     Past Surgical History  Procedure Laterality Date  . Carpal tunnel release    . Colonoscopy    . Wisdom tooth extraction    . Lateral epicondyle release  07/08/2011    Procedure: TENNIS ELBOW RELEASE;  Surgeon: Carole Civil, MD;  Location: [facility];  Service: Orthopedics;  Laterality: Left;  Left Tennis Elbow Release  . Tonsillectomy      There were no vitals filed for this visit.  Visit Diagnosis:  Pain in right elbow - Plan: PT plan of care cert/re-cert  Weakness of right hand - Plan: PT plan of care cert/re-cert  Activity intolerance - Plan: PT plan of care cert/re-cert      Subjective Assessment - 06/25/14 0829    Subjective RT elbow pain    Pertinent History Started in fall 2015 with reaching to lift boat trailer tongue. Pulling bow to shoot caused incrase pain. Reports bone spur and torn tendon. Using strap around proximal forarm   Limitations --  Using mouse a computer and all use of RT arm   Diagnostic tests [place]   Patient Stated Goals Get rid of the pain.   Currently in Pain? Yes   Pain Score 5    Pain Location Elbow   Pain Orientation Right   Pain Descriptors / Indicators  Aching;Sharp;Stabbing   Pain Type Chronic pain   Pain Onset More than a month ago   Pain Frequency Constant   Aggravating Factors  using RT arm   Pain Relieving Factors Cold, advil   Effect of Pain on Daily Activities unable to use arm for normal selfcar and work tasks   Multiple Pain Sites No            [facility] PT Assessment - 06/25/14 0825    Assessment   Medical Diagnosis RT tennis elbow   Onset Date --  2-3 months   Precautions   Precautions None   Restrictions   Weight Bearing Restrictions No   Balance Screen   Has the patient fallen in the past 6 months No   Has the patient had a decrease in activity level because of a fear of falling?  No   Prior Function   Level of Independence Independent with basic ADLs   Cognition   Overall Cognitive Status Within Functional Limits for tasks assessed   ROM / Strength   AROM / PROM / Strength AROM;Strength   AROM   Overall AROM Comments Normal RT UE motion   Strength   Overall Strength Comments Strength normal but with pain with groups invoving wrist extenison  GRIP  RT average of 3  Rt 83 pounds   LT  113 pounds   Palpation   Palpation Tender radial head and extensor wrist muscles                   [facility] Adult PT Treatment/Exercise - 06/25/14 0001    Manual Therapy   Manual Therapy Massage;Myofascial release;Other (comment)   Other Manual Therapy Kineseotape for pain lateral RT elbow and to wrist with wrist flexed. Advised to remove if irritating of 3-4 days and can get wet with shower.                 PT Education - 06/25/14 0916    Education provided Yes          PT Short Term Goals - 06/25/14 0911    PT SHORT TERM GOAL #1   Title He will be independent with inital HEP   Time 3   Period Weeks   Status New   PT SHORT TERM GOAL #2   Title He will report pain improved 30% or more   Time 3   Period Weeks   Status New   PT SHORT TERM GOAL #3   Title He will increase RT grip strength  to 90 pounds or more   Time 3   Period Weeks   Status New           PT Long Term Goals - 06/25/14 0814    PT LONG TERM GOAL #1   Title He will be able to do all HEP issued as of last visit   Time 6   Period Weeks   Status New   PT LONG TERM GOAL #2   Title He will report pain decreased 50% or more   Period Weeks   Status New   PT LONG TERM GOAL #3   Title He will increase RT grip strength to 100 pound s or more   Time 6   Period Weeks   Status New   PT LONG TERM GOAL #4   Title He will return to using mouse on RT hand for work.    Time 6   Period Weeks   Status New   PT LONG TERM GOAL #5   Title He will report able to pull bow with 1-3/10 max pain   Time 6   Period Weeks   Status New               Plan - 06/25/14 4818    Clinical Impression Statement [name] has normal strength and range except RT hand grip significantly decreased due to pain. He is tender a radial head and sore in wrist extensor muscles. He should benefit from PT   Pt will benefit from skilled therapeutic intervention in order to improve on the following deficits Impaired UE functional use;Pain;Decreased strength   Rehab Potential Good   PT Frequency 2x / week   PT Duration 6 weeks   PT Treatment/Interventions Moist Heat;Cryotherapy;Ultrasound;Therapeutic exercise;Patient/family education;Passive range of motion;Manual techniques;Dry needling;Electrical Stimulation   PT Next Visit Plan [place], STW, advance exercise, retape or ionto if order signed   PT Home Exercise Plan Stretch and strength light weight (can or 2 pound weight)   Consulted and Agree with Plan of Care Patient         Problem List Patient Active Problem List   Diagnosis Date Noted  . Tennis elbow 03/29/2011  . BACK PAIN, LUMBAR 05/23/2008  . PES  PLANUS 05/23/2008    [name] PT 06/25/2014, 9:21 AM  [facility] [address] [place], [place],  [address] Phone: [phone]   Fax:  [phone]    By signing I understand that I am ordering/authorizing the use of Iontophoresis using 4 mg/mL of dexamethasone as a component of this plan of care.

## 2014-06-25 NOTE — Patient Instructions (Signed)
Issued and instructed pt. on stretch and wrist strength exercise 1-2 sets 2x/day with use of heat of cold, X-friction, general wrist flexion stretching

## 2014-06-30 ENCOUNTER — Ambulatory Visit: Payer: 59 | Admitting: Orthopedic Surgery

## 2014-07-02 ENCOUNTER — Ambulatory Visit: Payer: 59 | Attending: Sports Medicine | Admitting: Physical Therapy

## 2014-07-02 DIAGNOSIS — R6889 Other general symptoms and signs: Secondary | ICD-10-CM | POA: Diagnosis not present

## 2014-07-02 DIAGNOSIS — R29898 Other symptoms and signs involving the musculoskeletal system: Secondary | ICD-10-CM

## 2014-07-02 DIAGNOSIS — M25521 Pain in right elbow: Secondary | ICD-10-CM | POA: Diagnosis not present

## 2014-07-02 DIAGNOSIS — M6289 Other specified disorders of muscle: Secondary | ICD-10-CM | POA: Diagnosis not present

## 2014-07-02 NOTE — Patient Instructions (Signed)
Remove tape if irritating,

## 2014-07-02 NOTE — Therapy (Signed)
[place], [place], [address] Phone: [phone]   Fax:  [phone]  Physical Therapy Treatment  Patient Details  Name: Brady [name] MRN: [id number] Date of Birth: October 21, 1970 Referring Provider:  Mikey Kirschner, MD  Encounter Date: 07/02/2014      PT End of Session - 07/02/14 0953    Visit Number 2   Number of Visits 12   Date for PT Re-Evaluation 08/06/14   PT Start Time 0735   PT Stop Time 0806   PT Time Calculation (min) 31 min   Activity Tolerance Patient tolerated treatment well      Past Medical History  Diagnosis Date  . Mitral prolapse   . Allergic rhinitis   . IBS (irritable bowel syndrome)   . Anxiety   . Reflux     Past Surgical History  Procedure Laterality Date  . Carpal tunnel release    . Colonoscopy    . Wisdom tooth extraction    . Lateral epicondyle release  07/08/2011    Procedure: TENNIS ELBOW RELEASE;  Surgeon: Carole Civil, MD;  Location: [facility];  Service: Orthopedics;  Laterality: Left;  Left Tennis Elbow Release  . Tonsillectomy      There were no vitals filed for this visit.  Visit Diagnosis:  Pain in right elbow  Weakness of right hand  Activity intolerance      Subjective Assessment - 07/02/14 0947    Subjective Has been doing exercises and stretches .  They increase pain.  No change in pain with taping noted.   Currently in Pain? Yes   Pain Score 4   still early in the day   Pain Location Elbow   Pain Orientation Right   Pain Descriptors / Indicators Aching;Sharp;Tightness  feels swollen   Pain Radiating Towards lateral elbow   Pain Frequency Constant   Aggravating Factors  work activities, using arm   Pain Relieving Factors medication cold   Effect of Pain on Daily Activities Has to use other arm more if possible   Multiple Pain Sites No                         [facility] Adult PT Treatment/Exercise - 07/02/14 0725    Wrist Exercises   Wrist Flexion Right  , Eccentric lowering with band resistance, Active Extension   Theraband Level (Wrist Flexion) Level 1 (Yellow)                  PT Short Term Goals - 07/02/14 2297    PT SHORT TERM GOAL #1   Title He will be independent with inital HEP   Time 3   Period Weeks   Status Achieved   PT SHORT TERM GOAL #2   Title He will report pain improved 30% or more   Baseline No change except when he does not use   Time 3   Period Weeks   Status On-going   PT SHORT TERM GOAL #3   Title He will increase RT grip strength to 90 pounds or more   Time 3   Period Weeks   Status Unable to assess           PT Long Term Goals - 06/25/14 0911    PT LONG TERM GOAL #1   Title He will be able to do all HEP issued as of last visit   Time 6   Period Weeks  Status New   PT LONG TERM GOAL #2   Title He will report pain decreased 50% or more   Period Weeks   Status New   PT LONG TERM GOAL #3   Title He will increase RT grip strength to 100 pound s or more   Time 6   Period Weeks   Status New   PT LONG TERM GOAL #4   Title He will return to using mouse on RT hand for work.    Time 6   Period Weeks   Status New   PT LONG TERM GOAL #5   Title He will report able to pull bow with 1-3/10 max pain   Time 6   Period Weeks   Status New               Plan - 07/02/14 0956    Clinical Impression Statement Modified taping since typical tape pattern did not work. [place] trial, Pain not incerased post session.  Area edmatous today.  MD order not here yet.   PT Next Visit Plan [place], STW, advance exercise, retape or ionto if order signed   Consulted and Agree with Plan of Care Patient        Problem List Patient Active Problem List   Diagnosis Date Noted  . Tennis elbow 03/29/2011  . BACK PAIN, LUMBAR 05/23/2008  . PES PLANUS 05/23/2008    HARRIS,KAREN 07/02/2014, 10:01 AM  [facility] [address] [place], [place], [address] Phone: [phone]   Fax:  [phone]   Melvenia Needles, PTA 07/02/2014 10:01 AM Phone: [phone] Fax: [phone]

## 2014-07-04 ENCOUNTER — Ambulatory Visit: Payer: 59

## 2014-07-04 DIAGNOSIS — R29898 Other symptoms and signs involving the musculoskeletal system: Secondary | ICD-10-CM

## 2014-07-04 DIAGNOSIS — M25521 Pain in right elbow: Secondary | ICD-10-CM | POA: Diagnosis not present

## 2014-07-04 NOTE — Therapy (Addendum)
[place], [place], [address] Phone: [phone]   Fax:  [phone]  Physical Therapy Treatment  Patient Details  Name: Brady [name] MRN: [id number] Date of Birth: March 21, 1970 Referring Provider:  Mikey Kirschner, MD  Encounter Date: 07/04/2014      PT End of Session - 07/04/14 0759    Visit Number 3   Number of Visits 12   Date for PT Re-Evaluation 08/06/14   PT Start Time 0705   PT Stop Time 0745   PT Time Calculation (min) 40 min   Activity Tolerance Patient tolerated treatment well   Behavior During Therapy [facility] for tasks assessed/performed      Past Medical History  Diagnosis Date  . Mitral prolapse   . Allergic rhinitis   . IBS (irritable bowel syndrome)   . Anxiety   . Reflux     Past Surgical History  Procedure Laterality Date  . Carpal tunnel release    . Colonoscopy    . Wisdom tooth extraction    . Lateral epicondyle release  07/08/2011    Procedure: TENNIS ELBOW RELEASE;  Surgeon: Brady Civil, MD;  Location: [facility];  Service: Orthopedics;  Laterality: Left;  Left Tennis Elbow Release  . Tonsillectomy      There were no vitals filed for this visit.  Visit Diagnosis:  Pain in right elbow  Weakness of right hand      Subjective Assessment - 07/04/14 0707    Subjective Tape maybe helped a little . 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 Sore today 4/10   Currently in Pain? Yes   Pain Score 4    Pain Location Elbow   Pain Orientation Right   Pain Descriptors / Indicators Aching;Sharp   Pain Type Chronic pain   Pain Onset More than a month ago   Pain Frequency Constant   Aggravating Factors  work , gripping , using arm   Pain Relieving Factors meds , cold   Multiple Pain Sites No                         [facility] Adult PT Treatment/Exercise - 07/04/14 0709    Ultrasound   Ultrasound Location lateral RT elbow   Ultrasound Parameters 1.2 Wcm2, 50%    Ultrasound Goals Pain   Manual Therapy   Manual Therapy Massage   Massage STW to elbow and forarm extensor group   Other Manual Therapy Kinesiotex tape, edems 2 fans, X with center cut out to create space, other manual retrograde at axilla and proximal arm to reduce edema and activate the lymph system                  PT Short Term Goals - 07/02/14 6962    PT SHORT TERM GOAL #1   Title He will be independent with inital HEP   Time 3   Period Weeks   Status Achieved   PT SHORT TERM GOAL #2   Title He will report pain improved 30% or more   Baseline No change except when he does not use   Time 3   Period Weeks   Status On-going   PT SHORT TERM GOAL #3   Title He will increase RT grip strength to 90 pounds or more   Time 3   Period Weeks   Status Unable to assess           PT Long Term  Goals - 06/25/14 0911    PT LONG TERM GOAL #1   Title He will be able to do all HEP issued as of last visit   Time 6   Period Weeks   Status New   PT LONG TERM GOAL #2   Title He will report pain decreased 50% or more   Period Weeks   Status New   PT LONG TERM GOAL #3   Title He will increase RT grip strength to 100 pound s or more   Time 6   Period Weeks   Status New   PT LONG TERM GOAL #4   Title He will return to using mouse on RT hand for work.    Time 6   Period Weeks   Status New   PT LONG TERM GOAL #5   Title He will report able to pull bow with 1-3/10 max pain   Time 6   Period Weeks   Status New               Plan - 07/04/14 0800    Clinical Impression Statement May have some benefit from new tape so retaped. Will continue STW , exercisee and [place] until ionto order here   PT Next Visit Plan [place], STW, advance exercise, retape or ionto if order signed   PT Home Exercise Plan Stretch and strength light weight (can or 2 pound weight)   Consulted and Agree with Plan of Care Patient        Problem List Patient Active Problem List   Diagnosis Date  Noted  . Tennis elbow 03/29/2011  . BACK PAIN, LUMBAR 05/23/2008  . PES PLANUS 05/23/2008    Brady [name] PT 07/04/2014, 8:02 AM  [facility] [address] [place], [place], [address] Phone: [phone]   Fax:  [phone]     PHYSICAL THERAPY DISCHARGE SUMMARY  Visits from Start of Care:   Current functional level related to goals / functional outcomes: See above   Remaining deficits: Brady [name] is now post release and apparently is improved   Education / Equipment:  Plan: Patient agrees to discharge.  Patient goals were not met. Patient is being discharged due to a change in medical status.  ?????   Brady [name],PT     01/06/15   10:28 AM

## 2014-07-07 ENCOUNTER — Ambulatory Visit: Payer: 59 | Admitting: Sports Medicine

## 2014-07-10 ENCOUNTER — Encounter: Payer: Self-pay | Admitting: Gastroenterology

## 2014-07-10 ENCOUNTER — Encounter: Payer: Self-pay | Admitting: Orthopedic Surgery

## 2014-07-10 ENCOUNTER — Ambulatory Visit (INDEPENDENT_AMBULATORY_CARE_PROVIDER_SITE_OTHER): Payer: 59 | Admitting: Orthopedic Surgery

## 2014-07-10 VITALS — BP 104/69 | Ht 75.0 in | Wt 250.0 lb

## 2014-07-10 DIAGNOSIS — M7711 Lateral epicondylitis, right elbow: Secondary | ICD-10-CM | POA: Diagnosis not present

## 2014-07-10 NOTE — Progress Notes (Signed)
Patient ID: Brady [name], male   DOB: [birth year], 44 y.o.   MRN: [id number]  Chief Complaint  Patient presents with  . Elbow Pain    right tennis elbow x 6 months ref. T.DRAPER     MIKLE STERNBERG is a 44 y.o. male.   HPI 44 year old male previous left tennis elbow release presents with right elbow pain  He has are ready been treated with various patches oral medication physical therapy ice treatment ultrasound therapy ibuprofen taping. 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 Mitral prolapse   . Allergic rhinitis   . IBS (irritable bowel syndrome)   . Anxiety   . Reflux     Past Surgical History  Procedure Laterality Date  . Carpal tunnel release    . Colonoscopy    . Wisdom tooth extraction    . Lateral epicondyle release  07/08/2011    Procedure: TENNIS ELBOW RELEASE;  Surgeon: Carole Civil, MD;  Location: [facility];  Service: Orthopedics;  Laterality: Left;  Left Tennis Elbow Release  . Tonsillectomy      Family History  Problem Relation Age of Onset  . Diabetes    . Hypertension Mother   . Hypertension Father     Social History History  Substance Use Topics  . Smoking status: Never Smoker   . Smokeless tobacco: Not on file  . Alcohol Use: Yes     Comment: one beer a month    Allergies  Allergen Reactions  . Penicillins Other (See Comments)    Patient states he was deathly ill    Current Outpatient Prescriptions  Medication Sig Dispense Refill  . fexofenadine (ALLEGRA) 180 MG tablet Take 180 mg by mouth daily.    . fluticasone (FLONASE) 50 MCG/ACT nasal spray PLACE 1 SPRAY INTO THE NOSE DAILY. 48 g 2  . Glucosamine HCl (GLUCOSAMINE PO) Take by mouth.    Marland Kitchen POTASSIUM PO Take by mouth.    . nitroGLYCERIN (NITRODUR - DOSED IN MG/24 HR) 0.2 mg/hr patch  Place 1/4 patch to affected area daily (Patient not taking: Reported on 06/25/2014) 30 patch 1   No current facility-administered medications for this visit.       Physical Exam Blood pressure 104/69, height 6\' 3"  (1.905 m), weight 250 lb (113.399 kg). Physical Exam The patient is well developed well nourished and well groomed. Orientation to person place and time is normal  Mood is pleasant. Ambulatory status normal Right elbow tenderness lateral epicondyles for range of motion varus valgus stability normal motor exam normal painful wrist extension against resistance skin intact pulses good sensation normal  Left elbow previous skin incision noted he has no pain tenderness or swelling full range of motion neurovascular exam intact  Data Reviewed X-rays are essentially normal please see my report  Assessment Right tennis elbow failed conservative treatment patient requests surgical intervention  Encounter Diagnosis  Name Primary?  . Tennis elbow, right Yes    Plan Right open tennis elbow release

## 2014-07-10 NOTE — Patient Instructions (Signed)
Surgery June 10th Right tennis elbow release

## 2014-07-10 NOTE — Addendum Note (Signed)
Addended by: Baldomero Lamy B on: 07/10/2014 05:46 PM   Modules accepted: Orders

## 2014-07-11 ENCOUNTER — Telehealth: Payer: Self-pay | Admitting: Orthopedic Surgery

## 2014-07-11 NOTE — Telephone Encounter (Signed)
Regarding out-patient surgery scheduled at [facility] 08/08/14, CPT [id number], [phone], ICD-10 M77.11, contacted [facility] insurance, ph [phone]; initially spoke with Teletha A - who transferred me to Pre-Authorization; per [place] J, No pre-authorization is required.  His name and today's date, 07/11/14, 10:29AM, for reference.

## 2014-07-15 ENCOUNTER — Ambulatory Visit: Payer: 59

## 2014-08-01 NOTE — Patient Instructions (Signed)
    Brady [name] [facility]  08/01/2014     @PREFPERIOPPHARMACY @   Your procedure is scheduled on 08/08/2014.  Report to [facility] at 6:15 A.M.  Call this number if you have problems the morning of surgery:  [phone]   Remember:  Do not eat food or drink liquids after midnight.  Take these medicines the morning of surgery with A SIP OF WATER Allegra, Glucosamine, Turmeric and Flonase   Do not wear jewelry, make-up or nail polish.  Do not wear lotions, powders, or perfumes.  You may wear deodorant.  Do not shave 48 hours prior to surgery.  Men may shave face and neck.  Do not bring valuables to the hospital.  [facility] is not responsible for any belongings or valuables.  Contacts, dentures or bridgework may not be worn into surgery.  Leave your suitcase in the car.  After surgery it may be brought to your room.  For patients admitted to the hospital, discharge time will be determined by your treatment team.  Patients discharged the day of surgery will not be allowed to drive home.   Name and phone number of your driver:   family Special instructions:  [facility]  Tennis Elbow Tennis elbow can happen in any sport or job where you overuse your elbow. It is caused by doing the same motion over and over. This makes small tears in the forearm muscles. It can also cause muscle redness, soreness, and puffiness (inflammation). HOME CARE  Rest.  Use your other hand or arm that is not affected. Change your grip.  Only take medicine as told by your doctor.  Put ice on the area after activity.  Put ice in a plastic bag.  Place a towel between your skin and the bag.  Leave the ice on for 15-20 minutes, 03-04 times a day.  Wear a splint or sling as told by your doctor. This lets the area rest and heal faster. GET HELP RIGHT AWAY IF: Your pain gets worse or does not improve in 2 weeks even with treatment. MAKE SURE YOU:   Understand these instructions.  Will watch your  condition.  Will get help right away if you are not doing well or get worse. Document Released: 08/04/2009 Document Revised: 05/09/2011 Document Reviewed: 08/04/2009 [facility] Patient Information 2015 [place], [place]. This information is not intended to replace advice given to you by your health care provider. Make sure you discuss any questions you have with your health care provider.   Please read over the following fact sheets that you were given. Care and Recovery After Surgery

## 2014-08-04 ENCOUNTER — Encounter (HOSPITAL_COMMUNITY)
Admission: RE | Admit: 2014-08-04 | Discharge: 2014-08-04 | Disposition: A | Discharge status: Home / Self Care | Payer: 59 | Source: Ambulatory Visit | Attending: Orthopedic Surgery | Admitting: Orthopedic Surgery

## 2014-08-04 ENCOUNTER — Encounter (HOSPITAL_COMMUNITY): Payer: Self-pay

## 2014-08-04 DIAGNOSIS — Z01818 Encounter for other preprocedural examination: Secondary | ICD-10-CM | POA: Diagnosis present

## 2014-08-04 DIAGNOSIS — M7711 Lateral epicondylitis, right elbow: Secondary | ICD-10-CM | POA: Insufficient documentation

## 2014-08-04 LAB — CBC
HCT: 46.3 % (ref 39.0–52.0)
HEMOGLOBIN: 15.4 g/dL (ref 13.0–17.0)
MCH: 29.2 pg (ref 26.0–34.0)
MCHC: 33.3 g/dL (ref 30.0–36.0)
MCV: 87.7 fL (ref 78.0–100.0)
Platelets: 209 10*3/uL (ref 150–400)
RBC: 5.28 MIL/uL (ref 4.22–5.81)
RDW: 13.1 % (ref 11.5–15.5)
WBC: 8.5 10*3/uL (ref 4.0–10.5)

## 2014-08-04 LAB — BASIC METABOLIC PANEL
Anion gap: 7 (ref 5–15)
BUN: 19 mg/dL (ref 6–20)
CHLORIDE: 104 mmol/L (ref 101–111)
CO2: 27 mmol/L (ref 22–32)
Calcium: 9.4 mg/dL (ref 8.9–10.3)
Creatinine, Ser: 1.02 mg/dL (ref 0.61–1.24)
GFR calc non Af Amer: >60 mL/min (ref >60)
Glucose, Bld: 102 mg/dL — ABNORMAL HIGH (ref 65–99)
Potassium: 4.7 mmol/L (ref 3.5–5.1)
Sodium: 138 mmol/L (ref 135–145)

## 2014-08-07 NOTE — H&P (Signed)
Patient ID: Brady [name], male   DOB: [birth year], 44 y.o.   MRN: [id number]    Chief Complaint   Patient presents with   .  Elbow Pain       right tennis elbow x 6 months ref. T.DRAPER      Brady [name] is a 44 y.o. male.   HPI 44 year old male previous left tennis elbow release presents with right elbow pain  He has are ready been treated with various patches oral medication physical therapy ice treatment ultrasound therapy ibuprofen taping. 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  Mitral prolapse     .  Allergic rhinitis     .  IBS (irritable bowel syndrome)     .  Anxiety     .  Reflux         Past Surgical History   Procedure  Laterality  Date   .  Carpal tunnel release       .  Colonoscopy       .  Wisdom tooth extraction       .  Lateral epicondyle release    07/08/2011       Procedure: TENNIS ELBOW RELEASE;  Surgeon: Carole Civil, MD;  Location: [facility];  Service: Orthopedics;  Laterality: Left;  Left Tennis Elbow Release   .  Tonsillectomy           Family History   Problem  Relation  Age of Onset   .  Diabetes       .  Hypertension  Mother     .  Hypertension  Father       Social History History   Substance Use Topics   .  Smoking status:  Never Smoker    .  Smokeless tobacco:  Not on file   .  Alcohol Use:  Yes         Comment: one beer a month       Allergies   Allergen  Reactions   .  Penicillins  Other (See Comments)       Patient states he was deathly ill       Current Outpatient Prescriptions   Medication  Sig  Dispense  Refill   .  fexofenadine (ALLEGRA) 180 MG tablet  Take 180 mg by mouth daily.       .  fluticasone (FLONASE) 50 MCG/ACT nasal spray  PLACE 1 SPRAY INTO THE NOSE DAILY.  48 g  2   .  Glucosamine HCl (GLUCOSAMINE  PO)  Take by mouth.       Marland Kitchen  POTASSIUM PO  Take by mouth.       .  nitroGLYCERIN (NITRODUR - DOSED IN MG/24 HR) 0.2 mg/hr patch  Place 1/4 patch to affected area daily (Patient not taking: Reported on 06/25/2014)  30 patch  1      No current facility-administered medications for this visit.        Physical Exam Blood pressure 104/69, height 6\' 3"  (1.905 m), weight 250 lb (113.399 kg). Physical Exam The patient is well developed well nourished and well groomed. Orientation to person place and time is normal   Mood is pleasant. Ambulatory status normal Right elbow tenderness lateral epicondyles for range of  motion varus valgus stability normal motor exam normal painful wrist extension against resistance skin intact pulses good sensation normal  Left elbow previous skin incision noted he has no pain tenderness or swelling full range of motion neurovascular exam intact  Data Reviewed X-rays are essentially normal please see my report  Assessment Right tennis elbow failed conservative treatment patient requests surgical intervention    Encounter Diagnosis   Name  Primary?   .  Tennis elbow, right  Yes     Plan Right open tennis elbow release

## 2014-08-08 ENCOUNTER — Encounter (HOSPITAL_COMMUNITY): Payer: Self-pay | Admitting: *Deleted

## 2014-08-08 ENCOUNTER — Ambulatory Visit (HOSPITAL_COMMUNITY): Payer: 59 | Admitting: Anesthesiology

## 2014-08-08 ENCOUNTER — Encounter (HOSPITAL_COMMUNITY)
Admission: RE | Disposition: A | Discharge status: Home / Self Care | Payer: Self-pay | Source: Ambulatory Visit | Attending: Orthopedic Surgery

## 2014-08-08 ENCOUNTER — Ambulatory Visit (HOSPITAL_COMMUNITY)
Admission: RE | Admit: 2014-08-08 | Discharge: 2014-08-08 | Disposition: A | Discharge status: Home / Self Care | Payer: 59 | Source: Ambulatory Visit | Attending: Orthopedic Surgery | Admitting: Orthopedic Surgery

## 2014-08-08 DIAGNOSIS — Z79899 Other long term (current) drug therapy: Secondary | ICD-10-CM | POA: Insufficient documentation

## 2014-08-08 DIAGNOSIS — K589 Irritable bowel syndrome without diarrhea: Secondary | ICD-10-CM | POA: Insufficient documentation

## 2014-08-08 DIAGNOSIS — M7711 Lateral epicondylitis, right elbow: Secondary | ICD-10-CM | POA: Insufficient documentation

## 2014-08-08 DIAGNOSIS — J309 Allergic rhinitis, unspecified: Secondary | ICD-10-CM | POA: Diagnosis not present

## 2014-08-08 DIAGNOSIS — K219 Gastro-esophageal reflux disease without esophagitis: Secondary | ICD-10-CM | POA: Diagnosis not present

## 2014-08-08 DIAGNOSIS — M7710 Lateral epicondylitis, unspecified elbow: Secondary | ICD-10-CM | POA: Insufficient documentation

## 2014-08-08 DIAGNOSIS — I341 Nonrheumatic mitral (valve) prolapse: Secondary | ICD-10-CM | POA: Diagnosis not present

## 2014-08-08 HISTORY — PX: TENNIS ELBOW RELEASE/NIRSCHEL PROCEDURE: SHX6651

## 2014-08-08 SURGERY — TENNIS ELBOW RELEASE/NIRSCHEL PROCEDURE
Anesthesia: General | Site: Elbow | Laterality: Right

## 2014-08-08 MED ORDER — PROPOFOL 10 MG/ML IV BOLUS
INTRAVENOUS | Status: DC | PRN
  Administered 2014-08-08: 150 mg via INTRAVENOUS

## 2014-08-08 MED ORDER — LACTATED RINGERS IV SOLN
INTRAVENOUS | Status: DC
  Administered 2014-08-08: 07:00:00 via INTRAVENOUS

## 2014-08-08 MED ORDER — ONDANSETRON HCL 4 MG/2ML IJ SOLN
4.0000 mg | Freq: Once | INTRAMUSCULAR | Status: AC
  Administered 2014-08-08: 4 mg via INTRAVENOUS

## 2014-08-08 MED ORDER — SODIUM CHLORIDE 0.9 % IR SOLN
Status: DC | PRN
  Administered 2014-08-08: 1000 mL

## 2014-08-08 MED ORDER — FENTANYL CITRATE (PF) 100 MCG/2ML IJ SOLN
25.0000 ug | INTRAMUSCULAR | Status: AC
  Administered 2014-08-08 (×2): 25 ug via INTRAVENOUS

## 2014-08-08 MED ORDER — ONDANSETRON HCL 4 MG/2ML IJ SOLN
INTRAMUSCULAR | Status: AC
Start: 2014-08-08 — End: 2014-08-08
  Filled 2014-08-08: qty 2

## 2014-08-08 MED ORDER — MIDAZOLAM HCL 2 MG/2ML IJ SOLN
INTRAMUSCULAR | Status: AC
  Filled 2014-08-08: qty 2

## 2014-08-08 MED ORDER — FENTANYL CITRATE (PF) 100 MCG/2ML IJ SOLN
25.0000 ug | INTRAMUSCULAR | Status: DC | PRN
  Administered 2014-08-08 (×3): 50 ug via INTRAVENOUS
  Filled 2014-08-08 (×2): qty 2

## 2014-08-08 MED ORDER — VANCOMYCIN HCL 10 G IV SOLR
1500.0000 mg | INTRAVENOUS | Status: AC
  Administered 2014-08-08: 1500 mg via INTRAVENOUS
  Filled 2014-08-08: qty 1500

## 2014-08-08 MED ORDER — ONDANSETRON HCL 4 MG/2ML IJ SOLN: 4.0000 mg | Freq: Once | INTRAMUSCULAR | Status: DC | PRN

## 2014-08-08 MED ORDER — LIDOCAINE HCL 1 % IJ SOLN
INTRAMUSCULAR | Status: DC | PRN
  Administered 2014-08-08: 50 mg via INTRADERMAL

## 2014-08-08 MED ORDER — FENTANYL CITRATE (PF) 250 MCG/5ML IJ SOLN
INTRAMUSCULAR | Status: AC
  Filled 2014-08-08: qty 5

## 2014-08-08 MED ORDER — PROPOFOL 10 MG/ML IV BOLUS
INTRAVENOUS | Status: AC
  Filled 2014-08-08: qty 20

## 2014-08-08 MED ORDER — FENTANYL CITRATE (PF) 100 MCG/2ML IJ SOLN
INTRAMUSCULAR | Status: AC
  Filled 2014-08-08: qty 2

## 2014-08-08 MED ORDER — CHLORHEXIDINE GLUCONATE 4 % EX LIQD: 60.0000 mL | Freq: Once | CUTANEOUS | Status: DC

## 2014-08-08 MED ORDER — FENTANYL CITRATE (PF) 250 MCG/5ML IJ SOLN
INTRAMUSCULAR | Status: DC | PRN
  Administered 2014-08-08: 50 ug via INTRAVENOUS
  Administered 2014-08-08 (×2): 25 ug via INTRAVENOUS
  Administered 2014-08-08 (×2): 50 ug via INTRAVENOUS

## 2014-08-08 MED ORDER — MIDAZOLAM HCL 2 MG/2ML IJ SOLN
1.0000 mg | INTRAMUSCULAR | Status: DC | PRN
Start: 2014-08-08 — End: 2014-08-08
  Administered 2014-08-08: 2 mg via INTRAVENOUS

## 2014-08-08 MED ORDER — LIDOCAINE HCL (PF) 1 % IJ SOLN
INTRAMUSCULAR | Status: AC
Start: 2014-08-08 — End: 2014-08-08
  Filled 2014-08-08: qty 5

## 2014-08-08 MED ORDER — BUPIVACAINE-EPINEPHRINE (PF) 0.5% -1:200000 IJ SOLN
INTRAMUSCULAR | Status: AC
Start: 2014-08-08 — End: 2014-08-08
  Filled 2014-08-08: qty 30

## 2014-08-08 MED ORDER — HYDROCODONE-ACETAMINOPHEN 5-325 MG PO TABS: 1.0000 | ORAL_TABLET | ORAL | Status: DC | PRN

## 2014-08-08 MED ORDER — PROPOFOL 10 MG/ML IV BOLUS
INTRAVENOUS | Status: AC
Start: 2014-08-08 — End: 2014-08-08
  Filled 2014-08-08: qty 20

## 2014-08-08 MED ORDER — BUPIVACAINE-EPINEPHRINE 0.5% -1:200000 IJ SOLN
INTRAMUSCULAR | Status: DC | PRN
  Administered 2014-08-08: 15 mL

## 2014-08-08 SURGICAL SUPPLY — 62 items
BAG HAMPER (MISCELLANEOUS) ×3 IMPLANT
BANDAGE ELASTIC 4 VELCRO NS (GAUZE/BANDAGES/DRESSINGS) ×3 IMPLANT
BANDAGE ELASTIC 6 VELCRO NS (GAUZE/BANDAGES/DRESSINGS) ×3 IMPLANT
BANDAGE ESMARK 4X12 BL STRL LF (DISPOSABLE) ×1 IMPLANT
BANDAGE GAUZE ELAST BULKY 4 IN (GAUZE/BANDAGES/DRESSINGS) ×3 IMPLANT
BLADE 10 SAFETY STRL DISP (BLADE) IMPLANT
BNDG COHESIVE 4X5 TAN STRL (GAUZE/BANDAGES/DRESSINGS) IMPLANT
BNDG ESMARK 4X12 BLUE STRL LF (DISPOSABLE) ×3
CHLORAPREP W/TINT 26ML (MISCELLANEOUS) ×3 IMPLANT
CLOSURE WOUND 1/2 X4 (GAUZE/BANDAGES/DRESSINGS) ×1
CLOTH BEACON ORANGE TIMEOUT ST (SAFETY) ×3 IMPLANT
COVER LIGHT HANDLE STERIS (MISCELLANEOUS) ×6 IMPLANT
CUFF TOURNIQUET SINGLE 18IN (TOURNIQUET CUFF) ×3 IMPLANT
DECANTER SPIKE VIAL GLASS SM (MISCELLANEOUS) ×3 IMPLANT
DRAPE PROXIMA HALF (DRAPES) ×3 IMPLANT
DRSG XEROFORM 1X8 (GAUZE/BANDAGES/DRESSINGS) ×3 IMPLANT
ELECT REM PT RETURN 9FT ADLT (ELECTROSURGICAL) ×3
ELECTRODE REM PT RTRN 9FT ADLT (ELECTROSURGICAL) ×1 IMPLANT
GAUZE SPONGE 4X4 12PLY STRL (GAUZE/BANDAGES/DRESSINGS) ×2 IMPLANT
GAUZE XEROFORM 5X9 LF (GAUZE/BANDAGES/DRESSINGS) IMPLANT
GLOVE BIO SURGEON STRL SZ 6.5 (GLOVE) ×2 IMPLANT
GLOVE BIO SURGEONS STRL SZ 6.5 (GLOVE) ×1
GLOVE BIOGEL PI IND STRL 7.0 (GLOVE) ×1 IMPLANT
GLOVE BIOGEL PI IND STRL 7.5 (GLOVE) ×1 IMPLANT
GLOVE BIOGEL PI INDICATOR 7.0 (GLOVE) ×2
GLOVE BIOGEL PI INDICATOR 7.5 (GLOVE) ×2
GLOVE ECLIPSE 6.5 STRL STRAW (GLOVE) ×3 IMPLANT
GLOVE SKINSENSE NS SZ8.0 LF (GLOVE) ×4
GLOVE SKINSENSE STRL SZ8.0 LF (GLOVE) ×2 IMPLANT
GLOVE SS N UNI LF 8.5 STRL (GLOVE) ×3 IMPLANT
GOWN STRL REUS W/TWL LRG LVL3 (GOWN DISPOSABLE) ×6 IMPLANT
GOWN STRL REUS W/TWL XL LVL3 (GOWN DISPOSABLE) ×3 IMPLANT
INST SET MINOR BONE (KITS) ×3 IMPLANT
KIT ROOM TURNOVER APOR (KITS) ×3 IMPLANT
LIQUID BAND (GAUZE/BANDAGES/DRESSINGS) ×3 IMPLANT
MANIFOLD NEPTUNE II (INSTRUMENTS) ×3 IMPLANT
NEEDLE HYPO 21X1.5 SAFETY (NEEDLE) ×3 IMPLANT
NS IRRIG 1000ML POUR BTL (IV SOLUTION) ×3 IMPLANT
PACK BASIC LIMB (CUSTOM PROCEDURE TRAY) ×3 IMPLANT
PAD ABD 5X9 TENDERSORB (GAUZE/BANDAGES/DRESSINGS) IMPLANT
PAD ARMBOARD 7.5X6 YLW CONV (MISCELLANEOUS) ×3 IMPLANT
PAD CAST 4YDX4 CTTN HI CHSV (CAST SUPPLIES) IMPLANT
PADDING CAST COTTON 4X4 STRL (CAST SUPPLIES)
SET BASIN LINEN APH (SET/KITS/TRAYS/PACK) ×3 IMPLANT
SLING ARM MED ADULT FOAM STRAP (SOFTGOODS) ×3 IMPLANT
SPLINT IMMOBILIZER J 3INX20FT (CAST SUPPLIES)
SPLINT J IMMOBILIZER 3X20FT (CAST SUPPLIES) IMPLANT
SPONGE GAUZE 4X4 12PLY (GAUZE/BANDAGES/DRESSINGS) ×3 IMPLANT
STAPLER VISISTAT 35W (STAPLE) ×3 IMPLANT
STRIP CLOSURE SKIN 1/2X4 (GAUZE/BANDAGES/DRESSINGS) ×2 IMPLANT
SUT ETHIBOND 2 V 37 (SUTURE) IMPLANT
SUT ETHILON 3 0 FSL (SUTURE) IMPLANT
SUT MON AB 2-0 SH 27 (SUTURE) ×2
SUT MON AB 2-0 SH27 (SUTURE) ×1 IMPLANT
SUT PROLENE 3 0 PS 1 (SUTURE) IMPLANT
SUT VIC AB 1 CT1 27 (SUTURE)
SUT VIC AB 1 CT1 27XBRD ANTBC (SUTURE) IMPLANT
SUT VICRYL AB 3-0 FS1 BRD 27IN (SUTURE) IMPLANT
SYR 30ML LL (SYRINGE) ×3 IMPLANT
SYR BULB IRRIGATION 50ML (SYRINGE) ×3 IMPLANT
SYRINGE 10CC LL (SYRINGE) IMPLANT
TOWEL OR 17X26 4PK STRL BLUE (TOWEL DISPOSABLE) ×3 IMPLANT

## 2014-08-08 NOTE — Addendum Note (Signed)
Addendum  created 08/08/14 1527 by Ollen Bowl, CRNA   Modules edited: Charges VN

## 2014-08-08 NOTE — Anesthesia Preprocedure Evaluation (Signed)
Anesthesia Evaluation  Patient identified by MRN, date of birth, ID band Patient awake    Reviewed: Allergy & Precautions, H&P , NPO status , Patient's Chart, lab work & pertinent test results  Airway Mallampati: II  TM Distance: >3 FB Neck ROM: Full    Dental no notable dental hx.    Pulmonary neg pulmonary ROS,    Pulmonary exam normal       Cardiovascular + Valvular Problems/Murmurs MVP Rhythm:Regular Rate:Normal     Neuro/Psych Anxiety negative neurological ROS     GI/Hepatic negative GI ROS, Neg liver ROS,   Endo/Other  negative endocrine ROS  Renal/GU negative Renal ROS     Musculoskeletal negative musculoskeletal ROS (+)   Abdominal Normal abdominal exam  (+)   Peds  Hematology negative hematology ROS (+)   Anesthesia Other Findings   Reproductive/Obstetrics                             Anesthesia Physical Anesthesia Plan  ASA: II  Anesthesia Plan: General   Post-op Pain Management:    Induction: Intravenous  Airway Management Planned: LMA  Additional Equipment:   Intra-op Plan:   Post-operative Plan: Extubation in OR  Informed Consent: I have reviewed the patients History and Physical, chart, labs and discussed the procedure including the risks, benefits and alternatives for the proposed anesthesia with the patient or authorized representative who has indicated his/her understanding and acceptance.     Plan Discussed with: CRNA  Anesthesia Plan Comments:         Anesthesia Quick Evaluation

## 2014-08-08 NOTE — Anesthesia Procedure Notes (Signed)
Procedure Name: LMA Insertion Date/Time: 08/08/2014 7:41 AM Performed by: Tressie Stalker E Pre-anesthesia Checklist: Patient identified, Patient being monitored, Emergency Drugs available, Timeout performed and Suction available Patient Re-evaluated:Patient Re-evaluated prior to inductionOxygen Delivery Method: Circle System Utilized Preoxygenation: Pre-oxygenation with 100% oxygen Intubation Type: IV induction Ventilation: Mask ventilation without difficulty LMA: LMA inserted LMA Size: 5.0 Number of attempts: 1 Placement Confirmation: positive ETCO2 and breath sounds checked- equal and bilateral

## 2014-08-08 NOTE — Interval H&P Note (Signed)
History and Physical Interval Note:  08/08/2014 7:24 AM  Brady [name]  has presented today for surgery, with the diagnosis of RIGHT TENNIS ELBOW RELEASE  The various methods of treatment have been discussed with the patient and family. After consideration of risks, benefits and other options for treatment, the patient has consented to  Procedure(s): RIGHT TENNIS ELBOW RELEASE as a surgical intervention .  The patient's history has been reviewed, patient examined, no change in status, stable for surgery.  I have reviewed the patient's chart and labs.  Questions were answered to the patient's satisfaction.     Arther Abbott

## 2014-08-08 NOTE — Transfer of Care (Signed)
Immediate Anesthesia Transfer of Care Note  Patient: Brady [name]  Procedure(s) Performed: Procedure(s): RIGHT TENNIS ELBOW RELEASE (Right)  Patient Location: PACU  Anesthesia Type:General  Level of Consciousness: awake, alert  and oriented  Airway & Oxygen Therapy: Patient Spontanous Breathing and Patient connected to face mask oxygen  Post-op Assessment: Report given to RN  Post vital signs: Reviewed and stable  Last Vitals:  Filed Vitals:   08/08/14 0730  BP: 115/83  Pulse:   Temp:   Resp: 20    Complications: No apparent anesthesia complications

## 2014-08-08 NOTE — Op Note (Signed)
08/08/2014  8:24 AM  PATIENT:  Brady [name]  44 y.o. male  PRE-OPERATIVE DIAGNOSIS:  RIGHT TENNIS ELBOW   POST-OPERATIVE DIAGNOSIS:  RIGHT TENNIS ELBOW   Findings: pathologic tissue in the ecrb  PROCEDURE:  Procedure(s): RIGHT TENNIS ELBOW RELEASE (Right)  Details of procedure  This is a 44 year old male who had right lateral elbow pain. He was treated nonoperatively for proximal 6 months. He did not improve. He was unhappy with the condition of his elbow and opted for surgical release of the right elbow versus continued nonoperative treatment.  He was identified in the preop holding area his right arm was confirmed as a surgical site and marked. The chart update was completed. He was taken to the operating room for general anesthesia. After successful LMA anesthesia. His vancomycin was completed (secondary to penicillin allergy).  The arm was prepped with) prep and draped sterilely. Timeout was completed. The limb was exsanguinated 4 inch Esmarch and the tourniquet was elevated to 250 mmHg. It stayed up for 17 minutes.  A longitudinal incision was made over the lateral epicondyle. The subcutaneous tissue was divided bluntly down to the fascia of the ECRL and the EDC. This tissue was divided and the ECRB was released from the lateral epicondyle. The bone of the lateral epicondyle was decorticated. The ECRB was reattached to the fascia of the posterior portion of the humerus and the ECRL and EDC were repaired with absorbable suture. The wound was irrigated and closed with 2-0 Monocryl and 2 layers. We then used skin seal over the top. We placed a sterile dressing release the tourniquet the fingers have good color and capillary refill.  He was then extubated and taken to recovery room in stable condition SURGEON:  Surgeon(s) and Role:    * Carole Civil, MD - Primary  PHYSICIAN ASSISTANT:   ASSISTANTS: betty ashley    ANESTHESIA:   general  EBL:  Total I/O In: 600  [I.V.:600] Out: -   BLOOD ADMINISTERED:none  DRAINS: none   LOCAL MEDICATIONS USED:  MARCAINE     SPECIMEN:  No Specimen  DISPOSITION OF SPECIMEN:  N/A  COUNTS:  YES  TOURNIQUET:   Total Tourniquet Time Documented: Upper Arm (Right) - 22 minutes Total: Upper Arm (Right) - 22 minutes   DICTATION: .Viviann Spare Dictation  PLAN OF CARE: Discharge to home after PACU  PATIENT DISPOSITION:  PACU - hemodynamically stable.   Delay start of Pharmacological VTE agent (>24hrs) due to surgical blood loss or risk of bleeding: n/a  Postoperative plan. On follow-up he will have a dressing change and application of Tegaderm. He will have the subcuticular stitch cut at the skin edge at 2 weeks. He can perform active range of motion exercises of the wrist and elbow but is to avoid any wrist power extension exercises or activities and avoid any lifting over 2 pounds. This will be a restriction for 6 weeks

## 2014-08-08 NOTE — Brief Op Note (Signed)
08/08/2014  8:24 AM  PATIENT:  Brady [name]  44 y.o. male  PRE-OPERATIVE DIAGNOSIS:  RIGHT TENNIS ELBOW   POST-OPERATIVE DIAGNOSIS:  RIGHT TENNIS ELBOW   Findings: pathologic tissue in the ecrb  PROCEDURE:  Procedure(s): RIGHT TENNIS ELBOW RELEASE (Right)  SURGEON:  Surgeon(s) and Role:    * Carole Civil, MD - Primary  PHYSICIAN ASSISTANT:   ASSISTANTS: betty ashley    ANESTHESIA:   general  EBL:  Total I/O In: 600 [I.V.:600] Out: -   BLOOD ADMINISTERED:none  DRAINS: none   LOCAL MEDICATIONS USED:  MARCAINE     SPECIMEN:  No Specimen  DISPOSITION OF SPECIMEN:  N/A  COUNTS:  YES  TOURNIQUET:   Total Tourniquet Time Documented: Upper Arm (Right) - 22 minutes Total: Upper Arm (Right) - 22 minutes   DICTATION: .Viviann Spare Dictation  PLAN OF CARE: Discharge to home after PACU  PATIENT DISPOSITION:  PACU - hemodynamically stable.   Delay start of Pharmacological VTE agent (>24hrs) due to surgical blood loss or risk of bleeding: n/a

## 2014-08-08 NOTE — Discharge Instructions (Signed)
Incision Care °An incision is when a surgeon cuts into your body tissues. After surgery, the incision needs to be cared for properly to prevent infection.  °HOME CARE INSTRUCTIONS  °· Take all medicine as directed by your caregiver. Only take over-the-counter or prescription medicines for pain, discomfort, or fever as directed by your caregiver. °· Do not remove your bandage (dressing) or get your incision wet until your surgeon gives you permission. In the event that your dressing becomes wet, dirty, or starts to smell, change the dressing and call your surgeon for instructions as soon as possible. °· Take showers. Do not take tub baths, swim, or do anything that may soak the wound until it is healed. °· Resume your normal diet and activities as directed or allowed. °· Avoid lifting any weight until you are instructed otherwise. °· Use anti-itch antihistamine medicine as directed by your caregiver. The wound may itch when it is healing. Do not pick or scratch at the wound. °· Follow up with your caregiver for stitch (suture) or staple removal as directed. °· Drink enough fluids to keep your urine clear or pale yellow. °SEEK MEDICAL CARE IF:  °· You have redness, swelling, or increasing pain in the wound that is not controlled with medicine. °· You have drainage, blood, or pus coming from the wound that lasts longer than 1 day. °· You develop muscle aches, chills, or a general ill feeling. °· You notice a bad smell coming from the wound or dressing. °· Your wound edges separate after the sutures, staples, or skin adhesive strips have been removed. °· You develop persistent nausea or vomiting. °SEEK IMMEDIATE MEDICAL CARE IF:  °· You have a fever. °· You develop a rash. °· You develop dizzy episodes or faint while standing. °· You have difficulty breathing. °· You develop any reaction or side effects to medicine given. °MAKE SURE YOU:  °· Understand these instructions. °· Will watch your condition. °· Will get help  right away if you are not doing well or get worse. °Document Released: 09/03/2004 Document Revised: 05/09/2011 Document Reviewed: 04/10/2013 °[employer]® Patient Information ©2015 [employer]. This information is not intended to replace advice given to you by your health care provider. Make sure you discuss any questions you have with your health care provider. ° ° °

## 2014-08-08 NOTE — Anesthesia Postprocedure Evaluation (Signed)
  Anesthesia Post-op Note  Patient: Brady [name]  Procedure(s) Performed: Procedure(s): RIGHT TENNIS ELBOW RELEASE (Right)  Patient Location: PACU  Anesthesia Type:General  Level of Consciousness: awake, alert  and oriented  Airway and Oxygen Therapy: Patient Spontanous Breathing  Post-op Pain: mild  Post-op Assessment: Post-op Vital signs reviewed, Patient's Cardiovascular Status Stable, Respiratory Function Stable, Patent Airway and No signs of Nausea or vomiting              Post-op Vital Signs: Reviewed and stable  Last Vitals:  Filed Vitals:   08/08/14 0958  BP: 114/72  Pulse:   Temp:   Resp:     Complications: No apparent anesthesia complications

## 2014-08-11 ENCOUNTER — Encounter: Payer: Self-pay | Admitting: Orthopedic Surgery

## 2014-08-11 ENCOUNTER — Encounter (HOSPITAL_COMMUNITY): Payer: Self-pay | Admitting: Orthopedic Surgery

## 2014-08-11 ENCOUNTER — Ambulatory Visit (INDEPENDENT_AMBULATORY_CARE_PROVIDER_SITE_OTHER): Payer: 59 | Admitting: Orthopedic Surgery

## 2014-08-11 VITALS — BP 87/68 | Ht 75.0 in | Wt 257.0 lb

## 2014-08-11 DIAGNOSIS — Z4789 Encounter for other orthopedic aftercare: Secondary | ICD-10-CM

## 2014-08-11 DIAGNOSIS — M7711 Lateral epicondylitis, right elbow: Secondary | ICD-10-CM

## 2014-08-11 NOTE — Progress Notes (Signed)
Chief Complaint  Patient presents with  . Follow-up    post op 1, Right tennis elbow release, DOS 08/08/14    Postop visit #1 postop day 3 right tennis elbow release  No complaints  Incision clean no drainage. No erythema.  Normal activities of daily living with no heavy lifting return for cutting the absorbable suture at the skin edge  Tegaderm dressing  No swimming okay to shower.

## 2014-08-11 NOTE — Patient Instructions (Signed)
NO HEAVY LIFTING/

## 2014-08-21 ENCOUNTER — Ambulatory Visit (INDEPENDENT_AMBULATORY_CARE_PROVIDER_SITE_OTHER): Payer: 59 | Admitting: Orthopedic Surgery

## 2014-08-21 ENCOUNTER — Encounter: Payer: Self-pay | Admitting: Orthopedic Surgery

## 2014-08-21 VITALS — BP 134/79 | Ht 75.0 in | Wt 257.0 lb

## 2014-08-21 DIAGNOSIS — M7711 Lateral epicondylitis, right elbow: Secondary | ICD-10-CM

## 2014-08-21 DIAGNOSIS — Z4789 Encounter for other orthopedic aftercare: Secondary | ICD-10-CM

## 2014-08-21 NOTE — Progress Notes (Signed)
Patient ID: Brady [name], male   DOB: 03-07-1970, 44 y.o.   MRN: [id number] Postop visit  Suture removal  Abnormal suture ends cut and rest of suture allowed to retract  Wound looks clean  Full range of motion  Patient can resume weightlifting in 4 weeks. 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 Follow-up 6-8 weeks. Status post right tennis elbow release

## 2014-08-27 ENCOUNTER — Encounter: Payer: Self-pay | Admitting: Gastroenterology

## 2014-08-27 ENCOUNTER — Other Ambulatory Visit: Payer: Self-pay

## 2014-08-27 ENCOUNTER — Ambulatory Visit (INDEPENDENT_AMBULATORY_CARE_PROVIDER_SITE_OTHER): Payer: 59 | Admitting: Gastroenterology

## 2014-08-27 VITALS — BP 127/83 | HR 69 | Temp 98.0°F | Ht 75.0 in | Wt 257.8 lb

## 2014-08-27 DIAGNOSIS — Z1211 Encounter for screening for malignant neoplasm of colon: Secondary | ICD-10-CM

## 2014-08-27 DIAGNOSIS — Z0000 Encounter for general adult medical examination without abnormal findings: Secondary | ICD-10-CM | POA: Insufficient documentation

## 2014-08-27 MED ORDER — SOD PICOSULFATE-MAG OX-CIT ACD 10-3.5-12 MG-GM-GM PO PACK: 1.0000 | PACK | ORAL | Status: DC

## 2014-08-27 NOTE — Assessment & Plan Note (Signed)
TCS JUN 6294 COMPLICATED BY HYPOTN AND HR 30S WITH DEMEROL 125 MG IV AND VERSED 7 MG IV.   CHANGE TO FENTANYL AND VERSED. ATROPINE 0.5 MG IV PRIOR TO SEDATION IF HR < 70. TCS/PREPOPIK AUG 2016 FULL LIQUIDS WITH BREAKFAST ON DAY BEFORE. [facility] TBS AFTER TCS

## 2014-08-27 NOTE — Progress Notes (Signed)
Subjective:    Patient ID: Brady [name], male    DOB: January 11, 1971, 44 y.o.   MRN: [id number]  Mickie Hillier, MD  HPI BMs: COUPLE TIMES A DAY.  heartburn or indigestion-OCCASIONAL AFTER PIZZA. PROBLEM WITH SEDATION-RECENTLY HAD TENNIS ELBOW RELEASE AND PASSED OUT IN POSTOP. COULDN'T GIVE BLOOD DUE TO SYNCOPE AFTER GIVING. PROBLEMS WITH SEDATION IN JUN 2011: HYPOTENTION AND BRADYCARDIA.  PT DENIES FEVER, CHILLS, HEMATOCHEZIA, nausea, vomiting, melena, diarrhea, CHEST PAIN, SHORTNESS OF BREATH,  CHANGE IN BOWEL IN HABITS, constipation, abdominal pain, OR problems swallowing.   Past Medical History  Diagnosis Date  . Mitral prolapse   . Allergic rhinitis   . IBS (irritable bowel syndrome)   . Anxiety   . Reflux    Past Surgical History  Procedure Laterality Date  . Carpal tunnel release    . Colonoscopy    . Wisdom tooth extraction    . Lateral epicondyle release  07/08/2011    Procedure: TENNIS ELBOW RELEASE;  Surgeon: Carole Civil, MD;  Location: [facility];  Service: Orthopedics;  Laterality: Left;  Left Tennis Elbow Release  . Tonsillectomy    . Tennis elbow release/nirschel procedure Right 08/08/2014    Procedure: RIGHT TENNIS ELBOW RELEASE;  Surgeon: Carole Civil, MD;  Location: [facility];  Service: Orthopedics;  Laterality: Right;    Allergies  Allergen Reactions  . Penicillins Other (See Comments)    Patient states he was deathly ill   Current Outpatient Prescriptions  Medication Sig Dispense Refill  . cholecalciferol (VITAMIN D) 1000 UNITS tablet Take 1,000 Units by mouth daily.    Marland Kitchen ECHINACEA PO Take 1 tablet by mouth daily.    . fexofenadine (ALLEGRA) 180 MG tablet Take 180 mg by mouth daily.    . Flaxseed, Linseed, (FLAX SEED OIL PO) Take 1 capsule by mouth daily.    . fluticasone (FLONASE) 50 MCG/ACT nasal spray PLACE 1 SPRAY INTO THE NOSE DAILY.    . Ginger, Zingiber officinalis, (GINGER PO) Take 1 capsule by mouth daily.    . Glucosamine HCl  (GLUCOSAMINE PO) Take 2 tablets by mouth daily.     Marland Kitchen HYDROcodone-acetaminophen (NORCO) 5-325 MG per tablet Take 1 tablet by mouth every 4 (four) hours as needed for moderate pain.    Marland Kitchen KRILL OIL PO Take 1 capsule by mouth daily.    . Multiple Vitamin (MULTIVITAMIN WITH MINERALS) TABS tablet Take 1 tablet by mouth daily.    Marland Kitchen POTASSIUM PO Take 1 tablet by mouth daily.     . TURMERIC PO Take 1 tablet by mouth daily.    . vitamin C (ASCORBIC ACID) 500 MG tablet Take 500 mg by mouth daily.       Review of Systems PER HPI OTHERWISE ALL SYSTEMS ARE NEGATIVE.     Objective:   Physical Exam  Constitutional: He is oriented to person, place, and time. He appears well-developed and well-nourished. No distress.  HENT:  Head: Normocephalic and atraumatic.  Mouth/Throat: Oropharynx is clear and moist. No oropharyngeal exudate.  Eyes: Pupils are equal, round, and reactive to light. No scleral icterus.  Neck: Normal range of motion. Neck supple.  Cardiovascular: Normal rate, regular rhythm and normal heart sounds.   Pulmonary/Chest: Effort normal and breath sounds normal. No respiratory distress.  Abdominal: Soft. Bowel sounds are normal. He exhibits no distension. There is no tenderness.  Musculoskeletal: He exhibits no edema.  Lymphadenopathy:    He has no cervical adenopathy.  Neurological: He is alert and  oriented to person, place, and time.  NO FOCAL DEFICITS   Psychiatric: He has a normal mood and affect.  Vitals reviewed.         Assessment & Plan:

## 2014-08-27 NOTE — Patient Instructions (Signed)
  FULL LIQUIDS WITH BREAKFAST ON DAY BEFORE YOUR COLONOSCOPY.  COLONOSCOPY WITH PREPOPIK FOR A BOWEL AUG 2016 .     Full Liquid Diet A high-calorie, high-protein supplement should be used to meet your nutritional requirements when the full liquid diet is continued for more than 2 or 3 days. If this diet is to be used for an extended period of time (more than 7 days), a multivitamin should be considered.  Breads and Starches  Allowed: None are allowed except crackers WHOLE OR pureed (made into a thick, smooth soup) in soup.   Avoid: Any others.    Potatoes/Pasta/Rice  Allowed: ANY ITEM AS A SOUP OR SMALL PLATE OF MASHED POTATOES.       Vegetables  Allowed: Strained tomato or vegetable juice. Vegetables pureed in soup.   Avoid: Any others.    Fruit  Allowed: Any strained fruit juices and fruit drinks. Include 1 serving of citrus or vitamin C-enriched fruit juice daily.   Avoid: Any others.  Meat and Meat Substitutes  Allowed: Egg  Avoid: Any meat, fish, or fowl. All cheese.  Milk  Allowed: Milk beverages, including milk shakes and instant breakfast mixes. Smooth yogurt.   Avoid: Any others. Avoid dairy products if not tolerated.    Soups and Combination Foods  Allowed: Broth, strained cream soups. Strained, broth-based soups.   Avoid: Any others.    Desserts and Sweets  Allowed: flavored gelatin,plain ice cream, sherbet, smooth pudding, junket, fruit ices, frozen ice pops, pudding pops,, frozen fudge pops, chocolate syrup. Sugar, honey, jelly, syrup.   Avoid: Any others.  Fats and Oils  Allowed: Margarine, butter, cream, sour cream, oils.   Avoid: Any others.  Beverages  Allowed: All.   Avoid: None.  Condiments  Allowed: Iodized salt, pepper, spices, flavorings. Cocoa powder.   Avoid: Any others.    SAMPLE MEAL PLAN Breakfast   cup orange juice.   1 OR 2 EGGS   1 cup  milk.   1 cup beverage (coffee or tea).   Cream or sugar, if  desired.    Midmorning Snack  2 SCRAMBLED OR HARD BOILED EGG   Lunch  1 cup cream soup.    cup fruit juice.   1 cup milk.    cup custard.   1 cup beverage (coffee or tea).   Cream or sugar, if desired.    Midafternoon Snack  1 cup milk shake.  Dinner  1 cup cream soup.    cup fruit juice.   1 cup milk.    cup pudding.   1 cup beverage (coffee or tea).   Cream or sugar, if desired.  Evening Snack  1 cup supplement.  To increase calories, add sugar, cream, butter, or margarine if possible. Nutritional supplements will also increase the total calories.

## 2014-10-02 ENCOUNTER — Ambulatory Visit (INDEPENDENT_AMBULATORY_CARE_PROVIDER_SITE_OTHER): Payer: 59 | Admitting: Orthopedic Surgery

## 2014-10-02 VITALS — BP 128/73 | Ht 75.0 in | Wt 256.0 lb

## 2014-10-02 DIAGNOSIS — Z4789 Encounter for other orthopedic aftercare: Secondary | ICD-10-CM

## 2014-10-02 DIAGNOSIS — M7711 Lateral epicondylitis, right elbow: Secondary | ICD-10-CM

## 2014-10-02 NOTE — Progress Notes (Signed)
Patient ID: Brady [name], male   DOB: 23-Aug-1970, 44 y.o.   MRN: [id number]  Follow up visit  Chief Complaint  Patient presents with  . Follow-up    6 week follow up Rt tennis elbow release, DOS 08/08/14    BP 128/73 mmHg  Ht 6\' 3"  (1.905 m)  Wt 256 lb (116.121 kg)  BMI 32.00 kg/m2  Encounter Diagnoses  Name Primary?  Marland Kitchen Aftercare following surgery of the musculoskeletal system Yes  . Tennis elbow, right      6 weeks tennis elbow release right elbow doing well. He's regained full range of motion has no tenderness at the elbow and has returned to work. Okay to do some light lifting progress as tolerated   Follow-up as needed

## 2014-10-13 ENCOUNTER — Ambulatory Visit (HOSPITAL_COMMUNITY)
Admission: RE | Admit: 2014-10-13 | Discharge: 2014-10-13 | Disposition: A | Discharge status: Home / Self Care | Payer: 59 | Source: Ambulatory Visit | Attending: Gastroenterology | Admitting: Gastroenterology

## 2014-10-13 ENCOUNTER — Encounter (HOSPITAL_COMMUNITY)
Admission: RE | Disposition: A | Discharge status: Home / Self Care | Payer: Self-pay | Source: Ambulatory Visit | Attending: Gastroenterology

## 2014-10-13 ENCOUNTER — Encounter (HOSPITAL_COMMUNITY): Payer: Self-pay | Admitting: *Deleted

## 2014-10-13 DIAGNOSIS — Z800 Family history of malignant neoplasm of digestive organs: Secondary | ICD-10-CM | POA: Diagnosis not present

## 2014-10-13 DIAGNOSIS — Z1211 Encounter for screening for malignant neoplasm of colon: Secondary | ICD-10-CM

## 2014-10-13 DIAGNOSIS — Q438 Other specified congenital malformations of intestine: Secondary | ICD-10-CM | POA: Insufficient documentation

## 2014-10-13 DIAGNOSIS — J309 Allergic rhinitis, unspecified: Secondary | ICD-10-CM | POA: Insufficient documentation

## 2014-10-13 DIAGNOSIS — Z79899 Other long term (current) drug therapy: Secondary | ICD-10-CM | POA: Insufficient documentation

## 2014-10-13 DIAGNOSIS — K648 Other hemorrhoids: Secondary | ICD-10-CM | POA: Insufficient documentation

## 2014-10-13 DIAGNOSIS — K644 Residual hemorrhoidal skin tags: Secondary | ICD-10-CM | POA: Insufficient documentation

## 2014-10-13 DIAGNOSIS — Z7951 Long term (current) use of inhaled steroids: Secondary | ICD-10-CM | POA: Diagnosis not present

## 2014-10-13 HISTORY — PX: COLONOSCOPY: SHX5424

## 2014-10-13 HISTORY — DX: Gastro-esophageal reflux disease without esophagitis: K21.9

## 2014-10-13 SURGERY — COLONOSCOPY
Anesthesia: Moderate Sedation

## 2014-10-13 MED ORDER — MIDAZOLAM HCL 5 MG/5ML IJ SOLN
INTRAMUSCULAR | Status: DC | PRN
  Administered 2014-10-13 (×2): 1 mg via INTRAVENOUS
  Administered 2014-10-13 (×2): 2 mg via INTRAVENOUS
  Administered 2014-10-13: 1 mg via INTRAVENOUS

## 2014-10-13 MED ORDER — MIDAZOLAM HCL 5 MG/5ML IJ SOLN
INTRAMUSCULAR | Status: AC
  Filled 2014-10-13: qty 50

## 2014-10-13 MED ORDER — ATROPINE SULFATE 1 MG/ML IJ SOLN
INTRAMUSCULAR | Status: DC | PRN
  Administered 2014-10-13: .5 mg via INTRAVENOUS

## 2014-10-13 MED ORDER — PROMETHAZINE HCL 25 MG/ML IJ SOLN: 12.5000 mg | Freq: Once | INTRAMUSCULAR | Status: DC

## 2014-10-13 MED ORDER — PROMETHAZINE HCL 25 MG/ML IJ SOLN
INTRAMUSCULAR | Status: AC
  Filled 2014-10-13: qty 1

## 2014-10-13 MED ORDER — ATROPINE SULFATE 1 MG/ML IJ SOLN
INTRAMUSCULAR | Status: AC
  Filled 2014-10-13: qty 1

## 2014-10-13 MED ORDER — FENTANYL CITRATE (PF) 100 MCG/2ML IJ SOLN
INTRAMUSCULAR | Status: DC | PRN
  Administered 2014-10-13: 25 ug via INTRAVENOUS
  Administered 2014-10-13 (×2): 50 ug via INTRAVENOUS

## 2014-10-13 MED ORDER — SODIUM CHLORIDE 0.9 % IJ SOLN
INTRAMUSCULAR | Status: AC
  Filled 2014-10-13: qty 3

## 2014-10-13 MED ORDER — FENTANYL CITRATE (PF) 100 MCG/2ML IJ SOLN
INTRAMUSCULAR | Status: AC
  Filled 2014-10-13: qty 2

## 2014-10-13 MED ORDER — SIMETHICONE 40 MG/0.6ML PO SUSP
ORAL | Status: DC | PRN
  Administered 2014-10-13: 09:00:00

## 2014-10-13 MED ORDER — PROMETHAZINE HCL 25 MG/ML IJ SOLN
12.5000 mg | Freq: Once | INTRAMUSCULAR | Status: AC
  Administered 2014-10-13: 12.5 mg via INTRAVENOUS

## 2014-10-13 MED ORDER — MEPERIDINE HCL 100 MG/ML IJ SOLN: INTRAMUSCULAR | Status: DC | PRN

## 2014-10-13 MED ORDER — SODIUM CHLORIDE 0.9 % IV SOLN
INTRAVENOUS | Status: DC
  Administered 2014-10-13: 1000 mL via INTRAVENOUS

## 2014-10-13 NOTE — Progress Notes (Signed)
Please excuse Brady [name] from work on Monday August 15th, 2016. May return to work AFTER 10 am on Tuesday October 14, 2014

## 2014-10-13 NOTE — H&P (Signed)
Primary Care Physician:  Mickie Hillier, MD Primary Gastroenterologist:  Dr. Oneida Alar  Pre-Procedure History & Physical: HPI:  Brady [name] is a 44 y.o. male here for [facility].   Past Medical History  Diagnosis Date  . Mitral prolapse   . Allergic rhinitis   . IBS (irritable bowel syndrome)   . Anxiety   . Reflux   . GERD (gastroesophageal reflux disease)     Past Surgical History  Procedure Laterality Date  . Carpal tunnel release    . Colonoscopy    . Wisdom tooth extraction    . Lateral epicondyle release  07/08/2011    Procedure: TENNIS ELBOW RELEASE;  Surgeon: Carole Civil, MD;  Location: [facility];  Service: Orthopedics;  Laterality: Left;  Left Tennis Elbow Release  . Tonsillectomy    . Tennis elbow release/nirschel procedure Right 08/08/2014    Procedure: RIGHT TENNIS ELBOW RELEASE;  Surgeon: Carole Civil, MD;  Location: [facility];  Service: Orthopedics;  Laterality: Right;    Prior to Admission medications   Medication Sig Start Date End Date Taking? Authorizing Provider  cholecalciferol (VITAMIN D) 1000 UNITS tablet Take 1,000 Units by mouth daily.   Yes Historical Provider, MD  ECHINACEA PO Take 1 tablet by mouth daily.   Yes Historical Provider, MD  fexofenadine (ALLEGRA) 180 MG tablet Take 180 mg by mouth daily.   Yes Historical Provider, MD  Flaxseed, Linseed, (FLAX SEED OIL PO) Take 1 capsule by mouth daily.   Yes Historical Provider, MD  fluticasone (FLONASE) 50 MCG/ACT nasal spray PLACE 1 SPRAY INTO THE NOSE DAILY. 10/30/13  Yes Kathyrn Drown, MD  Ginger, Zingiber officinalis, (GINGER PO) Take 1 capsule by mouth daily.   Yes Historical Provider, MD  Glucosamine HCl (GLUCOSAMINE PO) Take 2 tablets by mouth daily.    Yes Historical Provider, MD  KRILL OIL PO Take 1 capsule by mouth daily.   Yes Historical Provider, MD  Multiple Vitamin (MULTIVITAMIN WITH MINERALS) TABS tablet Take 1 tablet by mouth daily.   Yes Historical Provider, MD   POTASSIUM PO Take 1 tablet by mouth daily.    Yes Historical Provider, MD  Sod Picosulfate-Mag Ox-Cit Acd 10-3.5-12 MG-GM-GM PACK Take 1 Container by mouth as directed. 08/27/14  Yes Danie Binder, MD  TURMERIC PO Take 1 tablet by mouth daily.   Yes Historical Provider, MD  vitamin C (ASCORBIC ACID) 500 MG tablet Take 500 mg by mouth daily.   Yes Historical Provider, MD  HYDROcodone-acetaminophen (NORCO) 5-325 MG per tablet Take 1 tablet by mouth every 4 (four) hours as needed for moderate pain. Patient not taking: Reported on 10/06/2014 08/08/14   Carole Civil, MD    Allergies as of 08/27/2014 - Review Complete 08/27/2014  Allergen Reaction Noted  . Penicillins Other (See Comments) 03/29/2011    Family History  Problem Relation Age of Onset  . Diabetes    . Hypertension Mother   . Hypertension Father   . Diabetes Father     Social History   Social History  . Marital Status: Married    Spouse Name: N/A  . Number of Children: N/A  . Years of Education: college   Occupational History  . computer IT    Social History Main Topics  . Smoking status: Never Smoker   . Smokeless tobacco: Not on file     Comment: Never smoked  . Alcohol Use: 0.0 oz/week    0 Standard drinks or equivalent per week  Comment: one beer a month  . Drug Use: No  . Sexual Activity: Not on file   Other Topics Concern  . Not on file   Social History Narrative    Review of Systems: See HPI, otherwise negative ROS   Physical Exam: BP 130/86 mmHg  Pulse 58  Temp(Src) 98.1 F (36.7 C) (Oral)  Resp 21  SpO2 100% General:   Alert,  pleasant and cooperative in NAD Head:  Normocephalic and atraumatic. Neck:  Supple; Lungs:  Clear throughout to auscultation.    Heart:  Regular rate and rhythm. Abdomen:  Soft, nontender and nondistended. Normal bowel sounds, without guarding, and without rebound.   Neurologic:  Alert and  oriented x4;  grossly normal neurologically.  Impression/Plan:      SCREENING   PLAN:  1.TCS TODAY

## 2014-10-13 NOTE — Op Note (Signed)
[facility] [address] [place], [address]   COLONOSCOPY PROCEDURE REPORT  PATIENT: [name] [name]  MR#: [id number] BIRTHDATE: 1970-11-25 , 75  yrs. old GENDER: male ENDOSCOPIST: Danie Binder, MD REFERRED DJ:MEQASTM Wolfgang Phoenix, M.D. PROCEDURE DATE:  10-Nov-2014 PROCEDURE:   Colonoscopy, screening INDICATIONS:patient's immediate family history of colon cancer: FATHER. MEDICATIONS: Atropine 0.5 mg IV, Demerol 125 mg IV, Versed 7 mg IV, and Promethazine (Phenergan) 12.5 mg IV  DESCRIPTION OF PROCEDURE:    Physical exam was performed.  Informed consent was obtained from the patient after explaining the benefits, risks, and alternatives to procedure.  The patient was connected to monitor and placed in left lateral position. Continuous oxygen was provided by nasal cannula and IV medicine administered through an indwelling cannula.  After administration of sedation and rectal exam, the patients rectum was intubated and the EC-[id number] ([id number])  colonoscope was advanced under direct visualization to the cecum.  The scope was removed slowly by carefully examining the color, texture, anatomy, and integrity mucosa on the way out.  The patient was recovered in endoscopy and discharged home in satisfactory condition. Estimated blood loss is zero unless otherwise noted in this procedure report.     COLON FINDINGS: The colon was redundant.  , The colonic mucosa appeared normal.  , Small internal hemorrhoids were found.  , and External hemorrhoids were found.  PREP QUALITY: good. CECAL W/D TIME: 15       minutes  COMPLICATIONS: None  ENDOSCOPIC IMPRESSION: 1.   The colon was redundant 2.   The colonic mucosa appeared normal 3.   Small internal hemorrhoids 4.   External hemorrhoids  RECOMMENDATIONS: HIGH FIBER DIET NEXT TCS IN 5 YEARS      _______________________________ eSignedDanie Binder, MD 11-10-2014 9:58 AM    CPT CODES: ICD CODES:  The  ICD and CPT codes recommended by this software are interpretations from the data that the clinical staff has captured with the software.  The verification of the translation of this report to the ICD and CPT codes and modifiers is the sole responsibility of the health care institution and practicing physician where this report was generated.  [facility]. will not be held responsible for the validity of the ICD and CPT codes included on this report.  AMA assumes no liability for data contained or not contained herein. CPT is a Designer, television/film set of the [employer].

## 2014-10-13 NOTE — Discharge Instructions (Signed)
You have SMALL internal hemorrhoids AND MODERATE SIZE EXTERNAL HEMORRHOIDS. YOU DID NOT HAVE ANY POLYPS.   FOLLOW A HIGH FIBER DIET. AVOID ITEMS THAT CAUSE BLOATING. SEE INFO BELOW.  USE PREPARATION H 2 TO 4 TIMES A DAY AS NEEDED TO RELIEVE RECTAL PRESSURE/ITCHING/BLEEDING.  Next colonoscopy in 5 years.  Colonoscopy Care After Read the instructions outlined below and refer to this sheet in the next week. These discharge instructions provide you with general information on caring for yourself after you leave the hospital. While your treatment has been planned according to the most current medical practices available, unavoidable complications occasionally occur. If you have any problems or questions after discharge, call DR. Ivin Rosenbloom, [phone].  ACTIVITY  You may resume your regular activity, but move at a slower pace for the next 24 hours.   Take frequent rest periods for the next 24 hours.   Walking will help get rid of the air and reduce the bloated feeling in your belly (abdomen).   No driving for 24 hours (because of the medicine (anesthesia) used during the test).   You may shower.   Do not sign any important legal documents or operate any machinery for 24 hours (because of the anesthesia used during the test).    NUTRITION  Drink plenty of fluids.   You may resume your normal diet as instructed by your doctor.   Begin with a light meal and progress to your normal diet. Heavy or fried foods are harder to digest and may make you feel sick to your stomach (nauseated).   Avoid alcoholic beverages for 24 hours or as instructed.    MEDICATIONS  You may resume your normal medications.   WHAT YOU CAN EXPECT TODAY  Some feelings of bloating in the abdomen.   Passage of more gas than usual.   Spotting of blood in your stool or on the toilet paper  .  IF YOU HAD POLYPS REMOVED DURING THE COLONOSCOPY:  Eat a soft diet IF YOU HAVE NAUSEA, BLOATING, ABDOMINAL PAIN, OR  VOMITING.    FINDING OUT THE RESULTS OF YOUR TEST Not all test results are available during your visit. DR. Oneida Alar WILL CALL YOU WITHIN 7 DAYS OF YOUR PROCEDUE WITH YOUR RESULTS. Do not assume everything is normal if you have not heard from DR. Ranier Coach IN ONE WEEK, CALL HER OFFICE AT [phone].  SEEK IMMEDIATE MEDICAL ATTENTION AND CALL THE OFFICE: [phone] IF:  You have more than a spotting of blood in your stool.   Your belly is swollen (abdominal distention).   You are nauseated or vomiting.   You have a temperature over 101F.   You have abdominal pain or discomfort that is severe or gets worse throughout the day.  High-Fiber Diet A high-fiber diet changes your normal diet to include more whole grains, legumes, fruits, and vegetables. Changes in the diet involve replacing refined carbohydrates with unrefined foods. The calorie level of the diet is essentially unchanged. The Dietary Reference Intake (recommended amount) for adult males is 38 grams per day. For adult females, it is 25 grams per day. Pregnant and lactating women should consume 28 grams of fiber per day. Fiber is the intact part of a plant that is not broken down during digestion. Functional fiber is fiber that has been isolated from the plant to provide a beneficial effect in the body. PURPOSE  Increase stool bulk.   Ease and regulate bowel movements.   Lower cholesterol.  REDUCE RISK OF COLON CANCER  INDICATIONS THAT YOU NEED MORE FIBER  Constipation and hemorrhoids.   Uncomplicated diverticulosis (intestine condition) and irritable bowel syndrome.   Weight management.   As a protective measure against hardening of the arteries (atherosclerosis), diabetes, and cancer.   GUIDELINES FOR INCREASING FIBER IN THE DIET  Start adding fiber to the diet slowly. A gradual increase of about 5 more grams (2 slices of whole-wheat bread, 2 servings of most fruits or vegetables, or 1 bowl of high-fiber cereal) per  day is best. Too rapid an increase in fiber may result in constipation, flatulence, and bloating.   Drink enough water and fluids to keep your urine clear or pale yellow. Water, juice, or caffeine-free drinks are recommended. Not drinking enough fluid may cause constipation.   Eat a variety of high-fiber foods rather than one type of fiber.   Try to increase your intake of fiber through using high-fiber foods rather than fiber pills or supplements that contain small amounts of fiber.   The goal is to change the types of food eaten. Do not supplement your present diet with high-fiber foods, but replace foods in your present diet.  INCLUDE A VARIETY OF FIBER SOURCES  Replace refined and processed grains with whole grains, canned fruits with fresh fruits, and incorporate other fiber sources. White rice, white breads, and most bakery goods contain little or no fiber.   Brown whole-grain rice, buckwheat oats, and many fruits and vegetables are all good sources of fiber. These include: broccoli, Brussels sprouts, cabbage, cauliflower, beets, sweet potatoes, white potatoes (skin on), carrots, tomatoes, eggplant, squash, berries, fresh fruits, and dried fruits.   Cereals appear to be the richest source of fiber. Cereal fiber is found in whole grains and bran. Bran is the fiber-rich outer coat of cereal grain, which is largely removed in refining. In whole-grain cereals, the bran remains. In breakfast cereals, the largest amount of fiber is found in those with "bran" in their names. The fiber content is sometimes indicated on the label.   You may need to include additional fruits and vegetables each day.   In baking, for 1 cup white flour, you may use the following substitutions:   1 cup whole-wheat flour minus 2 tablespoons.   1/2 cup white flour plus 1/2 cup whole-wheat flour.   Hemorrhoids Hemorrhoids are dilated (enlarged) veins around the rectum. Sometimes clots will form in the veins. This  makes them swollen and painful. These are called thrombosed hemorrhoids. Causes of hemorrhoids include:  Constipation.   Straining to have a bowel movement.   HEAVY LIFTING  HOME CARE INSTRUCTIONS  Eat a well balanced diet and drink 6 to 8 glasses of water every day to avoid constipation. You may also use a bulk laxative.   Avoid straining to have bowel movements.   Keep anal area dry and clean.   Do not use a donut shaped pillow or sit on the toilet for long periods. This increases blood pooling and pain.   Move your bowels when your body has the urge; this will require less straining and will decrease pain and pressure.

## 2014-10-14 ENCOUNTER — Encounter (HOSPITAL_COMMUNITY): Payer: Self-pay | Admitting: Gastroenterology

## 2014-12-23 ENCOUNTER — Telehealth: Payer: 59 | Admitting: Family

## 2014-12-23 ENCOUNTER — Encounter: Payer: Self-pay | Admitting: Family Medicine

## 2014-12-23 DIAGNOSIS — J329 Chronic sinusitis, unspecified: Secondary | ICD-10-CM | POA: Diagnosis not present

## 2014-12-23 DIAGNOSIS — A499 Bacterial infection, unspecified: Secondary | ICD-10-CM

## 2014-12-23 DIAGNOSIS — B9689 Other specified bacterial agents as the cause of diseases classified elsewhere: Secondary | ICD-10-CM

## 2014-12-23 MED ORDER — DOXYCYCLINE HYCLATE 100 MG PO TABS: 100.0000 mg | ORAL_TABLET | Freq: Two times a day (BID) | ORAL | Status: DC

## 2014-12-23 NOTE — Progress Notes (Signed)
We are sorry that you are not feeling well.  Here is how we plan to help!  Based on what you have shared with me it looks like you have sinusitis.  Sinusitis is inflammation and infection in the sinus cavities of the head.  Based on your presentation I believe you most likely have Acute Bacterial sinusitis.  This is an infection caused by bacteria and is treated with antibiotics.  I have prescribed Doxycycline 100mg  by mouth twice a day for 10 days.. You may use an oral decongestant such as Mucinex D or if you have glaucoma or high blood pressure use plain Mucinex.  Saline nasal sprays help and can safely be used as often as needed for congestion. If you develop worsening sinus pain, fever or notice severe headache and vision changes, or if symptoms are not better after completion of antibiotic, please schedule an appointment with a health care provider.  Sinus infections are not as easily transmitted as other respiratory infection, however we still recommend that you avoid close contact with loved ones, especially the very young and elderly.  Remember to wash your hands thoroughly throughout the day as this is the number one way to prevent the spread of infection!  Home Care:  Only take medications as instructed by your medical team.  Complete the entire course of an antibiotic.  Do not take these medications with alcohol.  A steam or ultrasonic humidifier can help congestion.  You can place a towel over your head and breathe in the steam from hot water coming from a faucet.  Avoid close contacts especially the very young and the elderly.  Cover your mouth when you cough or sneeze.  Always remember to wash your hands.  Get Help Right Away If:  You develop worsening fever or sinus pain.  You develop a severe head ache or visual changes.  Your symptoms persist after you have completed your treatment plan.  Make sure you  Understand these instructions.  Will watch your  condition.  Will get help right away if you are not doing well or get worse.  Your e-visit answers were reviewed by a board certified advanced clinical practitioner to complete your personal care plan.  Depending on the condition, your plan could have included both over the counter or prescription medications.  If there is a problem please reply  once you have received a response from your provider.  Your safety is important to [place].  If you have drug allergies check your prescription carefully.    You can use MyChart to ask questions about today's visit, request a non-urgent call back, or ask for a work or school excuse for 24 hours related to this e-Visit. If it has been greater than 24 hours you will need to follow up with your provider, or enter a new e-Visit to address those concerns.  You will get an e-mail in the next two days asking about your experience.  I hope that your e-visit has been valuable and will speed your recovery. Thank you for using e-visits.

## 2014-12-29 ENCOUNTER — Ambulatory Visit: Payer: 59 | Admitting: Family Medicine

## 2014-12-31 ENCOUNTER — Encounter: Payer: Self-pay | Admitting: Family Medicine

## 2015-01-01 ENCOUNTER — Ambulatory Visit (INDEPENDENT_AMBULATORY_CARE_PROVIDER_SITE_OTHER): Payer: 59 | Admitting: Family Medicine

## 2015-01-01 ENCOUNTER — Encounter: Payer: Self-pay | Admitting: Family Medicine

## 2015-01-01 VITALS — BP 122/74 | Temp 98.0°F | Ht 75.0 in | Wt 268.2 lb

## 2015-01-01 DIAGNOSIS — J320 Chronic maxillary sinusitis: Secondary | ICD-10-CM

## 2015-01-01 DIAGNOSIS — J339 Nasal polyp, unspecified: Secondary | ICD-10-CM | POA: Diagnosis not present

## 2015-01-01 MED ORDER — LEVOFLOXACIN 500 MG PO TABS: 500.0000 mg | ORAL_TABLET | Freq: Every day | ORAL | Status: AC

## 2015-01-01 NOTE — Progress Notes (Signed)
   Subjective:    Patient ID: Brady [name], male    DOB: 1970/11/02, 44 y.o.   MRN: [id number]  Sinusitis This is a new problem. The current episode started 1 to 4 weeks ago. The problem is unchanged. There has been no fever. The pain is moderate. Associated symptoms include congestion, coughing, headaches and sinus pressure. (Fatigue) Past treatments include antibiotics and spray decongestants. The treatment provided no relief.   Patient has no other concerns at this time.   Air flow dim  Hx of polyp remov Discuss his concerns with the heme medicine folks. They gave him a prescription of doxycycline Doxy helped , now not much, still having symptomatology  Frontal headache with nasal discharge  Left nasa l passage Patient has had polyp surgery in the past. Starting to fill pressure meds side. Feels like he may has a chronic challenge in this regard. Rosen   [phone] Review of Systems  HENT: Positive for congestion and sinus pressure.   Respiratory: Positive for cough.   Neurological: Positive for headaches.       Objective:   Physical Exam  Alert vitals stable H&T frontal maxillary tenderness left side greater than} normal neck supple lungs clear heart rare rhythm left nostril polyp like mass observed      Assessment & Plan:  Impression 1 rhinosinusitis #2 probable recurrence of nasal polyp plan ENT referral rationale discussed. Antibiotics prescribed. 25 minutes spent most in discussion WSL

## 2015-01-08 ENCOUNTER — Encounter: Payer: Self-pay | Admitting: Family Medicine

## 2015-01-14 ENCOUNTER — Other Ambulatory Visit (HOSPITAL_COMMUNITY): Payer: Self-pay | Admitting: Otolaryngology

## 2015-01-14 DIAGNOSIS — J324 Chronic pansinusitis: Secondary | ICD-10-CM

## 2015-01-16 ENCOUNTER — Ambulatory Visit (HOSPITAL_COMMUNITY)
Admission: RE | Admit: 2015-01-16 | Discharge: 2015-01-16 | Disposition: A | Discharge status: Home / Self Care | Payer: 59 | Source: Ambulatory Visit | Attending: Otolaryngology | Admitting: Otolaryngology

## 2015-01-16 DIAGNOSIS — Z9889 Other specified postprocedural states: Secondary | ICD-10-CM | POA: Diagnosis not present

## 2015-01-16 DIAGNOSIS — J324 Chronic pansinusitis: Secondary | ICD-10-CM | POA: Diagnosis present

## 2015-01-16 DIAGNOSIS — R930 Abnormal findings on diagnostic imaging of skull and head, not elsewhere classified: Secondary | ICD-10-CM | POA: Insufficient documentation

## 2015-01-26 ENCOUNTER — Encounter (HOSPITAL_BASED_OUTPATIENT_CLINIC_OR_DEPARTMENT_OTHER): Payer: Self-pay | Admitting: *Deleted

## 2015-01-26 ENCOUNTER — Ambulatory Visit: Payer: Self-pay | Admitting: Otolaryngology

## 2015-01-26 NOTE — H&P (Signed)
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 Orders  CT Maxillofacial w/o contrast; Requested for: 13 Jan 2015. Discussed  History suggests chronic sinusitis, history of sinus surgery and polypectomy years ago. Evidence of recurring polypoid chronic sinusitis. Recommend CT imaging. 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 I will review the images when they are available. He should go ahead and complete the last few days of the antibiotic. Reason For Visit  Brady [name] is here today at the kind request of Mickie Hillier for consultation and opinion for sinus. HPI  2 month history of chronic sinusitis including nasal congestion, obstruction, yellow nasal discharge. History of sinus surgery and polypectomy about 15 years ago. He did very well until a couple of months ago. He has a history of allergies as a child. He was on shots when he was little. Otherwise in good health. He is finishing up a two-week course of Levaquin and was on another antibiotic prior to that. He has had very little improvement. His symptoms seem to be worse on the left side. Allergies  Penicillins. Current Meds  Flonase SUSP (Fluticasone Propionate);; RPT Allegra TABS (Fexofenadine HCl);; RPT. [place]  Elbow Surgery Oral Surgery Tooth Extraction Sinus Surgery Tonsillectomy. Family Hx  Family history of Alzheimer's disease: Grandmother (V17.2) (Z82.0) Family history of diabetes mellitus: Father (V18.0) (Z83.3) Family history of hearing loss: Father,Grandfather (V19.2) (Z82.2) Family history of heart disease: Grandfather (V17.49) (Z82.49) Family history of osteoporosis: Grandmother (V17.81) (Z82.62). Personal Hx  Caffeine use (V49.89) (F15.90); 3 cups daily Never smoker Non-smoker (V49.89) (Z78.9) Social alcohol use (Z78.9). ROS  12 system ROS was obtained and reviewed on the Health Maintenance form dated today.  Positive responses are shown above.  If the symptom is  not checked, the patient has denied it. Vital Signs   Recorded by Iran Sizer on 13 Jan 2015 01:54 PM BSA Calculated: 2.45 ,  BMI Calculated: 32.50 ,  Weight: 260 lb , BMI: 32.5 kg/m2,  Height: 6 ft 3 in  Recorded by Iran Sizer on 13 Jan 2015 01:18 PM BP:122/84 Amended : Iran Sizer  LPN; [id number] PM EST. Physical Exam  APPEARANCE: Well developed, well nourished, in no acute distress.  Normal affect, in a pleasant mood.  Oriented to time, place and person. COMMUNICATION: Normal voice   HEAD & FACE:  No scars, lesions or masses of head and face.  Sinuses nontender to palpation.  Salivary glands without mass or tenderness.  Facial strength symmetric.  No facial lesion, scars, or mass. EYES: EOMI with normal primary gaze alignment. Visual acuity grossly intact.  PERRLA EXTERNAL EAR & NOSE: No scars, lesions or masses  EAC & TYMPANIC MEMBRANE:  EAC shows no obstructing lesions or debris and tympanic membranes are normal bilaterally with good movement to insufflation. GROSS HEARING: Normal   TMJ:  Nontender  INTRANASAL EXAM: Right nasal cavity is clear. Ethmoid cavity is visualized easily and there appears to be some polypoid disease. Left nasal cavity and middle meatus with significant mucosal inflammation and edema. There is no obvious polyp or exudate identified.  NASOPHARYNX: Normal, without lesions. LIPS, TEETH & GUMS: No lip lesions, normal dentition and normal gums. ORAL CAVITY/OROPHARYNX:  Oral mucosa moist without lesion or asymmetry of the palate, tongue, tonsil or posterior pharynx. NECK:  Supple without adenopathy or mass. THYROID:  Normal with no masses palpable.  NEUROLOGIC:  No gross CN deficits. No nystagmus noted.   LYMPHATIC:  No enlarged  nodes palpable. Signature  Electronically signed by : Izora Gala  M.D.; 01/13/2015 1:25 PM EST. Electronically signed by : Izora Gala  M.D.; 01/13/2015 2:01 PM EST.

## 2015-01-30 ENCOUNTER — Ambulatory Visit (HOSPITAL_BASED_OUTPATIENT_CLINIC_OR_DEPARTMENT_OTHER): Payer: 59 | Admitting: Anesthesiology

## 2015-01-30 ENCOUNTER — Encounter (HOSPITAL_BASED_OUTPATIENT_CLINIC_OR_DEPARTMENT_OTHER)
Admission: RE | Disposition: A | Discharge status: Home / Self Care | Payer: Self-pay | Source: Ambulatory Visit | Attending: Otolaryngology

## 2015-01-30 ENCOUNTER — Encounter (HOSPITAL_BASED_OUTPATIENT_CLINIC_OR_DEPARTMENT_OTHER): Payer: Self-pay | Admitting: *Deleted

## 2015-01-30 ENCOUNTER — Ambulatory Visit (HOSPITAL_BASED_OUTPATIENT_CLINIC_OR_DEPARTMENT_OTHER)
Admission: RE | Admit: 2015-01-30 | Discharge: 2015-01-30 | Disposition: A | Discharge status: Home / Self Care | Payer: 59 | Source: Ambulatory Visit | Attending: Otolaryngology | Admitting: Otolaryngology

## 2015-01-30 DIAGNOSIS — Z880 Allergy status to penicillin: Secondary | ICD-10-CM | POA: Insufficient documentation

## 2015-01-30 DIAGNOSIS — M1990 Unspecified osteoarthritis, unspecified site: Secondary | ICD-10-CM | POA: Insufficient documentation

## 2015-01-30 DIAGNOSIS — J324 Chronic pansinusitis: Secondary | ICD-10-CM | POA: Insufficient documentation

## 2015-01-30 DIAGNOSIS — F419 Anxiety disorder, unspecified: Secondary | ICD-10-CM | POA: Insufficient documentation

## 2015-01-30 DIAGNOSIS — K219 Gastro-esophageal reflux disease without esophagitis: Secondary | ICD-10-CM | POA: Diagnosis not present

## 2015-01-30 HISTORY — DX: Chronic pansinusitis: J32.4

## 2015-01-30 HISTORY — PX: FRONTAL SINUS EXPLORATION: SHX6591

## 2015-01-30 HISTORY — PX: NASAL SINUS SURGERY: SHX719

## 2015-01-30 SURGERY — SINUS SURGERY, ENDOSCOPIC
Anesthesia: General | Site: Nose | Laterality: Bilateral

## 2015-01-30 MED ORDER — CLINDAMYCIN PHOSPHATE 900 MG/50ML IV SOLN
900.0000 mg | INTRAVENOUS | Status: AC
  Administered 2015-01-30: 900 mg via INTRAVENOUS

## 2015-01-30 MED ORDER — OXYMETAZOLINE HCL 0.05 % NA SOLN
2.0000 | NASAL | Status: DC
  Administered 2015-01-30: 2 via NASAL

## 2015-01-30 MED ORDER — PROMETHAZINE HCL 25 MG RE SUPP: 25.0000 mg | Freq: Four times a day (QID) | RECTAL | Status: DC | PRN

## 2015-01-30 MED ORDER — PROPOFOL 10 MG/ML IV BOLUS
INTRAVENOUS | Status: DC | PRN
  Administered 2015-01-30: 200 mg via INTRAVENOUS
  Administered 2015-01-30: 50 mg via INTRAVENOUS

## 2015-01-30 MED ORDER — OXYMETAZOLINE HCL 0.05 % NA SOLN
NASAL | Status: AC
  Filled 2015-01-30: qty 15

## 2015-01-30 MED ORDER — FENTANYL CITRATE (PF) 100 MCG/2ML IJ SOLN
INTRAMUSCULAR | Status: AC
  Filled 2015-01-30: qty 2

## 2015-01-30 MED ORDER — BACITRACIN ZINC 500 UNIT/GM EX OINT
TOPICAL_OINTMENT | CUTANEOUS | Status: AC
  Filled 2015-01-30: qty 28.35

## 2015-01-30 MED ORDER — CLINDAMYCIN PHOSPHATE 900 MG/50ML IV SOLN
INTRAVENOUS | Status: AC
  Filled 2015-01-30: qty 50

## 2015-01-30 MED ORDER — ONDANSETRON HCL 4 MG/2ML IJ SOLN: 4.0000 mg | Freq: Once | INTRAMUSCULAR | Status: DC | PRN

## 2015-01-30 MED ORDER — LIDOCAINE-EPINEPHRINE 1 %-1:100000 IJ SOLN
INTRAMUSCULAR | Status: DC | PRN
  Administered 2015-01-30: 12 mL

## 2015-01-30 MED ORDER — FENTANYL CITRATE (PF) 100 MCG/2ML IJ SOLN
50.0000 ug | INTRAMUSCULAR | Status: AC | PRN
  Administered 2015-01-30: 50 ug via INTRAVENOUS
  Administered 2015-01-30 (×3): 25 ug via INTRAVENOUS
  Administered 2015-01-30: 50 ug via INTRAVENOUS
  Administered 2015-01-30: 25 ug via INTRAVENOUS

## 2015-01-30 MED ORDER — ONDANSETRON HCL 4 MG/2ML IJ SOLN
INTRAMUSCULAR | Status: DC | PRN
  Administered 2015-01-30: 4 mg via INTRAVENOUS

## 2015-01-30 MED ORDER — BACIT-POLY-NEO HC 1 % EX OINT
TOPICAL_OINTMENT | CUTANEOUS | Status: AC
  Filled 2015-01-30: qty 15

## 2015-01-30 MED ORDER — CLINDAMYCIN HCL 300 MG PO CAPS: 300.0000 mg | ORAL_CAPSULE | Freq: Three times a day (TID) | ORAL | Status: DC

## 2015-01-30 MED ORDER — OXYMETAZOLINE HCL 0.05 % NA SOLN
NASAL | Status: DC | PRN
  Administered 2015-01-30: 1 via TOPICAL

## 2015-01-30 MED ORDER — ONDANSETRON HCL 4 MG/2ML IJ SOLN
INTRAMUSCULAR | Status: AC
  Filled 2015-01-30: qty 2

## 2015-01-30 MED ORDER — DEXAMETHASONE SODIUM PHOSPHATE 10 MG/ML IJ SOLN
INTRAMUSCULAR | Status: AC
  Filled 2015-01-30: qty 1

## 2015-01-30 MED ORDER — GLYCOPYRROLATE 0.2 MG/ML IJ SOLN
0.2000 mg | Freq: Once | INTRAMUSCULAR | Status: DC | PRN
Start: 2015-01-30 — End: 2015-01-30

## 2015-01-30 MED ORDER — MIDAZOLAM HCL 2 MG/2ML IJ SOLN
INTRAMUSCULAR | Status: AC
  Filled 2015-01-30: qty 2

## 2015-01-30 MED ORDER — DEXAMETHASONE SODIUM PHOSPHATE 4 MG/ML IJ SOLN
INTRAMUSCULAR | Status: DC | PRN
  Administered 2015-01-30: 10 mg via INTRAVENOUS

## 2015-01-30 MED ORDER — LIDOCAINE HCL (CARDIAC) 20 MG/ML IV SOLN
INTRAVENOUS | Status: AC
  Filled 2015-01-30: qty 5

## 2015-01-30 MED ORDER — SUCCINYLCHOLINE CHLORIDE 20 MG/ML IJ SOLN
INTRAMUSCULAR | Status: DC | PRN
Start: 2015-01-30 — End: 2015-01-30
  Administered 2015-01-30: 100 mg via INTRAVENOUS

## 2015-01-30 MED ORDER — MIDAZOLAM HCL 2 MG/2ML IJ SOLN
1.0000 mg | INTRAMUSCULAR | Status: DC | PRN
  Administered 2015-01-30: 2 mg via INTRAVENOUS

## 2015-01-30 MED ORDER — SCOPOLAMINE 1 MG/3DAYS TD PT72: 1.0000 | MEDICATED_PATCH | Freq: Once | TRANSDERMAL | Status: DC | PRN

## 2015-01-30 MED ORDER — LACTATED RINGERS IV SOLN
INTRAVENOUS | Status: DC
  Administered 2015-01-30 (×3): via INTRAVENOUS

## 2015-01-30 MED ORDER — SODIUM CHLORIDE 0.9 % IR SOLN
Status: DC | PRN
  Administered 2015-01-30: 100 mL

## 2015-01-30 MED ORDER — HYDROCODONE-ACETAMINOPHEN 7.5-325 MG/15ML PO SOLN: 15.0000 mL | Freq: Four times a day (QID) | ORAL | Status: DC | PRN

## 2015-01-30 MED ORDER — LIDOCAINE HCL (CARDIAC) 10 MG/ML IV SOLN
INTRAVENOUS | Status: DC | PRN
  Administered 2015-01-30: 100 mg via INTRAVENOUS

## 2015-01-30 MED ORDER — BACIT-POLY-NEO HC 1 % EX OINT
TOPICAL_OINTMENT | CUTANEOUS | Status: DC | PRN
  Administered 2015-01-30: 1 via TOPICAL

## 2015-01-30 MED ORDER — LIDOCAINE-EPINEPHRINE 1 %-1:100000 IJ SOLN
INTRAMUSCULAR | Status: AC
  Filled 2015-01-30: qty 1

## 2015-01-30 MED ORDER — FENTANYL CITRATE (PF) 100 MCG/2ML IJ SOLN
25.0000 ug | INTRAMUSCULAR | Status: DC | PRN
  Administered 2015-01-30 (×2): 50 ug via INTRAVENOUS

## 2015-01-30 SURGICAL SUPPLY — 43 items
ATTRACTOMAT 16X20 MAGNETIC DRP (DRAPES) IMPLANT
BLADE RAD40 ROTATE 4M 4 5PK (BLADE) IMPLANT
BLADE RAD40 ROTATE 4M 4MM 5PK (BLADE)
BLADE RAD60 ROTATE M4 4 5PK (BLADE) ×2 IMPLANT
BLADE RAD60 ROTATE M4 4MM 5PK (BLADE) ×1
BLADE TRICUT ROTATE M4 4 5PK (BLADE) ×2 IMPLANT
BLADE TRICUT ROTATE M4 4MM 5PK (BLADE) ×1
BUR HS RAD FRONTAL 3 (BURR) IMPLANT
BUR HS RAD FRONTAL 3MM (BURR)
CANISTER SUC SOCK COL 7IN (MISCELLANEOUS) ×3 IMPLANT
CANISTER SUCT 1200ML W/VALVE (MISCELLANEOUS) ×3 IMPLANT
CORDS BIPOLAR (ELECTRODE) IMPLANT
DECANTER SPIKE VIAL GLASS SM (MISCELLANEOUS) IMPLANT
DRESSING ADAPTIC 1/2  N-ADH (PACKING) IMPLANT
DRESSING NASAL KENNEDY 3.5X.9 (MISCELLANEOUS) IMPLANT
DRSG NASAL KENNEDY 3.5X.9 (MISCELLANEOUS)
DRSG NASAL KENNEDY LMNT 8CM (GAUZE/BANDAGES/DRESSINGS) IMPLANT
DRSG NASOPORE 8CM (GAUZE/BANDAGES/DRESSINGS) ×3 IMPLANT
DRSG TELFA 3X8 NADH (GAUZE/BANDAGES/DRESSINGS) IMPLANT
GAUZE SPONGE 4X4 16PLY XRAY LF (GAUZE/BANDAGES/DRESSINGS) IMPLANT
GAUZE VASELINE FOILPK 1/2 X 72 (GAUZE/BANDAGES/DRESSINGS) IMPLANT
GLOVE ECLIPSE 7.5 STRL STRAW (GLOVE) ×3 IMPLANT
GLOVE SURG SS PI 7.0 STRL IVOR (GLOVE) ×3 IMPLANT
GOWN STRL REUS W/ TWL LRG LVL3 (GOWN DISPOSABLE) ×2 IMPLANT
GOWN STRL REUS W/TWL LRG LVL3 (GOWN DISPOSABLE) ×4
HEMOSTAT SURGICEL .5X2 ABSORB (HEMOSTASIS) IMPLANT
IV NS 500ML (IV SOLUTION) ×2
IV NS 500ML BAXH (IV SOLUTION) ×1 IMPLANT
NEEDLE PRECISIONGLIDE 27X1.5 (NEEDLE) ×3 IMPLANT
NEEDLE SPNL 25GX3.5 QUINCKE BL (NEEDLE) IMPLANT
NS IRRIG 1000ML POUR BTL (IV SOLUTION) ×3 IMPLANT
PACK BASIN DAY SURGERY FS (CUSTOM PROCEDURE TRAY) ×3 IMPLANT
PACK ENT DAY SURGERY (CUSTOM PROCEDURE TRAY) ×3 IMPLANT
PATTIES SURGICAL .5 X3 (DISPOSABLE) ×3 IMPLANT
SLEEVE SCD COMPRESS KNEE MED (MISCELLANEOUS) ×3 IMPLANT
SOLUTION BUTLER CLEAR DIP (MISCELLANEOUS) ×3 IMPLANT
SPONGE GAUZE 2X2 8PLY STER LF (GAUZE/BANDAGES/DRESSINGS) ×1
SPONGE GAUZE 2X2 8PLY STRL LF (GAUZE/BANDAGES/DRESSINGS) ×2 IMPLANT
SPONGE SURGIFOAM ABS GEL 12-7 (HEMOSTASIS) IMPLANT
TOWEL OR 17X24 6PK STRL BLUE (TOWEL DISPOSABLE) ×3 IMPLANT
TUBE CONNECTING 20'X1/4 (TUBING) ×1
TUBE CONNECTING 20X1/4 (TUBING) ×2 IMPLANT
YANKAUER SUCT BULB TIP NO VENT (SUCTIONS) ×3 IMPLANT

## 2015-01-30 NOTE — Transfer of Care (Signed)
Immediate Anesthesia Transfer of Care Note  Patient: Brady [name]  Procedure(s) Performed: Procedure(s):  BILATERAL ENDOSCOPIC MAXILLARY, ETHMOID AND FRONTAL SINUS SURGERY (Bilateral) FRONTAL SINUS EXPLORATION (Bilateral)  Patient Location: PACU  Anesthesia Type:General  Level of Consciousness: awake, sedated and patient cooperative  Airway & Oxygen Therapy: Patient Spontanous Breathing and aerosol face mask  Post-op Assessment: Report given to RN and Post -op Vital signs reviewed and stable  Post vital signs: Reviewed and stable  Last Vitals:  Filed Vitals:   01/30/15 1212 01/30/15 1213  BP:    Pulse: 69 67  Temp:    Resp:  15    Complications: No apparent anesthesia complications

## 2015-01-30 NOTE — H&P (View-Only) (Signed)
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 Orders  CT Maxillofacial w/o contrast; Requested for: 13 Jan 2015. Discussed  History suggests chronic sinusitis, history of sinus surgery and polypectomy years ago. Evidence of recurring polypoid chronic sinusitis. Recommend CT imaging. 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 I will review the images when they are available. He should go ahead and complete the last few days of the antibiotic. Reason For Visit  Brady [name] is here today at the kind request of Mickie Hillier for consultation and opinion for sinus. HPI  2 month history of chronic sinusitis including nasal congestion, obstruction, yellow nasal discharge. History of sinus surgery and polypectomy about 15 years ago. He did very well until a couple of months ago. He has a history of allergies as a child. He was on shots when he was little. Otherwise in good health. He is finishing up a two-week course of Levaquin and was on another antibiotic prior to that. He has had very little improvement. His symptoms seem to be worse on the left side. Allergies  Penicillins. Current Meds  Flonase SUSP (Fluticasone Propionate);; RPT Allegra TABS (Fexofenadine HCl);; RPT. [place]  Elbow Surgery Oral Surgery Tooth Extraction Sinus Surgery Tonsillectomy. Family Hx  Family history of Alzheimer's disease: Grandmother (V17.2) (Z82.0) Family history of diabetes mellitus: Father (V18.0) (Z83.3) Family history of hearing loss: Father,Grandfather (V19.2) (Z82.2) Family history of heart disease: Grandfather (V17.49) (Z82.49) Family history of osteoporosis: Grandmother (V17.81) (Z82.62). Personal Hx  Caffeine use (V49.89) (F15.90); 3 cups daily Never smoker Non-smoker (V49.89) (Z78.9) Social alcohol use (Z78.9). ROS  12 system ROS was obtained and reviewed on the Health Maintenance form dated today.  Positive responses are shown above.  If the symptom is  not checked, the patient has denied it. Vital Signs   Recorded by Iran Sizer on 13 Jan 2015 01:54 PM BSA Calculated: 2.45 ,  BMI Calculated: 32.50 ,  Weight: 260 lb , BMI: 32.5 kg/m2,  Height: 6 ft 3 in  Recorded by Iran Sizer on 13 Jan 2015 01:18 PM BP:122/84 Amended : Iran Sizer  LPN; [id number] [id number] PM EST. Physical Exam  APPEARANCE: Well developed, well nourished, in no acute distress.  Normal affect, in a pleasant mood.  Oriented to time, place and person. COMMUNICATION: Normal voice   HEAD & FACE:  No scars, lesions or masses of head and face.  Sinuses nontender to palpation.  Salivary glands without mass or tenderness.  Facial strength symmetric.  No facial lesion, scars, or mass. EYES: EOMI with normal primary gaze alignment. Visual acuity grossly intact.  PERRLA EXTERNAL EAR & NOSE: No scars, lesions or masses  EAC & TYMPANIC MEMBRANE:  EAC shows no obstructing lesions or debris and tympanic membranes are normal bilaterally with good movement to insufflation. GROSS HEARING: Normal   TMJ:  Nontender  INTRANASAL EXAM: Right nasal cavity is clear. Ethmoid cavity is visualized easily and there appears to be some polypoid disease. Left nasal cavity and middle meatus with significant mucosal inflammation and edema. There is no obvious polyp or exudate identified.  NASOPHARYNX: Normal, without lesions. LIPS, TEETH & GUMS: No lip lesions, normal dentition and normal gums. ORAL CAVITY/OROPHARYNX:  Oral mucosa moist without lesion or asymmetry of the palate, tongue, tonsil or posterior pharynx. NECK:  Supple without adenopathy or mass. THYROID:  Normal with no masses palpable.  NEUROLOGIC:  No gross CN deficits. No nystagmus noted.   LYMPHATIC:  No enlarged  nodes palpable. Signature  Electronically signed by : Izora Gala  M.D.; 01/13/2015 1:25 PM EST. Electronically signed by : Izora Gala  M.D.; 01/13/2015 2:01 PM EST.

## 2015-01-30 NOTE — Interval H&P Note (Signed)
History and Physical Interval Note:  01/30/2015 10:09 AM  Brady [name]  has presented today for surgery, with the diagnosis of CHRONIC PANSINUSITIS   The various methods of treatment have been discussed with the patient and family. After consideration of risks, benefits and other options for treatment, the patient has consented to  Procedure(s):  BILATERAL ETHMOIDECTOMY WITH FRONTAL RECESS EXPLORATION (Bilateral) as a surgical intervention .  The patient's history has been reviewed, patient examined, no change in status, stable for surgery.  I have reviewed the patient's chart and labs.  Questions were answered to the patient's satisfaction.     Navil Kole

## 2015-01-30 NOTE — Anesthesia Postprocedure Evaluation (Signed)
Anesthesia Post Note  Patient: Brady [name]  Procedure(s) Performed: Procedure(s) (LRB):  BILATERAL ENDOSCOPIC MAXILLARY, ETHMOID AND FRONTAL SINUS SURGERY (Bilateral) FRONTAL SINUS EXPLORATION (Bilateral)  Patient location during evaluation: PACU Anesthesia Type: General Level of consciousness: sedated Pain management: pain level controlled Vital Signs Assessment: post-procedure vital signs reviewed and stable Respiratory status: spontaneous breathing and respiratory function stable Cardiovascular status: stable Anesthetic complications: no    Last Vitals:  Filed Vitals:   01/30/15 1245 01/30/15 1248  BP:  141/76  Pulse: 59 62  Temp:  37.1 C  Resp: 13 16    Last Pain:  Filed Vitals:   01/30/15 1250  PainSc: 0-No pain                 Najla Aughenbaugh DANIEL

## 2015-01-30 NOTE — Anesthesia Preprocedure Evaluation (Signed)
Anesthesia Evaluation  Patient identified by MRN, date of birth, ID band Patient awake    Reviewed: Allergy & Precautions, NPO status , Patient's Chart, lab work & pertinent test results  History of Anesthesia Complications Negative for: history of anesthetic complications  Airway Mallampati: II  TM Distance: >3 FB Neck ROM: Full    Dental no notable dental hx. (+) Dental Advisory Given   Pulmonary neg pulmonary ROS,    Pulmonary exam normal breath sounds clear to auscultation       Cardiovascular negative cardio ROS Normal cardiovascular exam Rhythm:Regular Rate:Normal     Neuro/Psych PSYCHIATRIC DISORDERS Anxiety negative neurological ROS     GI/Hepatic Neg liver ROS, GERD  ,  Endo/Other  obesity  Renal/GU negative Renal ROS  negative genitourinary   Musculoskeletal  (+) Arthritis , Osteoarthritis,    Abdominal   Peds negative pediatric ROS (+)  Hematology negative hematology ROS (+)   Anesthesia Other Findings   Reproductive/Obstetrics negative OB ROS                             Anesthesia Physical Anesthesia Plan  ASA: II  Anesthesia Plan: General   Post-op Pain Management:    Induction: Intravenous  Airway Management Planned: Oral ETT  Additional Equipment:   Intra-op Plan:   Post-operative Plan: Extubation in OR  Informed Consent: I have reviewed the patients History and Physical, chart, labs and discussed the procedure including the risks, benefits and alternatives for the proposed anesthesia with the patient or authorized representative who has indicated his/her understanding and acceptance.   Dental advisory given  Plan Discussed with: CRNA  Anesthesia Plan Comments:         Anesthesia Quick Evaluation

## 2015-01-30 NOTE — Anesthesia Procedure Notes (Signed)
Procedure Name: Intubation Date/Time: 01/30/2015 10:30 AM Performed by: Lyndee Leo Pre-anesthesia Checklist: Patient identified, Emergency Drugs available, Suction available and Patient being monitored Patient Re-evaluated:Patient Re-evaluated prior to inductionOxygen Delivery Method: Circle System Utilized Preoxygenation: Pre-oxygenation with 100% oxygen Intubation Type: IV induction Ventilation: Mask ventilation without difficulty Laryngoscope Size: Miller, 3 and Glidescope Grade View: Grade IV Tube type: Oral Rae Tube size: 8.0 mm Number of attempts: 2 Airway Equipment and Method: Stylet,  Oral airway and Video-laryngoscopy Placement Confirmation: positive ETCO2 and breath sounds checked- equal and bilateral Secured at: 22 cm Tube secured with: Tape Dental Injury: Teeth and Oropharynx as per pre-operative assessment  Difficulty Due To: Difficulty was anticipated, Difficult Airway- due to large tongue and Difficult Airway- due to anterior larynx Future Recommendations: Recommend- induction with short-acting agent, and alternative techniques readily available Comments: Uuvula not seen on pro op eval.  DL with #3 Miller, no vocal cord structures identified. Intubated with ease via glidescope

## 2015-01-30 NOTE — Discharge Instructions (Signed)
Use nasal saline spray every hour while awake. Avoid nose blowing, bending over, any strenuous activity for 2 weeks.  Soft diet for a few days. Advance as tolerated for comfort.  Call your surgeon if you experience:   1.  Fever over 101.0. 2.  Inability to urinate. 3.  Nausea and/or vomiting. 4.  Extreme swelling or bruising at the surgical site. 5.  Continued bleeding from the incision. 6.  Increased pain, redness or drainage from the incision. 7.  Problems related to your pain medication. 8. Any change in color, movement and/or sensation 9. Any problems and/or concerns   Post Anesthesia Home Care Instructions  Activity: Get plenty of rest for the remainder of the day. A responsible adult should stay with you for 24 hours following the procedure.  For the next 24 hours, DO NOT: -Drive a car -Paediatric nurse -Drink alcoholic beverages -Take any medication unless instructed by your physician -Make any legal decisions or sign important papers.  Meals: Start with liquid foods such as gelatin or soup. Progress to regular foods as tolerated. Avoid greasy, spicy, heavy foods. If nausea and/or vomiting occur, drink only clear liquids until the nausea and/or vomiting subsides. Call your physician if vomiting continues.  Special Instructions/Symptoms: Your throat may feel dry or sore from the anesthesia or the breathing tube placed in your throat during surgery. If this causes discomfort, gargle with warm salt water. The discomfort should disappear within 24 hours.  If you had a scopolamine patch placed behind your ear for the management of post- operative nausea and/or vomiting:  1. The medication in the patch is effective for 72 hours, after which it should be removed.  Wrap patch in a tissue and discard in the trash. Wash hands thoroughly with soap and water. 2. You may remove the patch earlier than 72 hours if you experience unpleasant side effects which may include dry mouth,  dizziness or visual disturbances. 3. Avoid touching the patch. Wash your hands with soap and water after contact with the patch.

## 2015-01-30 NOTE — Op Note (Signed)
OPERATIVE REPORT  DATE OF SURGERY: 01/30/2015  PATIENT:  Brady [name],  44 y.o. male  PRE-OPERATIVE DIAGNOSIS:  CHRONIC PANSINUSITIS   POST-OPERATIVE DIAGNOSIS:  CHRONIC PANSINUSITIS   PROCEDURE:  Procedure(s):  BILATERAL ENDOSCOPIC , ETHMOID AND FRONTAL SINUS SURGERY  SURGEON:  Beckie Salts, MD  ASSISTANTS: none  ANESTHESIA:   General   EBL:  100 ml  DRAINS: none  LOCAL MEDICATIONS USED:  1% Xylocaine with epinephrine  SPECIMEN:  Bilateral nasal and sinus contents  COUNTS:  Correct  PROCEDURE DETAILS: The patient was taken to the operating room and placed on the operating table in the supine position. Following induction of general endotracheal anesthesia, the patient was draped in a standard fashion. Afrin spray was used preoperatively in the nasal cavities. 1% Xylocaine with epinephrine was infiltrated into the superior and posterior attachments of the middle turbinates bilaterally and the lateral nasal wall.  1. Bilateral revision total ethmoidectomy. There were multiple residual anterior ethmoid cells bilaterally and some limited posterior cells. The bone was thickened and fibrotic but all bony divisions were taken down and complete ethmoid dissection was accomplished. There is severe polypoid disease present bilaterally. Her some thick inspissated mucus as well. All this was cleaned out. The lamina papyracea was kept intact bilaterally. The fovea was kept intact superiorly. The turbinate remnant was trimmed back partially on the left to avoid postoperative obstruction. Maxillary antrostomies were open and clear bilaterally. There was significant polypoid disease in the left nasal cavity arising from the superior meatus which was also removed using the microdebrider.  2. Bilateral endoscopic frontal sinusotomy. After the ethmoid dissection was completed, the frontal recess was inspected with a 30 endoscope and a curved suction. A curved debrider blade was used to clean  out some of the disease. There is severe obstruction bilaterally. Both frontal sinuses were opened nicely. There was excellent transillumination on the left, unable to completely transilluminate the right side. There is a ball of fungal debris present in the left frontal sinus that was suctioned out. Both ethmoid cavities and the left frontal recess were packed with NasalPore coated with Cortisporin suspension.  The pharynx was suctioned of blood and secretions and the patient was awakened extubated transferred to recovery in stable condition.    PATIENT DISPOSITION:  To PACU, stable

## 2015-02-02 ENCOUNTER — Encounter (HOSPITAL_BASED_OUTPATIENT_CLINIC_OR_DEPARTMENT_OTHER): Payer: Self-pay | Admitting: Otolaryngology

## 2015-03-04 DIAGNOSIS — J019 Acute sinusitis, unspecified: Secondary | ICD-10-CM | POA: Diagnosis not present

## 2015-05-11 ENCOUNTER — Telehealth: Payer: Self-pay | Admitting: Family Medicine

## 2015-05-11 DIAGNOSIS — Z79899 Other long term (current) drug therapy: Secondary | ICD-10-CM

## 2015-05-11 DIAGNOSIS — Z13220 Encounter for screening for lipoid disorders: Secondary | ICD-10-CM

## 2015-05-11 DIAGNOSIS — Z131 Encounter for screening for diabetes mellitus: Secondary | ICD-10-CM

## 2015-05-11 DIAGNOSIS — Z125 Encounter for screening for malignant neoplasm of prostate: Secondary | ICD-10-CM

## 2015-05-11 NOTE — Telephone Encounter (Signed)
No screening labs are listed in EPIC

## 2015-05-11 NOTE — Telephone Encounter (Signed)
Lipid, liver, metabolic 7, PSA 

## 2015-05-11 NOTE — Telephone Encounter (Signed)
Patient has appointment for physical 4/4 and would like lab papers.

## 2015-05-12 NOTE — Telephone Encounter (Signed)
Blood work ordered in EPIC. Patient notified. 

## 2015-05-14 ENCOUNTER — Encounter: Payer: Self-pay | Admitting: Family Medicine

## 2015-05-14 DIAGNOSIS — Z13220 Encounter for screening for lipoid disorders: Secondary | ICD-10-CM | POA: Diagnosis not present

## 2015-05-14 DIAGNOSIS — Z79899 Other long term (current) drug therapy: Secondary | ICD-10-CM | POA: Diagnosis not present

## 2015-05-14 DIAGNOSIS — Z125 Encounter for screening for malignant neoplasm of prostate: Secondary | ICD-10-CM | POA: Diagnosis not present

## 2015-05-14 DIAGNOSIS — Z131 Encounter for screening for diabetes mellitus: Secondary | ICD-10-CM | POA: Diagnosis not present

## 2015-05-15 LAB — LIPID PANEL
CHOLESTEROL TOTAL: 175 mg/dL (ref 100–199)
Chol/HDL Ratio: 4.2 ratio units (ref 0.0–5.0)
HDL: 42 mg/dL (ref >39)
LDL Calculated: 100 mg/dL — ABNORMAL HIGH (ref 0–99)
TRIGLYCERIDES: 163 mg/dL — AB (ref 0–149)
VLDL Cholesterol Cal: 33 mg/dL (ref 5–40)

## 2015-05-15 LAB — BASIC METABOLIC PANEL
BUN / CREAT RATIO: 12 (ref 9–20)
BUN: 14 mg/dL (ref 6–24)
CHLORIDE: 104 mmol/L (ref 96–106)
CO2: 24 mmol/L (ref 18–29)
CREATININE: 1.17 mg/dL (ref 0.76–1.27)
Calcium: 9.4 mg/dL (ref 8.7–10.2)
GFR calc Af Amer: 86 mL/min/{1.73_m2} (ref >59)
GFR calc non Af Amer: 74 mL/min/{1.73_m2} (ref >59)
GLUCOSE: 93 mg/dL (ref 65–99)
POTASSIUM: 4.8 mmol/L (ref 3.5–5.2)
SODIUM: 143 mmol/L (ref 134–144)

## 2015-05-15 LAB — HEPATIC FUNCTION PANEL
ALK PHOS: 60 IU/L (ref 39–117)
ALT: 16 IU/L (ref 0–44)
AST: 16 IU/L (ref 0–40)
Albumin: 4.2 g/dL (ref 3.5–5.5)
BILIRUBIN, DIRECT: 0.14 mg/dL (ref 0.00–0.40)
Bilirubin Total: 0.5 mg/dL (ref 0.0–1.2)
Total Protein: 6.5 g/dL (ref 6.0–8.5)

## 2015-05-15 LAB — PSA: PROSTATE SPECIFIC AG, SERUM: 1.3 ng/mL (ref 0.0–4.0)

## 2015-06-02 ENCOUNTER — Ambulatory Visit (INDEPENDENT_AMBULATORY_CARE_PROVIDER_SITE_OTHER): Payer: 59 | Admitting: Family Medicine

## 2015-06-02 ENCOUNTER — Encounter: Payer: Self-pay | Admitting: Family Medicine

## 2015-06-02 VITALS — BP 132/86 | Ht 75.0 in | Wt 272.6 lb

## 2015-06-02 DIAGNOSIS — Z0000 Encounter for general adult medical examination without abnormal findings: Secondary | ICD-10-CM

## 2015-06-02 DIAGNOSIS — R030 Elevated blood-pressure reading, without diagnosis of hypertension: Secondary | ICD-10-CM | POA: Diagnosis not present

## 2015-06-02 DIAGNOSIS — IMO0001 Reserved for inherently not codable concepts without codable children: Secondary | ICD-10-CM

## 2015-06-02 DIAGNOSIS — Z0189 Encounter for other specified special examinations: Secondary | ICD-10-CM | POA: Diagnosis not present

## 2015-06-02 NOTE — Progress Notes (Signed)
Subjective:    Patient ID: Brady [name], male    DOB: 08-11-1970, 45 y.o.   MRN: [id number]  HPI  The patient comes in today for a wellness visit.    A review of their health history was completed.  A review of medications was also completed.  Any needed refills; no  Eating habits: could be better  Falls/  MVA accidents in past few months: no  Regular exercise: yes 2-3 days a week  Specialist pt sees on regular basis: no  Preventative health issues were discussed.   Additional concerns: bp been running high for month Results for orders placed or performed in visit on 05/11/15  Lipid panel  Result Value Ref Range   Cholesterol, Total 175 100 - 199 mg/dL   Triglycerides 163 (H) 0 - 149 mg/dL   HDL 42 >39 mg/dL   VLDL Cholesterol Cal 33 5 - 40 mg/dL   LDL Calculated 100 (H) 0 - 99 mg/dL   Chol/HDL Ratio 4.2 0.0 - 5.0 ratio units  Hepatic function panel  Result Value Ref Range   Total Protein 6.5 6.0 - 8.5 g/dL   Albumin 4.2 3.5 - 5.5 g/dL   Bilirubin Total 0.5 0.0 - 1.2 mg/dL   Bilirubin, Direct 0.14 0.00 - 0.40 mg/dL   Alkaline Phosphatase 60 39 - 117 IU/L   AST 16 0 - 40 IU/L   ALT 16 0 - 44 IU/L  Basic metabolic panel  Result Value Ref Range   Glucose 93 65 - 99 mg/dL   BUN 14 6 - 24 mg/dL   Creatinine, Ser 1.17 0.76 - 1.27 mg/dL   GFR calc non Af Amer 74 >59 mL/min/1.73   GFR calc Af Amer 86 >59 mL/min/1.73   BUN/Creatinine Ratio 12 9 - 20   Sodium 143 134 - 144 mmol/L   Potassium 4.8 3.5 - 5.2 mmol/L   Chloride 104 96 - 106 mmol/L   CO2 24 18 - 29 mmol/L   Calcium 9.4 8.7 - 10.2 mg/dL  PSA  Result Value Ref Range   Prostate Specific Ag, Serum 1.3 0.0 - 4.0 ng/mL   Exercise good walking reg, there a couple hrs, walks the dogs reg  BP mostly elevated. Strong family history of hypertension. Patient brings in multiple numbers recorded in both arms. Many the systolics are in the 0000000. Many of the diastolic in the upper [id number] or low 90s. Fair  correlation arm to arm though some higher numbers with left arm reviewed     Review of Systems  Constitutional: Negative for fever, activity change and appetite change.  HENT: Negative for congestion and rhinorrhea.   Eyes: Negative for discharge.  Respiratory: Negative for cough and wheezing.   Cardiovascular: Negative for chest pain.  Gastrointestinal: Negative for vomiting, abdominal pain and blood in stool.  Genitourinary: Negative for frequency and difficulty urinating.  Musculoskeletal: Negative for neck pain.  Skin: Negative for rash.  Allergic/Immunologic: Negative for environmental allergies and food allergies.  Neurological: Negative for weakness and headaches.  Psychiatric/Behavioral: Negative for agitation.  All other systems reviewed and are negative.      Objective:   Physical Exam  Constitutional: He appears well-developed and well-nourished.  HENT:  Head: Normocephalic and atraumatic.  Right Ear: External ear normal.  Left Ear: External ear normal.  Nose: Nose normal.  Mouth/Throat: Oropharynx is clear and moist.  Eyes: EOM are normal. Pupils are equal, round, and reactive to light.  Neck: Normal range of motion. Neck  supple. No thyromegaly present.  Cardiovascular: Normal rate, regular rhythm and normal heart sounds.   No murmur heard. Pulmonary/Chest: Effort normal and breath sounds normal. No respiratory distress. He has no wheezes.  Abdominal: Soft. Bowel sounds are normal. He exhibits no distension and no mass. There is no tenderness.  Genitourinary: Penis normal.  Musculoskeletal: Normal range of motion. He exhibits no edema.  Lymphadenopathy:    He has no cervical adenopathy.  Neurological: He is alert. He exhibits normal muscle tone.  Skin: Skin is warm and dry. No erythema.  Psychiatric: He has a normal mood and affect. His behavior is normal. Judgment normal.  Vitals reviewed.  Prostate exam normal       Assessment & Plan:  Impression 1  well adult exam. Positive family history of prostate cancer. PSA negative prostate exam good#2 elevated blood pressure discussed at length not high enough for meds yet. Educational information given multiple studies discussed. We stopped time on this plan check blood pressures keep track. Diet exercise discussed. Up-to-date on screening blood work

## 2016-04-25 ENCOUNTER — Encounter: Payer: Self-pay | Admitting: Orthopedic Surgery

## 2016-04-25 ENCOUNTER — Ambulatory Visit (INDEPENDENT_AMBULATORY_CARE_PROVIDER_SITE_OTHER): Payer: 59

## 2016-04-25 ENCOUNTER — Ambulatory Visit (INDEPENDENT_AMBULATORY_CARE_PROVIDER_SITE_OTHER): Payer: 59 | Admitting: Orthopedic Surgery

## 2016-04-25 VITALS — BP 120/77 | HR 72 | Ht 75.0 in | Wt 268.0 lb

## 2016-04-25 DIAGNOSIS — M25562 Pain in left knee: Secondary | ICD-10-CM

## 2016-04-25 DIAGNOSIS — M1712 Unilateral primary osteoarthritis, left knee: Secondary | ICD-10-CM | POA: Diagnosis not present

## 2016-04-25 NOTE — Progress Notes (Signed)
Patient ID: Brady [name], male   DOB: Mar 13, 1970, 46 y.o.   MRN: [id number]  Chief Complaint  Patient presents with  . Knee Pain    LEFT KNEE PAIN    HPI Brady [name] is a 46 y.o. male. HPI 46 years old presents for evaluation of left knee  Patient been having moderately severe medial left knee pain for a couple months no trauma. Occasional swelling. Occasional hyperextension of the knee when he is walking   Review of Systems Review of Systems  Constitutional: Negative for fatigue and fever.  Musculoskeletal: Positive for arthralgias, back pain, gait problem and joint swelling.  Neurological: Positive for numbness. Negative for weakness.   (2 MINIMUM)  Past Medical History:  Diagnosis Date  . Allergic rhinitis   . Anxiety   . Chronic pansinusitis   . GERD (gastroesophageal reflux disease)   . IBS (irritable bowel syndrome)   . Mitral prolapse   . Reflux     Past Surgical History:  Procedure Laterality Date  . CARPAL TUNNEL RELEASE    . COLONOSCOPY    . COLONOSCOPY N/A 10/13/2014   Procedure: COLONOSCOPY;  Surgeon: Danie Binder, MD;  Location: [facility];  Service: Endoscopy;  Laterality: N/A;  0830   . FRONTAL SINUS EXPLORATION Bilateral 01/30/2015   Procedure: FRONTAL SINUS EXPLORATION;  Surgeon: Izora Gala, MD;  Location: [facility];  Service: ENT;  Laterality: Bilateral;  . LATERAL EPICONDYLE RELEASE  07/08/2011   Procedure: TENNIS ELBOW RELEASE;  Surgeon: Carole Civil, MD;  Location: [facility];  Service: Orthopedics;  Laterality: Left;  Left Tennis Elbow Release  . NASAL SINUS SURGERY Bilateral 01/30/2015   Procedure:  BILATERAL ENDOSCOPIC MAXILLARY, ETHMOID AND FRONTAL SINUS SURGERY;  Surgeon: Izora Gala, MD;  Location: [facility];  Service: ENT;  Laterality: Bilateral;  . TENNIS ELBOW RELEASE/NIRSCHEL PROCEDURE Right 08/08/2014   Procedure: RIGHT TENNIS ELBOW RELEASE;  Surgeon: Carole Civil, MD;  Location: [facility];  Service: Orthopedics;  Laterality: Right;  . TONSILLECTOMY    . WISDOM TOOTH EXTRACTION      Social History Social History  Substance Use Topics  . Smoking status: Never Smoker  . Smokeless tobacco: Never Used     Comment: Never smoked  . Alcohol use 0.0 oz/week     Comment: one beer a month    Allergies  Allergen Reactions  . Penicillins Other (See Comments)    Patient states he was deathly ill    Current Meds  Medication Sig  . cholecalciferol (VITAMIN D) 1000 UNITS tablet Take 1,000 Units by mouth daily.  Marland Kitchen ECHINACEA PO Take 1 tablet by mouth daily.  . fexofenadine (ALLEGRA) 180 MG tablet Take 180 mg by mouth daily.  . Flaxseed, Linseed, (FLAX SEED OIL PO) Take 1 capsule by mouth daily.  . fluticasone (FLONASE) 50 MCG/ACT nasal spray PLACE 1 SPRAY INTO THE NOSE DAILY.  . Ginger, Zingiber officinalis, (GINGER PO) Take 1 capsule by mouth daily.  . Glucosamine HCl (GLUCOSAMINE PO) Take 2 tablets by mouth daily.   Marland Kitchen KRILL OIL PO Take 1 capsule by mouth daily.  . Multiple Vitamin (MULTIVITAMIN WITH MINERALS) TABS tablet Take 1 tablet by mouth daily.  Marland Kitchen POTASSIUM PO Take 1 tablet by mouth daily.   . TURMERIC PO Take 1 tablet by mouth daily.  . vitamin C (ASCORBIC ACID) 500 MG tablet Take 500 mg by mouth daily.      Physical Exam Physical Exam 1.BP 120/77  Pulse 72   Ht 6\' 3"  (1.905 m)   Wt 268 lb (121.6 kg)   BMI 33.50 kg/m   2. Gen. appearance. The patient is well-developed and well-nourished, grooming and hygiene are normal. There are no gross congenital abnormalities  3. The patient is alert and oriented to person place and time  4. Mood and affect are normal  5. Ambulation No abnormalities on gait analysis   Examination reveals the following:  He has tenderness over the medial joint line no effusion. He has no restrictions in range of motion which is full. Stability tests are normal for the anterior cruciate ligament PCL medial and lateral collateral  ligaments at 0 and 30  Head normal muscle tone.  10. Skin we find no rash ulceration or erythema  11. Sensation remains intact  12 Vascular system shows no peripheral edema    MEDICAL DECISION MAKING:    Data Reviewed XRAYS I ordered an x-ray and it showed arthritis of the knee  Assessment Encounter Diagnoses  Name Primary?  . Left knee pain, unspecified chronicity Yes  . Arthritis of left knee      Plan Procedure note left knee injection verbal consent was obtained to inject left knee joint  Timeout was completed to confirm the site of injection  The medications used were 40 mg of Depo-Medrol and 1% lidocaine 3 cc  Anesthesia was provided by ethyl chloride and the skin was prepped with alcohol.  After cleaning the skin with alcohol a 20-gauge needle was used to inject the left knee joint. There were no complications. A sterile bandage was applied.  ADVIL PRN   Brady [name] 04/25/2016, 4:12 PM

## 2016-04-25 NOTE — Patient Instructions (Addendum)
ADVIL AS NEEDED  SCHEDULE APPT FOR THE ANKLE   You have received an injection of steroids into the joint. 15% of patients will have increased pain within the 24 hours postinjection.   This is transient and will go away.   We recommend that you use ice packs on the injection site for 20 minutes every 2 hours and extra strength Tylenol 2 tablets every 8 as needed until the pain resolves.  If you continue to have pain after taking the Tylenol and using the ice please call the office for further instructions.  Arthritis Introduction Arthritis means joint pain. It can also mean joint disease. A joint is a place where bones come together. People who have arthritis may have:  Red joints.  Swollen joints.  Stiff joints.  Warm joints.  A fever.  A feeling of being sick. Follow these instructions at home: Pay attention to any changes in your symptoms. Take these actions to help with your pain and swelling. Medicines  Take over-the-counter and prescription medicines only as told by your doctor.  Do not take aspirin for pain if your doctor says that you may have gout. Activity  Rest your joint if your doctor tells you to.  Avoid activities that make the pain worse.  Exercise your joint regularly as told by your doctor. Try doing exercises like:  Swimming.  Water aerobics.  Biking.  Walking. Joint Care   If your joint is swollen, keep it raised (elevated) if told by your doctor.  If your joint feels stiff in the morning, try taking a warm shower.  If you have diabetes, do not apply heat without asking your doctor.  If told, apply heat to the joint:  Put a towel between the joint and the hot pack or heating pad.  Leave the heat on the area for 20-30 minutes.  If told, apply ice to the joint:  Put ice in a plastic bag.  Place a towel between your skin and the bag.  Leave the ice on for 20 minutes, 2-3 times per day.  Keep all follow-up visits as told by your  doctor. Contact a doctor if:  The pain gets worse.  You have a fever. Get help right away if:  You have very bad pain in your joint.  You have swelling in your joint.  Your joint is red.  Many joints become painful and swollen.  You have very bad back pain.  Your leg is very weak.  You cannot control your pee (urine) or poop (stool). This information is not intended to replace advice given to you by your health care provider. Make sure you discuss any questions you have with your health care provider. Document Released: 05/11/2009 Document Revised: 07/23/2015 Document Reviewed: 05/12/2014  2017 Elsevier

## 2016-05-02 ENCOUNTER — Encounter: Payer: Self-pay | Admitting: Orthopedic Surgery

## 2016-05-23 ENCOUNTER — Ambulatory Visit (INDEPENDENT_AMBULATORY_CARE_PROVIDER_SITE_OTHER): Payer: 59 | Admitting: Orthopedic Surgery

## 2016-05-23 ENCOUNTER — Ambulatory Visit (INDEPENDENT_AMBULATORY_CARE_PROVIDER_SITE_OTHER): Payer: 59

## 2016-05-23 DIAGNOSIS — M25372 Other instability, left ankle: Secondary | ICD-10-CM | POA: Diagnosis not present

## 2016-05-23 DIAGNOSIS — M25572 Pain in left ankle and joints of left foot: Secondary | ICD-10-CM

## 2016-05-23 NOTE — Progress Notes (Signed)
Patient ID: Brady [name], male   DOB: 17-Dec-1970, 46 y.o.   MRN: [id number]  CC: Left ankle pain  HPI Brady [name] is a 46 y.o. male.  Brady [name] presents with a history of 20 years of ankle instability. He injured his ankle in the 90s had a severe inversion injury and was treated with Aircast and contrast baths. Over the last 20 years he's had increased pain on the lateral side of the ankle which is exacerbated by walking on unlevel ground and is associated with a deep dull aching and occasional sharp pain with popping catching and giving WAY  Review of Systems Review of Systems  Constitutional: Negative for fever.  Respiratory: Negative for shortness of breath.   Cardiovascular: Negative for chest pain.  Skin: Negative for rash.   (2 MINIMUM)  Past Medical History:  Diagnosis Date  . Allergic rhinitis   . Anxiety   . Chronic pansinusitis   . GERD (gastroesophageal reflux disease)   . IBS (irritable bowel syndrome)   . Mitral prolapse   . Reflux     Past Surgical History:  Procedure Laterality Date  . CARPAL TUNNEL RELEASE    . COLONOSCOPY    . COLONOSCOPY N/A 10/13/2014   Procedure: COLONOSCOPY;  Surgeon: Danie Binder, MD;  [facility];  Service: Endoscopy;  Laterality: N/A;  0830   . FRONTAL SINUS EXPLORATION Bilateral 01/30/2015   Procedure: FRONTAL SINUS EXPLORATION;  Surgeon: Izora Gala, MD;  Location: [place];  Service: ENT;  Laterality: Bilateral;  . LATERAL EPICONDYLE RELEASE  07/08/2011   Procedure: TENNIS ELBOW RELEASE;  Surgeon: Carole Civil, MD;  [facility];  Service: Orthopedics;  Laterality: Left;  Left Tennis Elbow Release  . NASAL SINUS SURGERY Bilateral 01/30/2015   Procedure:  BILATERAL ENDOSCOPIC MAXILLARY, ETHMOID AND FRONTAL SINUS SURGERY;  Surgeon: Izora Gala, MD;  Location: [place]  Service: ENT;  Laterality: Bilateral;  . TENNIS ELBOW RELEASE/NIRSCHEL PROCEDURE Right 08/08/2014   Procedure: RIGHT TENNIS ELBOW RELEASE;  Surgeon: Carole Civil, MD;  [facility];  Service: Orthopedics;  Laterality: Right;  . TONSILLECTOMY    . WISDOM TOOTH EXTRACTION      Social History Social History  Substance Use Topics  . Smoking status: Never Smoker  . Smokeless tobacco: Never Used     Comment: Never smoked  . Alcohol use 0.0 oz/week     Comment: one beer a month    Allergies  Allergen Reactions  . Penicillins Other (See Comments)    Patient states he was deathly ill    No outpatient prescriptions have been marked as taking for the 05/23/16 encounter (Office Visit) with Carole Civil, MD.      Physical Exam Physical Exam 1.There were no vitals taken for this visit.  2. Gen. appearance. The patient is well-developed and well-nourished, grooming and hygiene are normal. There are no gross congenital abnormalities  3. The patient is alert and oriented to person place and time  4. Mood and affect are normal  5. Ambulation Today we find no problems with his ambulation  Examination reveals the following: 6. On inspection we find stable right ankle to the drawer test but he has severe or excessive inversion of both ankles  He has normal range of motion of both ankles  His anterior drawer tests showed increased laxity on the left compared to the right with no in point on the left and a large sulcus sign  9. Strength tests revealed grade 5 motor strength  10. Skin we find no rash ulceration or erythema  11. Sensation remains intact  12 Vascular system shows no peripheral edema    MEDICAL DECISION MAKING:    Data Reviewed Ankle x-ray was normal  Assessment Left ankle instability  Plan ASO bracing in 4 weeks of therapy come back in 6 weeks if no improvement he may be headed for an ankle ligament Brostrom type repair  Arther Abbott 05/23/2016, 4:22 PM

## 2016-06-06 ENCOUNTER — Ambulatory Visit: Payer: 59 | Attending: Orthopedic Surgery

## 2016-06-06 DIAGNOSIS — M25372 Other instability, left ankle: Secondary | ICD-10-CM | POA: Diagnosis not present

## 2016-06-06 DIAGNOSIS — M25572 Pain in left ankle and joints of left foot: Secondary | ICD-10-CM | POA: Diagnosis not present

## 2016-06-06 DIAGNOSIS — M6281 Muscle weakness (generalized): Secondary | ICD-10-CM | POA: Insufficient documentation

## 2016-06-06 DIAGNOSIS — R262 Difficulty in walking, not elsewhere classified: Secondary | ICD-10-CM | POA: Insufficient documentation

## 2016-06-06 NOTE — Patient Instructions (Addendum)
IIssued  From cabinet T-Band  Green for LT ankle strengthening: 10-20 reps  1x/day cued for deliberate /slow movements.     Home Modalities: Contrast Bath   -Prepare two containers large enough for right foot. Fill one with warm water at 105-110 F.  Fill other with cold water at 55-65 F. -Soak in warm water for 3 minutes. -Then soak in cold water for 1 minutes. Repeat for 20 minutes, ending in warm water. Do ___ times per day   Copyright  VHI. All rights reserved.

## 2016-06-06 NOTE — Therapy (Signed)
[place], [place], [address] Phone: [phone]   Fax:  [phone]  Physical Therapy Evaluation  Patient Details  Name: Brady [name] MRN: [id number] Date of Birth: 21-Apr-1970 Referring Provider: Arther Abbott, MD  Encounter Date: 06/06/2016      PT End of Session - 06/06/16 1651    Visit Number 1   Number of Visits 8   Date for PT Re-Evaluation 07/01/16   Authorization Type UMR   PT Start Time 0345   PT Stop Time 0425   PT Time Calculation (min) 40 min   Activity Tolerance Patient tolerated treatment well;No increased pain   Behavior During Therapy WFL for tasks assessed/performed      Past Medical History:  Diagnosis Date  . Allergic rhinitis   . Anxiety   . Chronic pansinusitis   . GERD (gastroesophageal reflux disease)   . IBS (irritable bowel syndrome)   . Mitral prolapse   . Reflux     Past Surgical History:  Procedure Laterality Date  . CARPAL TUNNEL RELEASE    . COLONOSCOPY    . COLONOSCOPY N/A 10/13/2014   Procedure: COLONOSCOPY;  Surgeon: Danie Binder, MD;  [facility];  Service: Endoscopy;  Laterality: N/A;  0830   . FRONTAL SINUS EXPLORATION Bilateral 01/30/2015   Procedure: FRONTAL SINUS EXPLORATION;  Surgeon: Izora Gala, MD;  Location: [place];  Service: ENT;  Laterality: Bilateral;  . LATERAL EPICONDYLE RELEASE  07/08/2011   Procedure: TENNIS ELBOW RELEASE;  Surgeon: Carole Civil, MD;  [facility];  Service: Orthopedics;  Laterality: Left;  Left Tennis Elbow Release  . NASAL SINUS SURGERY Bilateral 01/30/2015   Procedure:  BILATERAL ENDOSCOPIC MAXILLARY, ETHMOID AND FRONTAL SINUS SURGERY;  Surgeon: Izora Gala, MD;  Location: [place];  Service: ENT;  Laterality: Bilateral;  . TENNIS ELBOW RELEASE/NIRSCHEL PROCEDURE Right 08/08/2014   Procedure: RIGHT TENNIS ELBOW RELEASE;  Surgeon: Carole Civil, MD;   [facility];  Service: Orthopedics;  Laterality: Right;  . TONSILLECTOMY    . WISDOM TOOTH EXTRACTION      There were no vitals filed for this visit.       Subjective Assessment - 06/06/16 1551    Subjective He reports when young sprained ankle many times and had one bad sprain on LT. Ankle did well for a long time . in past few years pain started and began to increase and ankle was unstable.        Limitations Walking  yard work , uneven terrain   How long can you sit comfortably? As needed   How long can you stand comfortably? 60 min with shifting weight    How long can you walk comfortably? AS neede but backed off longer walks such as walking dogs . Now he can walk a mile   Diagnostic tests xray    Patient Stated Goals He want to have less pain with walking (3 miles a day). Not be made fun of on standing from chair and hobbling   Currently in Pain? Yes   Pain Score 4    Pain Location Ankle   Pain Orientation Left   Pain Descriptors / Indicators Aching   Pain Type Chronic pain   Pain Onset More than a month ago   Pain Frequency Constant   Aggravating Factors  weight bearing   Pain Relieving Factors rest, meds ,    Multiple Pain Sites No  [facility] PT Assessment - 06/06/16 0001      Assessment   Medical Diagnosis instability of left ankle   Referring Provider Arther Abbott, MD   Onset Date/Surgical Date --  6 months ago   Next MD Visit MAy 10th   Prior Therapy No     Precautions   Precautions None     Restrictions   Weight Bearing Restrictions No     Balance Screen   Has the patient fallen in the past 6 months No   Has the patient had a decrease in activity level because of a fear of falling?  No   Is the patient reluctant to leave their home because of a fear of falling?  No     Prior Function   Level of Independence Independent     Functional Tests   Functional tests Squat     Squat   Comments Increased weight to Rt leg.       Posture/Postural Control   Posture Comments pes planus bilaterally     ROM / Strength   AROM / PROM / Strength AROM;PROM;Strength     AROM   AROM Assessment Site Ankle   Right/Left Ankle Right;Left   Right Ankle Dorsiflexion 109   Right Ankle Plantar Flexion 175   Right Ankle Inversion 35   Right Ankle Eversion 30   Left Ankle Dorsiflexion 105   Left Ankle Plantar Flexion 183   Left Ankle Inversion 33   Left Ankle Eversion 28     PROM   PROM Assessment Site Ankle   Right/Left Ankle Left   Left Ankle Dorsiflexion 108   Left Ankle Plantar Flexion 185   Left Ankle Inversion 53   Left Ankle Eversion 32     Strength   Strength Assessment Site Ankle   Right/Left Ankle Right;Left   Right Ankle Dorsiflexion 5/5   Right Ankle Plantar Flexion 5/5   Right Ankle Inversion 5/5   Right Ankle Eversion 5/5   Left Ankle Dorsiflexion --  5-/5   Left Ankle Plantar Flexion 5/5   Left Ankle Inversion 5/5   Left Ankle Eversion 5/5     Flexibility   Soft Tissue Assessment /Muscle Length yes   Hamstrings 60-65 degrees                            PT Education - 06/06/16 1642    Education provided Yes   Education Details POC , HEP , contrast bath   Person(s) Educated Patient   Methods Explanation;Demonstration;Handout;Verbal cues   Comprehension Returned demonstration;Verbalized understanding             PT Long Term Goals - 06/06/16 1656      PT LONG TERM GOAL #1   Title He will be able to do all HEP issued as of last visit   Time 4   Period Weeks   Status New     PT LONG TERM GOAL #2   Title He will report pain decreased 50% or more ith getting out of chair   Time 4   Period Weeks   Status New     PT LONG TERM GOAL #3   Title He will report able to  walk  1.5 miles with min to no increased pain   Time 4   Period Weeks   Status New               Plan - 06/06/16 1652  Clinical Impression Statement Mr Standley reports for low  complexity eval with LT ankle pain, mild weakness with DF ( heel walk LT foot drops slightly) , pes planus decreased stability with single leg standing.   Rehab Potential Good   PT Frequency 2x / week   PT Duration 4 weeks   PT Treatment/Interventions Cryotherapy;Moist Heat;Balance training;Therapeutic exercise;Patient/family education;Manual techniques;Taping   PT Next Visit Plan Advance strength and balance, tape and modalities as helpful   PT Home Exercise Plan Greeen band exercises and contrast bath   Consulted and Agree with Plan of Care Patient      Patient will benefit from skilled therapeutic intervention in order to improve the following deficits and impairments:  Difficulty walking, Pain, Decreased activity tolerance, Decreased strength, Postural dysfunction  Visit Diagnosis: Instability of ankle joint, left  Pain in left ankle and joints of left foot  Difficulty walking  Muscle weakness (generalized)     Problem List Patient Active Problem List   Diagnosis Date Noted  . Colon cancer screening 08/27/2014  . Lateral epicondylitis (tennis elbow)   . Tennis elbow 03/29/2011  . BACK PAIN, LUMBAR 05/23/2008  . PES PLANUS 05/23/2008    [name]  PT 06/06/2016, 4:59 PM  [facility] [address] [place], [place], [address] Phone: [phone]   Fax:  [phone]  Name: Brady [name] MRN: [id number] Date of Birth: 02-23-1971

## 2016-06-07 ENCOUNTER — Ambulatory Visit: Payer: 59

## 2016-06-07 DIAGNOSIS — M25572 Pain in left ankle and joints of left foot: Secondary | ICD-10-CM | POA: Diagnosis not present

## 2016-06-07 DIAGNOSIS — M6281 Muscle weakness (generalized): Secondary | ICD-10-CM

## 2016-06-07 DIAGNOSIS — M25372 Other instability, left ankle: Secondary | ICD-10-CM

## 2016-06-07 DIAGNOSIS — R262 Difficulty in walking, not elsewhere classified: Secondary | ICD-10-CM

## 2016-06-07 NOTE — Therapy (Signed)
[place], [place], [address] Phone: [phone]   Fax:  [phone]  Physical Therapy Treatment  Patient Details  Name: Brady [name] MRN: [id number] Date of Birth: Apr 16, 1970 Referring Provider: Arther Abbott, MD  Encounter Date: 06/07/2016      PT End of Session - 06/07/16 1158    Visit Number 2   Number of Visits 8   Date for PT Re-Evaluation 07/01/16   Authorization Type UMR   PT Start Time 1146   PT Stop Time 1216   PT Time Calculation (min) 30 min   Activity Tolerance Patient tolerated treatment well   Behavior During Therapy [facility] for tasks assessed/performed      Past Medical History:  Diagnosis Date  . Allergic rhinitis   . Anxiety   . Chronic pansinusitis   . GERD (gastroesophageal reflux disease)   . IBS (irritable bowel syndrome)   . Mitral prolapse   . Reflux     Past Surgical History:  Procedure Laterality Date  . CARPAL TUNNEL RELEASE    . COLONOSCOPY    . COLONOSCOPY N/A 10/13/2014   Procedure: COLONOSCOPY;  Surgeon: Danie Binder, MD;  [facility];  Service: Endoscopy;  Laterality: N/A;  0830   . FRONTAL SINUS EXPLORATION Bilateral 01/30/2015   Procedure: FRONTAL SINUS EXPLORATION;  Surgeon: Izora Gala, MD;  Location: [place];  Service: ENT;  Laterality: Bilateral;  . LATERAL EPICONDYLE RELEASE  07/08/2011   Procedure: TENNIS ELBOW RELEASE;  Surgeon: Carole Civil, MD;  [facility];  Service: Orthopedics;  Laterality: Left;  Left Tennis Elbow Release  . NASAL SINUS SURGERY Bilateral 01/30/2015   Procedure:  BILATERAL ENDOSCOPIC MAXILLARY, ETHMOID AND FRONTAL SINUS SURGERY;  Surgeon: Izora Gala, MD;  Location: [place];  Service: ENT;  Laterality: Bilateral;  . TENNIS ELBOW RELEASE/NIRSCHEL PROCEDURE Right 08/08/2014   Procedure: RIGHT TENNIS ELBOW RELEASE;  Surgeon: Carole Civil, MD;  [facility];   Service: Orthopedics;  Laterality: Right;  . TONSILLECTOMY    . WISDOM TOOTH EXTRACTION      There were no vitals filed for this visit.      Subjective Assessment - 06/07/16 1156    Subjective No changes since last visit. Wearing brace today.  No problem with band   Currently in Pain? Yes   Pain Score 2    Pain Location Ankle   Pain Orientation Left   Pain Descriptors / Indicators Aching                         [facility] Adult PT Treatment/Exercise - 06/07/16 0001      Neuro Re-ed    Neuro Re-ed Details  stand on foam with stepping forward back x 25,  side and cross step.  x 20,       Exercises   Exercises Ankle     Ankle Exercises: Aerobic   Stationary Bike L3 5 min     Ankle Exercises: Machines for Strengthening   Cybex Leg Press 5 plates x 15 bilateral PF then 10 eps LT with incr pain so decr weight to 2 plates  x 5 2 sets. Discussed how to progress in gym  with decreased weight and to break into sets of 5-10 to tolerance.      Ankle Exercises: Standing   BAPS Sitting;Level 3;10 reps   BAPS Limitations clock and counter clockwise 2 sets  Heel Raises 10 reps   Heel Raises Limitations bilat/RT/LT                PT Education - 06/06/16 1642    Education provided Yes   Education Details POC , HEP , contrast bath   Person(s) Educated Patient   Methods Explanation;Demonstration;Handout;Verbal cues   Comprehension Returned demonstration;Verbalized understanding             PT Long Term Goals - 06/06/16 1656      PT LONG TERM GOAL #1   Title He will be able to do all HEP issued as of last visit   Time 4   Period Weeks   Status New     PT LONG TERM GOAL #2   Title He will report pain decreased 50% or more ith getting out of chair   Time 4   Period Weeks   Status New     PT LONG TERM GOAL #3   Title He will report able to  walk  1.5 miles with min to no increased pain   Time 4   Period Weeks   Status New                Plan - 06/07/16 1203    Clinical Impression Statement Some increased pain with weight bearing activity for balance/stability but declined modality at end reporting ankle was not worse. He will only do band exercise (issued blue band also)  until next week and then will start to do weight bearing activity to tolerance on none compliant surface. . Kept time shorter due to incr pain with standing activity. Did not want to overdo.    PT Treatment/Interventions Cryotherapy;Moist Heat;Balance training;Therapeutic exercise;Patient/family education;Manual techniques;Taping   PT Next Visit Plan Advance strength and balance, try tape and modalities as helpful, none compliant surface balance for home balance    PT Home Exercise Plan Greeen band exercises and contrast bath   Consulted and Agree with Plan of Care Patient      Patient will benefit from skilled therapeutic intervention in order to improve the following deficits and impairments:  Difficulty walking, Pain, Decreased activity tolerance, Decreased strength, Postural dysfunction  Visit Diagnosis: Instability of ankle joint, left  Pain in left ankle and joints of left foot  Difficulty walking  Muscle weakness (generalized)     Problem List Patient Active Problem List   Diagnosis Date Noted  . Colon cancer screening 08/27/2014  . Lateral epicondylitis (tennis elbow)   . Tennis elbow 03/29/2011  . BACK PAIN, LUMBAR 05/23/2008  . PES PLANUS 05/23/2008    Brady [name]  PT 06/07/2016, 12:37 PM  [place] [facility] [address] [place], [place], [address] Phone: [phone]   Fax:  [phone]  Name: Brady [name] MRN: [id number] Date of Birth: 11-02-1970

## 2016-06-14 ENCOUNTER — Ambulatory Visit: Payer: 59

## 2016-06-14 DIAGNOSIS — R262 Difficulty in walking, not elsewhere classified: Secondary | ICD-10-CM

## 2016-06-14 DIAGNOSIS — M6281 Muscle weakness (generalized): Secondary | ICD-10-CM | POA: Diagnosis not present

## 2016-06-14 DIAGNOSIS — M25372 Other instability, left ankle: Secondary | ICD-10-CM | POA: Diagnosis not present

## 2016-06-14 DIAGNOSIS — M25572 Pain in left ankle and joints of left foot: Secondary | ICD-10-CM | POA: Diagnosis not present

## 2016-06-14 NOTE — Therapy (Signed)
[place], [place], [address] Phone: [phone]   Fax:  [phone]  Physical Therapy Treatment  Patient Details  Name: Brady [name] MRN: [id number] Date of Birth: May 03, 1970 Referring Provider: Arther Abbott, MD  Encounter Date: 06/14/2016      PT End of Session - 06/14/16 1545    Visit Number 3   Number of Visits 8   Date for PT Re-Evaluation 07/01/16   Authorization Type UMR   PT Start Time 0300   PT Stop Time 0342   PT Time Calculation (min) 42 min   Activity Tolerance Patient tolerated treatment well   Behavior During Therapy [facility] for tasks assessed/performed      Past Medical History:  Diagnosis Date  . Allergic rhinitis   . Anxiety   . Chronic pansinusitis   . GERD (gastroesophageal reflux disease)   . IBS (irritable bowel syndrome)   . Mitral prolapse   . Reflux     Past Surgical History:  Procedure Laterality Date  . CARPAL TUNNEL RELEASE    . COLONOSCOPY    . COLONOSCOPY N/A 10/13/2014   Procedure: COLONOSCOPY;  Surgeon: Danie Binder, MD;  [facility];  Service: Endoscopy;  Laterality: N/A;  0830   . FRONTAL SINUS EXPLORATION Bilateral 01/30/2015   Procedure: FRONTAL SINUS EXPLORATION;  Surgeon: Izora Gala, MD;  Location: [place];  Service: ENT;  Laterality: Bilateral;  . LATERAL EPICONDYLE RELEASE  07/08/2011   Procedure: TENNIS ELBOW RELEASE;  Surgeon: Carole Civil, MD;  [facility];  Service: Orthopedics;  Laterality: Left;  Left Tennis Elbow Release  . NASAL SINUS SURGERY Bilateral 01/30/2015   Procedure:  BILATERAL ENDOSCOPIC MAXILLARY, ETHMOID AND FRONTAL SINUS SURGERY;  Surgeon: Izora Gala, MD;  Location: [place];  Service: ENT;  Laterality: Bilateral;  . TENNIS ELBOW RELEASE/NIRSCHEL PROCEDURE Right 08/08/2014   Procedure: RIGHT TENNIS ELBOW RELEASE;  Surgeon: Carole Civil, MD;  [facility];   Service: Orthopedics;  Laterality: Right;  . TONSILLECTOMY    . WISDOM TOOTH EXTRACTION      There were no vitals filed for this visit.      Subjective Assessment - 06/14/16 1454    Subjective Still with pain.  Have been exercising. Stayed off foot Saturday and felt better   Currently in Pain? Yes   Pain Score 4    Pain Location Ankle   Pain Orientation Left   Pain Descriptors / Indicators Aching   Pain Type Chronic pain   Pain Onset More than a month ago   Pain Frequency Constant   Aggravating Factors  weightbearing   Pain Relieving Factors meds rest   Multiple Pain Sites No                         [facility] Adult PT Treatment/Exercise - 06/14/16 0001      Neuro Re-ed    Neuro Re-ed Details  Stand on tilt board for lateral and DF / PF  bilateral and single leg  x 25 each direction with control toward end ranges . step ups on bosu x 15 with light one arm touch for balance  and stand LT leg with slides forward/back/sideway  x 15 each      Ankle Exercises: Seated   BAPS Standing;Level 4  30 reps clock and counter clockwise   Other Seated Ankle Exercises Black band exercise 4 way x 30 reps  PT Education - 06/14/16 1544    Education provided Yes   Education Details Added black band for  exercise at home   Person(s) Educated Patient   Methods Explanation   Comprehension Verbalized understanding             PT Long Term Goals - 06/14/16 1547      PT LONG TERM GOAL #1   Title He will be able to do all HEP issued as of last visit   Status On-going     PT LONG TERM GOAL #2   Title He will report pain decreased 50% or more ith getting out of chair   Status On-going     PT LONG TERM GOAL #3   Title He will report able to  walk  1.5 miles with min to no increased pain   Status On-going               Plan - 06/14/16 1545    Clinical Impression Statement Mild incr pain from 4/10 to 5/10 and he declined modality.  tolerated  all activity with max pain 6/10 that eased after 1-2 min. Will continue to work on balance  strength. as tolerated without significant incr pain.    PT Treatment/Interventions Cryotherapy;Moist Heat;Balance training;Therapeutic exercise;Patient/family education;Manual techniques;Taping   PT Next Visit Plan Advance strength and balance, try  modalities as helpful, none compliant surface balance for home balance    PT Home Exercise Plan Greeen band exercises and contrast bath, black band  single leg balance on none compliant surface   Consulted and Agree with Plan of Care Patient      Patient will benefit from skilled therapeutic intervention in order to improve the following deficits and impairments:  Difficulty walking, Pain, Decreased activity tolerance, Decreased strength, Postural dysfunction  Visit Diagnosis: Instability of ankle joint, left  Pain in left ankle and joints of left foot  Difficulty walking  Muscle weakness (generalized)     Problem List Patient Active Problem List   Diagnosis Date Noted  . Colon cancer screening 08/27/2014  . Lateral epicondylitis (tennis elbow)   . Tennis elbow 03/29/2011  . BACK PAIN, LUMBAR 05/23/2008  . PES PLANUS 05/23/2008    [name] PT  06/14/2016, 3:48 PM  [facility] [address] [place], [place], [address] Phone: [phone]   Fax:  [phone]  Name: Brady [name] MRN: [id number] Date of Birth: Oct 11, 1970

## 2016-06-16 ENCOUNTER — Ambulatory Visit: Payer: 59

## 2016-06-16 DIAGNOSIS — M6281 Muscle weakness (generalized): Secondary | ICD-10-CM

## 2016-06-16 DIAGNOSIS — M25572 Pain in left ankle and joints of left foot: Secondary | ICD-10-CM | POA: Diagnosis not present

## 2016-06-16 DIAGNOSIS — R262 Difficulty in walking, not elsewhere classified: Secondary | ICD-10-CM | POA: Diagnosis not present

## 2016-06-16 DIAGNOSIS — M25372 Other instability, left ankle: Secondary | ICD-10-CM | POA: Diagnosis not present

## 2016-06-16 NOTE — Therapy (Signed)
[place], [place], [address] Phone: [phone]   Fax:  [phone]  Physical Therapy Treatment  Patient Details  Name: Brady [name] MRN: [id number] Date of Birth: 01-08-1971 Referring Provider: Arther Abbott, MD  Encounter Date: 06/16/2016      PT End of Session - 06/16/16 1334    Visit Number 4   Number of Visits 8   Date for PT Re-Evaluation 07/01/16   Authorization Type UMR   PT Start Time 0134   PT Stop Time 0215   PT Time Calculation (min) 41 min   Activity Tolerance Patient tolerated treatment well   Behavior During Therapy [facility] for tasks assessed/performed      Past Medical History:  Diagnosis Date  . Allergic rhinitis   . Anxiety   . Chronic pansinusitis   . GERD (gastroesophageal reflux disease)   . IBS (irritable bowel syndrome)   . Mitral prolapse   . Reflux     Past Surgical History:  Procedure Laterality Date  . CARPAL TUNNEL RELEASE    . COLONOSCOPY    . COLONOSCOPY N/A 10/13/2014   Procedure: COLONOSCOPY;  Surgeon: Danie Binder, MD;  [facility];  Service: Endoscopy;  Laterality: N/A;  0830   . FRONTAL SINUS EXPLORATION Bilateral 01/30/2015   Procedure: FRONTAL SINUS EXPLORATION;  Surgeon: Izora Gala, MD;  Location: [place];  Service: ENT;  Laterality: Bilateral;  . LATERAL EPICONDYLE RELEASE  07/08/2011   Procedure: TENNIS ELBOW RELEASE;  Surgeon: Carole Civil, MD;  [facility];  Service: Orthopedics;  Laterality: Left;  Left Tennis Elbow Release  . NASAL SINUS SURGERY Bilateral 01/30/2015   Procedure:  BILATERAL ENDOSCOPIC MAXILLARY, ETHMOID AND FRONTAL SINUS SURGERY;  Surgeon: Izora Gala, MD;  Location: [place];  Service: ENT;  Laterality: Bilateral;  . TENNIS ELBOW RELEASE/NIRSCHEL PROCEDURE Right 08/08/2014   Procedure: RIGHT TENNIS ELBOW RELEASE;  Surgeon: Carole Civil, MD;  [facility];   Service: Orthopedics;  Laterality: Right;  . TONSILLECTOMY    . WISDOM TOOTH EXTRACTION      There were no vitals filed for this visit.      Subjective Assessment - 06/16/16 1402    Subjective Good today 2/10   Currently in Pain? Yes   Pain Score 2    Pain Location Ankle   Pain Orientation Left   Pain Descriptors / Indicators Aching   Pain Type Chronic pain   Pain Onset More than a month ago   Pain Frequency Constant   Aggravating Factors  weight     Pain Relieving Factors meds rest                         [facility] Adult PT Treatment/Exercise - 06/16/16 0001      Neuro Re-ed    Neuro Re-ed Details  Baps , bosu balance with cues to elevate medial foot/ arch x 20 step up and holds 5 sec, then blue foam step with RT foot to 20 inch height, then single leg balance LT with and without support under arch 30 reps      Ankle Exercises: Seated   BAPS Level 3;Standing  30 reps clock and counter clockwise, 75% weight bearing.    Other Seated Ankle Exercises Black band exercise 4 way x 30 reps     Ankle Exercises: Aerobic   Stationary Bike L3 5 min  PT Long Term Goals - 06/14/16 1547      PT LONG TERM GOAL #1   Title He will be able to do all HEP issued as of last visit   Status On-going     PT LONG TERM GOAL #2   Title He will report pain decreased 50% or more ith getting out of chair   Status On-going     PT LONG TERM GOAL #3   Title He will report able to  walk  1.5 miles with min to no increased pain   Status On-going               Plan - 06/16/16 1337    Clinical Impression Statement Pain increased to 4/10 post session with declin of modality. His ability of controlling anke alighnment and arch improved by end of session.  He will be out of town next week and we will resume after he returns.   PT Treatment/Interventions Cryotherapy;Moist Heat;Balance training;Therapeutic exercise;Patient/family education;Manual  techniques;Taping   PT Next Visit Plan Advance strength and balance, try  modalities as helpful, none compliant surface balancing   PT Home Exercise Plan Greeen band exercises and contrast bath, black band  single leg balance on none compliant surface   Consulted and Agree with Plan of Care Patient      Patient will benefit from skilled therapeutic intervention in order to improve the following deficits and impairments:  Difficulty walking, Pain, Decreased activity tolerance, Decreased strength, Postural dysfunction  Visit Diagnosis: Instability of ankle joint, left  Pain in left ankle and joints of left foot  Difficulty walking  Muscle weakness (generalized)     Problem List Patient Active Problem List   Diagnosis Date Noted  . Colon cancer screening 08/27/2014  . Lateral epicondylitis (tennis elbow)   . Tennis elbow 03/29/2011  . BACK PAIN, LUMBAR 05/23/2008  . PES PLANUS 05/23/2008    [name] PT 06/16/2016, 2:17 PM  [facility] [address] [place], [place], [address] Phone: [phone]   Fax:  [phone]  Name: YI HAUGAN MRN: [id number] Date of Birth: March 17, 1970

## 2016-06-28 ENCOUNTER — Ambulatory Visit: Payer: 59 | Attending: Orthopedic Surgery

## 2016-06-28 DIAGNOSIS — M25372 Other instability, left ankle: Secondary | ICD-10-CM | POA: Diagnosis not present

## 2016-06-28 DIAGNOSIS — R262 Difficulty in walking, not elsewhere classified: Secondary | ICD-10-CM | POA: Insufficient documentation

## 2016-06-28 DIAGNOSIS — M6281 Muscle weakness (generalized): Secondary | ICD-10-CM | POA: Diagnosis not present

## 2016-06-28 DIAGNOSIS — M25572 Pain in left ankle and joints of left foot: Secondary | ICD-10-CM | POA: Diagnosis not present

## 2016-06-28 NOTE — Therapy (Signed)
[place], [place], [address] Phone: [phone]   Fax:  [phone]  Physical Therapy Treatment  Patient Details  Name: Brady [name] MRN: [id number] Date of Birth: 08/02/1970 Referring Provider: Arther Abbott, MD  Encounter Date: 06/28/2016      PT End of Session - 06/28/16 1458    Visit Number 5   Number of Visits 8   Date for PT Re-Evaluation 07/01/16   Authorization Type UMR   PT Start Time 0300   PT Stop Time 0340   PT Time Calculation (min) 40 min   Activity Tolerance Patient tolerated treatment well   Behavior During Therapy [facility] for tasks assessed/performed      Past Medical History:  Diagnosis Date  . Allergic rhinitis   . Anxiety   . Chronic pansinusitis   . GERD (gastroesophageal reflux disease)   . IBS (irritable bowel syndrome)   . Mitral prolapse   . Reflux     Past Surgical History:  Procedure Laterality Date  . CARPAL TUNNEL RELEASE    . COLONOSCOPY    . COLONOSCOPY N/A 10/13/2014   Procedure: COLONOSCOPY;  Surgeon: Danie Binder, MD;  [facility];  Service: Endoscopy;  Laterality: N/A;  0830   . FRONTAL SINUS EXPLORATION Bilateral 01/30/2015   Procedure: FRONTAL SINUS EXPLORATION;  Surgeon: Izora Gala, MD;  Location: [place];  Service: ENT;  Laterality: Bilateral;  . LATERAL EPICONDYLE RELEASE  07/08/2011   Procedure: TENNIS ELBOW RELEASE;  Surgeon: Carole Civil, MD;  [facility];  Service: Orthopedics;  Laterality: Left;  Left Tennis Elbow Release  . NASAL SINUS SURGERY Bilateral 01/30/2015   Procedure:  BILATERAL ENDOSCOPIC MAXILLARY, ETHMOID AND FRONTAL SINUS SURGERY;  Surgeon: Izora Gala, MD;  Location: [place];  Service: ENT;  Laterality: Bilateral;  . TENNIS ELBOW RELEASE/NIRSCHEL PROCEDURE Right 08/08/2014   Procedure: RIGHT TENNIS ELBOW RELEASE;  Surgeon: Carole Civil, MD;  [facility];   Service: Orthopedics;  Laterality: Right;  . TONSILLECTOMY    . WISDOM TOOTH EXTRACTION      There were no vitals filed for this visit.      Subjective Assessment - 06/28/16 1458    Subjective 1/10 today. Feeling good today. New insoles.    Currently in Pain? Yes   Pain Score 1    Pain Location Ankle   Pain Orientation Left   Pain Descriptors / Indicators Aching   Pain Type Chronic pain   Pain Onset More than a month ago   Pain Frequency Constant   Aggravating Factors  weight bearing activity   Pain Relieving Factors meds , rest , new insoles   Multiple Pain Sites No                         [facility] Adult PT Treatment/Exercise - 06/28/16 0001      Neuro Re-ed    Neuro Re-ed Details  Baps , bosu balance with cues to elevate medial foot/ arch to wait to shift weight to none stance leg Lt foot to blue foam on floor x 20 step up and holds 5 sec, then blue foam step with RT foot to 20 inch height, then single leg balance LT with  with step over 8 inch step with cues to keep weight on stance leg before shift to none stance leg and then balance single leg on 1/47fam roller to mimic use of  2x4 at home to stance cross way and in tandem with and without leg movements     Ankle Exercises: Aerobic   Stationary Bike L3 5 min     Ankle Exercises: Machines for Strengthening   Cybex Leg Press 7 plates x 25 bilateral PF then 20 reps 2 plates  .      Ankle Exercises: Standing   BAPS Level 4   BAPS Limitations clock and counter clockwise 2 sets  30 reps     Ankle Exercises: Seated   Other Seated Ankle Exercises --                PT Education - 06/28/16 1545    Education provided Yes   Education Details balance on 2x4 for home and modifications to make nmore difficult   Person(s) Educated Patient   Methods Explanation   Comprehension Verbalized understanding             PT Long Term Goals - 06/28/16 1546      PT LONG TERM GOAL #1   Title He will be able  to do all HEP issued as of last visit   Baseline progressing   Status Partially Met     PT LONG TERM GOAL #2   Title He will report pain decreased 50% or more ith getting out of chair   Status Partially Met     PT LONG TERM GOAL #3   Title He will report able to  walk  1.5 miles with min to no increased pain   Status On-going               Plan - 06/28/16 1458    Clinical Impression Statement Doing well with balalnce and pain incr only to 2/10 today. Stronger or new suppport may help stabilize ankel more. Continue x 1-3 . May discharge next visit.    PT Treatment/Interventions Cryotherapy;Moist Heat;Balance training;Therapeutic exercise;Patient/family education;Manual techniques;Taping   PT Next Visit Plan Advance strength and balance, try  modalities as helpful, none compliant surface balancing   PT Home Exercise Plan Greeen band exercises and contrast bath, black band  single leg balance on none compliant surface   Consulted and Agree with Plan of Care Patient      Patient will benefit from skilled therapeutic intervention in order to improve the following deficits and impairments:  Difficulty walking, Pain, Decreased activity tolerance, Decreased strength, Postural dysfunction  Visit Diagnosis: Instability of ankle joint, left  Pain in left ankle and joints of left foot  Muscle weakness (generalized)  Difficulty walking     Problem List Patient Active Problem List   Diagnosis Date Noted  . Colon cancer screening 08/27/2014  . Lateral epicondylitis (tennis elbow)   . Tennis elbow 03/29/2011  . BACK PAIN, LUMBAR 05/23/2008  . PES PLANUS 05/23/2008    [name]  PT 06/28/2016, 3:47 PM  [place] [address] [place], [place], [address] Phone: [phone]   Fax:  [phone]  Name: Brady [name] MRN: [id number] Date of Birth: 05/05/1970

## 2016-06-30 ENCOUNTER — Encounter: Payer: Self-pay | Admitting: Family Medicine

## 2016-06-30 ENCOUNTER — Ambulatory Visit: Payer: 59

## 2016-06-30 DIAGNOSIS — M6281 Muscle weakness (generalized): Secondary | ICD-10-CM

## 2016-06-30 DIAGNOSIS — M25572 Pain in left ankle and joints of left foot: Secondary | ICD-10-CM

## 2016-06-30 DIAGNOSIS — R262 Difficulty in walking, not elsewhere classified: Secondary | ICD-10-CM

## 2016-06-30 DIAGNOSIS — M25372 Other instability, left ankle: Secondary | ICD-10-CM

## 2016-06-30 NOTE — Patient Instructions (Signed)
Issued written instructions for posterior foot slide/ tandem balance on narrow rounded board and on soft ball  1-3 min daily each.

## 2016-06-30 NOTE — Therapy (Addendum)
[place], [place], [address] Phone: [phone]   Fax:  [phone]  Physical Therapy Treatment/Discharge  Patient Details  Name: Brady [name] MRN: [id number] Date of Birth: 1970/08/04 Referring Provider: Arther Abbott, MD  Encounter Date: 06/30/2016      PT End of Session - 06/30/16 1538    Visit Number 6   Number of Visits 8   Date for PT Re-Evaluation 07/01/16   Authorization Type UMR   PT Start Time 0240   PT Stop Time 0330   PT Time Calculation (min) 50 min   Activity Tolerance Patient tolerated treatment well;No increased pain   Behavior During Therapy [facility] for tasks assessed/performed      Past Medical History:  Diagnosis Date  . Allergic rhinitis   . Anxiety   . Chronic pansinusitis   . GERD (gastroesophageal reflux disease)   . IBS (irritable bowel syndrome)   . Mitral prolapse   . Reflux     Past Surgical History:  Procedure Laterality Date  . CARPAL TUNNEL RELEASE    . COLONOSCOPY    . COLONOSCOPY N/A 10/13/2014   Procedure: COLONOSCOPY;  Surgeon: Danie Binder, MD;  [facility];  Service: Endoscopy;  Laterality: N/A;  0830   . FRONTAL SINUS EXPLORATION Bilateral 01/30/2015   Procedure: FRONTAL SINUS EXPLORATION;  Surgeon: Izora Gala, MD;  Location: [place];  Service: ENT;  Laterality: Bilateral;  . LATERAL EPICONDYLE RELEASE  07/08/2011   Procedure: TENNIS ELBOW RELEASE;  Surgeon: Carole Civil, MD;  [facility];  Service: Orthopedics;  Laterality: Left;  Left Tennis Elbow Release  . NASAL SINUS SURGERY Bilateral 01/30/2015   Procedure:  BILATERAL ENDOSCOPIC MAXILLARY, ETHMOID AND FRONTAL SINUS SURGERY;  Surgeon: Izora Gala, MD;  Location: [place];  Service: ENT;  Laterality: Bilateral;  . TENNIS ELBOW RELEASE/NIRSCHEL PROCEDURE Right 08/08/2014   Procedure: RIGHT TENNIS ELBOW RELEASE;  Surgeon: Carole Civil,  MD;  [facility];  Service: Orthopedics;  Laterality: Right;  . TONSILLECTOMY    . WISDOM TOOTH EXTRACTION      There were no vitals filed for this visit.      Subjective Assessment - 06/30/16 1451    Subjective (P)  2/10. Did balance at home on board   Currently in Pain? (P)  Yes   Pain Score (P)  2    Pain Location (P)  Ankle   Pain Orientation (P)  Left   Pain Descriptors / Indicators (P)  Aching   Pain Type (P)  Chronic pain   Pain Onset (P)  More than a month ago   Pain Frequency (P)  Constant                         [facility] Adult PT Treatment/Exercise - 06/30/16 0001      Neuro Re-ed    Neuro Re-ed Details  Baps , bosu balance with cues to elevate medial foot/ archwith BOSU inverted  , inch height, then single leg balance LT with  with step over 8 inch step with cues to keep weight on stance leg before shift to none stance leg and then balance single leg and tandem on 1/8fam roller inverted  in tandem , sit fit stance Lt foot balacning . All done in door way for safety.  Then RT foot slides back with sance on flexed Lt leg withhut and with arm raises in  front with 3 pounds and with 10 pounds each hand at side     Ankle Exercises: Aerobic   Stationary Bike L4 6 min                PT Education - 06/30/16 1537    Education provided Yes   Education Details POC put on hold, HEP   Person(s) Educated Patient   Methods Explanation;Demonstration;Tactile cues;Verbal cues;Handout   Comprehension Returned demonstration;Verbalized understanding             PT Long Term Goals - 06/30/16 1541      PT LONG TERM GOAL #1   Title He will be able to do all HEP issued as of last visit   Status Achieved     PT LONG TERM GOAL #2   Title He will report pain decreased 50% or more ith getting out of chair   Status Achieved     PT LONG TERM GOAL #3   Title He will report able to  walk  1.5 miles with min to no increased pain   Status Partially Met      PT LONG TERM GOAL #4   Title                 Plan - 06/30/16 1448    Clinical Impression Statement Pain levels have basically stabilized and he is doing well with exercise. He is doing all HEP. Feels mostly fatigue rather than incr pain post session. I think I have given him as much as I can and he appears to be doing exercise at home. I will have him on hold x 4 weeks and if he is doing well and does not need anthing will discharg. He can call to follow up with me if needed in that time   PT Treatment/Interventions Cryotherapy;Moist Heat;Balance training;Therapeutic exercise;Patient/family education;Manual techniques;Taping   PT Next Visit Plan doing well at that time   PT Home Exercise Plan Greeen band exercises and contrast bath, black band  single leg balance on none compliant surface, balance on I/2 Rolle , posterior RT leg slides with and without UE weight   Consulted and Agree with Plan of Care Patient      Patient will benefit from skilled therapeutic intervention in order to improve the following deficits and impairments:  Difficulty walking, Pain, Decreased activity tolerance, Decreased strength, Postural dysfunction, Abnormal gait  Visit Diagnosis: Instability of ankle joint, left  Pain in left ankle and joints of left foot  Muscle weakness (generalized)  Difficulty walking     Problem List Patient Active Problem List   Diagnosis Date Noted  . Colon cancer screening 08/27/2014  . Lateral epicondylitis (tennis elbow)   . Tennis elbow 03/29/2011  . BACK PAIN, LUMBAR 05/23/2008  . PES PLANUS 05/23/2008    [name]  PT 06/30/2016, 3:42 PM  [facility] [address] [place], [place], [address] Phone: [phone]   Fax:  [phone]  Name: Brady [name] MRN: [id number] Date of Birth: 23-Nov-1970 PHYSICAL THERAPY DISCHARGE SUMMARY  Visits from Start of Care: 6  Current functional level  related to goals / functional outcomes: See above   Remaining deficits: See above   Education / Equipment: HEP Plan: Patient agrees to discharge.  Patient goals were partially met. Patient is being discharged due to                                                     ?????  Max rehab potential    Noralee Stain, PT  08/02/16    1:18 PM

## 2016-07-01 ENCOUNTER — Ambulatory Visit (INDEPENDENT_AMBULATORY_CARE_PROVIDER_SITE_OTHER): Payer: 59 | Admitting: Family Medicine

## 2016-07-01 ENCOUNTER — Encounter: Payer: Self-pay | Admitting: Family Medicine

## 2016-07-01 VITALS — BP 130/80 | Ht 75.0 in | Wt 265.0 lb

## 2016-07-01 DIAGNOSIS — Z0000 Encounter for general adult medical examination without abnormal findings: Secondary | ICD-10-CM

## 2016-07-01 DIAGNOSIS — Z125 Encounter for screening for malignant neoplasm of prostate: Secondary | ICD-10-CM | POA: Diagnosis not present

## 2016-07-01 DIAGNOSIS — R030 Elevated blood-pressure reading, without diagnosis of hypertension: Secondary | ICD-10-CM | POA: Diagnosis not present

## 2016-07-01 DIAGNOSIS — Z13220 Encounter for screening for lipoid disorders: Secondary | ICD-10-CM | POA: Diagnosis not present

## 2016-07-01 DIAGNOSIS — Z131 Encounter for screening for diabetes mellitus: Secondary | ICD-10-CM | POA: Diagnosis not present

## 2016-07-01 NOTE — Progress Notes (Signed)
   Subjective:    Patient ID: Brady [name], male    DOB: 09-21-1970, 46 y.o.   MRN: [id number]  HPI The patient comes in today for a wellness visit.    A review of their health history was completed.  A review of medications was also completed.  Any needed refills; no  Eating habits: mostly healthy-could do better  Falls/  MVA accidents in past few months: none  Regular exercise: pretty much  Specialist pt sees on regular basis: Dr Oneida Alar, Dr Aline Brochure  Preventative health issues were discussed.   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  Negative for activity change, appetite change and fever.  HENT: Negative for congestion and rhinorrhea.   Eyes: Negative for discharge.  Respiratory: Negative for cough and wheezing.   Cardiovascular: Negative for chest pain.  Gastrointestinal: Negative for abdominal pain, blood in stool and vomiting.  Genitourinary: Negative for difficulty urinating and frequency.  Musculoskeletal: Negative for neck pain.  Skin: Negative for rash.  Allergic/Immunologic: Negative for environmental allergies and food allergies.  Neurological: Negative for weakness and headaches.  Psychiatric/Behavioral: Negative for agitation.  All other systems reviewed and are negative.      Objective:   Physical Exam  Constitutional: He appears well-developed and well-nourished.  HENT:  Head: Normocephalic and atraumatic.  Right Ear: External ear normal.  Left Ear: External ear normal.  Nose: Nose normal.  Mouth/Throat: Oropharynx is clear and moist.  Eyes: EOM are normal. Pupils are equal, round, and reactive to light.  Neck: Normal range of motion. Neck supple. No thyromegaly present.    Cardiovascular: Normal rate, regular rhythm and normal heart sounds.   No murmur heard. Pulmonary/Chest: Effort normal and breath sounds normal. No respiratory distress. He has no wheezes.  Abdominal: Soft. Bowel sounds are normal. He exhibits no distension and no mass. There is no tenderness.  Genitourinary: Penis normal.  Musculoskeletal: Normal range of motion. He exhibits no edema.  Lymphadenopathy:    He has no cervical adenopathy.  Neurological: He is alert. He exhibits normal muscle tone.  Skin: Skin is warm and dry. No erythema.  Psychiatric: He has a normal mood and affect. His behavior is normal. Judgment normal.  Vitals reviewed.         Assessment & Plan:  Impression well adult exam diet exercise discussed. Up-to-date on vaccinations. #2 obesity discussed encourage lose weight #3 elevated blood pressure. Not enough medications discussed plan above plus blood work

## 2016-07-02 LAB — BASIC METABOLIC PANEL
BUN / CREAT RATIO: 15 (ref 9–20)
BUN: 17 mg/dL (ref 6–24)
CO2: 25 mmol/L (ref 18–29)
CREATININE: 1.15 mg/dL (ref 0.76–1.27)
Calcium: 9.1 mg/dL (ref 8.7–10.2)
Chloride: 103 mmol/L (ref 96–106)
GFR calc non Af Amer: 75 mL/min/{1.73_m2} (ref >59)
GFR, EST AFRICAN AMERICAN: 87 mL/min/{1.73_m2} (ref >59)
GLUCOSE: 99 mg/dL (ref 65–99)
Potassium: 4.6 mmol/L (ref 3.5–5.2)
SODIUM: 142 mmol/L (ref 134–144)

## 2016-07-02 LAB — LIPID PANEL
CHOL/HDL RATIO: 3.9 ratio (ref 0.0–5.0)
Cholesterol, Total: 197 mg/dL (ref 100–199)
HDL: 50 mg/dL (ref >39)
LDL Calculated: 126 mg/dL — ABNORMAL HIGH (ref 0–99)
TRIGLYCERIDES: 105 mg/dL (ref 0–149)
VLDL CHOLESTEROL CAL: 21 mg/dL (ref 5–40)

## 2016-07-02 LAB — HEPATIC FUNCTION PANEL
ALBUMIN: 4.5 g/dL (ref 3.5–5.5)
ALT: 24 IU/L (ref 0–44)
AST: 21 IU/L (ref 0–40)
Alkaline Phosphatase: 61 IU/L (ref 39–117)
BILIRUBIN TOTAL: 0.5 mg/dL (ref 0.0–1.2)
Bilirubin, Direct: 0.12 mg/dL (ref 0.00–0.40)
Total Protein: 6.8 g/dL (ref 6.0–8.5)

## 2016-07-02 LAB — PSA: Prostate Specific Ag, Serum: 1.5 ng/mL (ref 0.0–4.0)

## 2016-07-03 ENCOUNTER — Encounter: Payer: Self-pay | Admitting: Family Medicine

## 2016-07-04 ENCOUNTER — Encounter: Payer: Self-pay | Admitting: Orthopedic Surgery

## 2016-07-04 ENCOUNTER — Ambulatory Visit (INDEPENDENT_AMBULATORY_CARE_PROVIDER_SITE_OTHER): Payer: 59 | Admitting: Orthopedic Surgery

## 2016-07-04 DIAGNOSIS — M2142 Flat foot [pes planus] (acquired), left foot: Secondary | ICD-10-CM

## 2016-07-04 DIAGNOSIS — M25372 Other instability, left ankle: Secondary | ICD-10-CM | POA: Diagnosis not present

## 2016-07-04 DIAGNOSIS — M2141 Flat foot [pes planus] (acquired), right foot: Secondary | ICD-10-CM | POA: Diagnosis not present

## 2016-07-04 DIAGNOSIS — M25572 Pain in left ankle and joints of left foot: Secondary | ICD-10-CM | POA: Diagnosis not present

## 2016-07-04 NOTE — Progress Notes (Signed)
Patient ID: Brady [name], male   DOB: 1970-03-24, 47 y.o.   MRN: [id number]  Chief Complaint  Patient presents with  . Follow-up    left ankle    HPI 46 year old male with ankle pain and instability treated with physical therapy and ASO bracing comes in a little bit better in terms of stability but still having anterior ankle pain in the joint which is activity related. He did get some over-the-counter orthotics to [employer] and he's been in those 4 week ROS  No mechanical symptoms of giving way except only intermittently  There were no vitals taken for this visit. Gen. appearance is normal grooming and hygiene Orientation to person place and time normal Mood normal Gait is normal normal   No peripheral edema or swelling is noted in the left foot and ankle Sensory exam shows normal sensation to palpation, pressure and soft touch Skin exam no lacerations ulcerations or erythema  Ortho Exam We do see flatfoot deformity is flexible. He has tenderness in the front of the ankle joint none anterolaterally  A/P  Medical decision-making  Continue orthotics I don't think he'll get much pain relief from ankle instability surgery  I think he is having a lot of medial and anterior pain from the flatfoot problem  Follow-up 3 months after orthotic wear  Encounter Diagnoses  Name Primary?  . Pes planus of both feet Yes  . Pain of joint of left ankle and foot   . Ankle instability, left     Arther Abbott, MD 07/04/2016 4:30 PM

## 2016-10-04 ENCOUNTER — Ambulatory Visit (INDEPENDENT_AMBULATORY_CARE_PROVIDER_SITE_OTHER): Payer: 59 | Admitting: Orthopedic Surgery

## 2016-10-04 DIAGNOSIS — M2141 Flat foot [pes planus] (acquired), right foot: Secondary | ICD-10-CM | POA: Diagnosis not present

## 2016-10-04 DIAGNOSIS — M25372 Other instability, left ankle: Secondary | ICD-10-CM

## 2016-10-04 DIAGNOSIS — M2142 Flat foot [pes planus] (acquired), left foot: Secondary | ICD-10-CM | POA: Diagnosis not present

## 2016-10-04 DIAGNOSIS — M25572 Pain in left ankle and joints of left foot: Secondary | ICD-10-CM

## 2016-10-04 NOTE — Progress Notes (Signed)
Routine follow-up  46 year old male with bilateral pes planus and left ankle instability treated with physical therapy and ASO bracing presents for follow-up visit  Review of Systems  Neurological: Negative for tingling.     General well-developed well-nourished grooming hygiene normal  Gait normal tandem heel-toe gait with positive for progression angle bilaterally Ortho Exam  The patient has bilateral pes planus it is flexible. He has no pain really on the medial side of the ankle. He does have lateral impingement pain which is probably from the pronation and flatfoot  His ankle drawer shows 1+ firm endpoint with sulcus sign but no pain with drawer maneuver he has excellent eversion strength to manual muscle testing is neurovascularly intact.  Encounter Diagnoses  Name Primary?  . Pes planus of both feet Yes  . Pain of joint of left ankle and foot   . Ankle instability, left    He will continue with bilateral foot orthotics to control the pronation  He will wear an ASO brace for activity and sports and unlevel ground   He will see me on an as-needed basis

## 2016-12-15 IMAGING — CR DG ELBOW 2V*R*
2 series · 2 of 2 positions shown · non-contrast
Comparison: None

CLINICAL DATA: RIGHT elbow pain, tennis elbow for years

EXAM:
RIGHT ELBOW - 2 VIEW

[elbow ap]
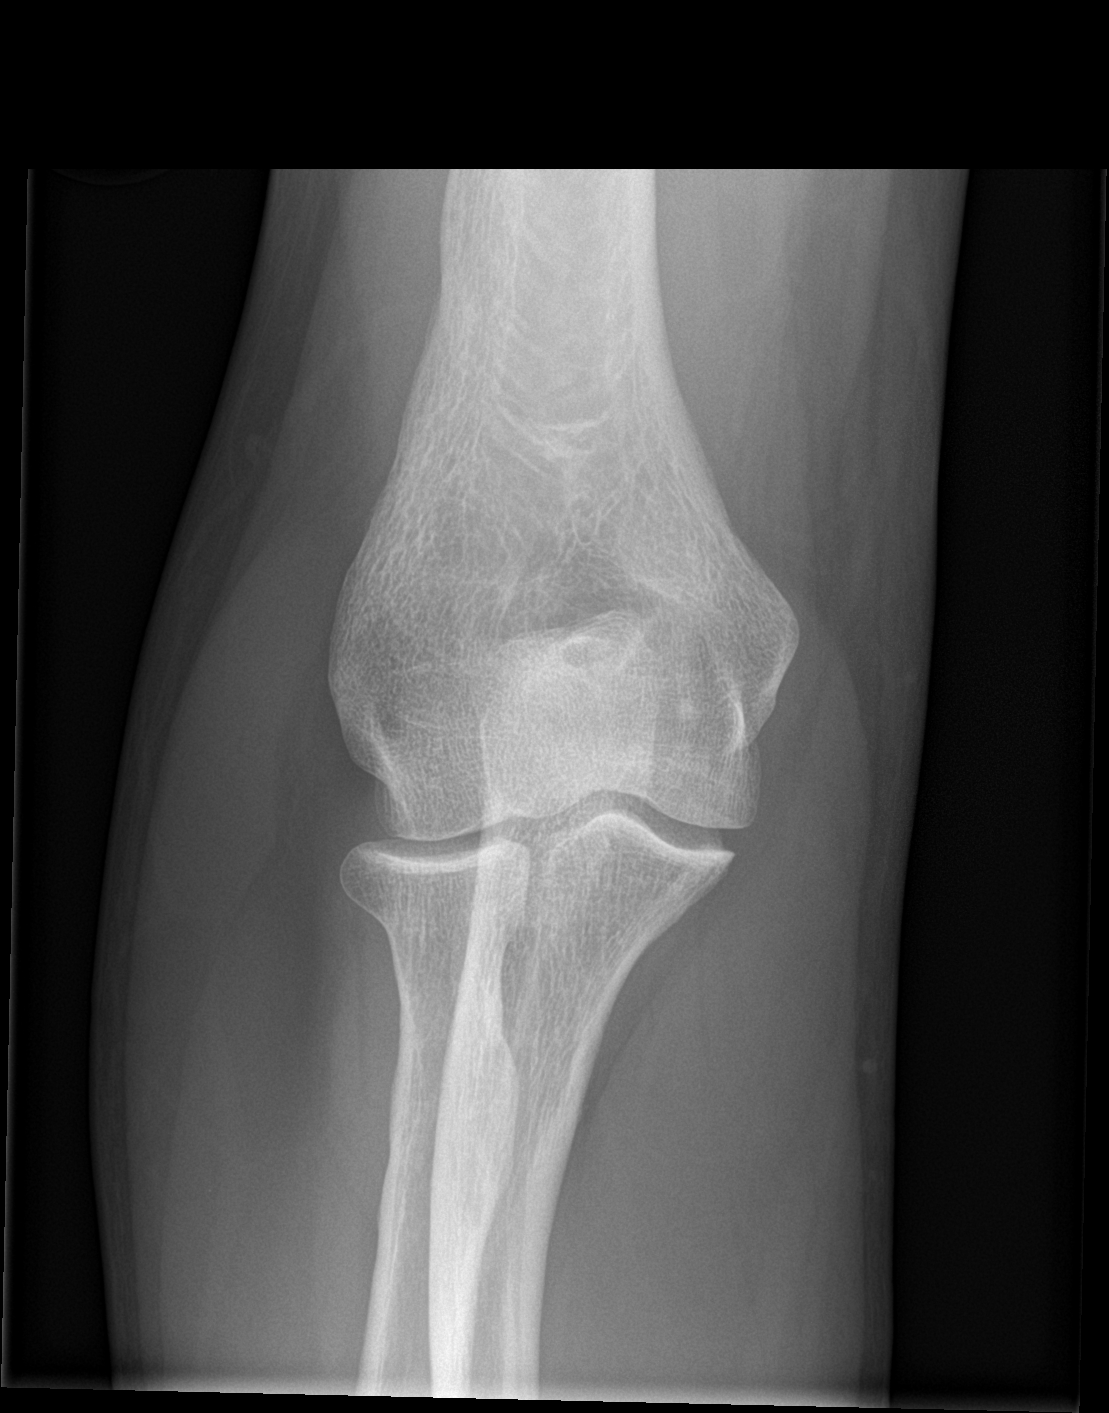

[elbow lat]
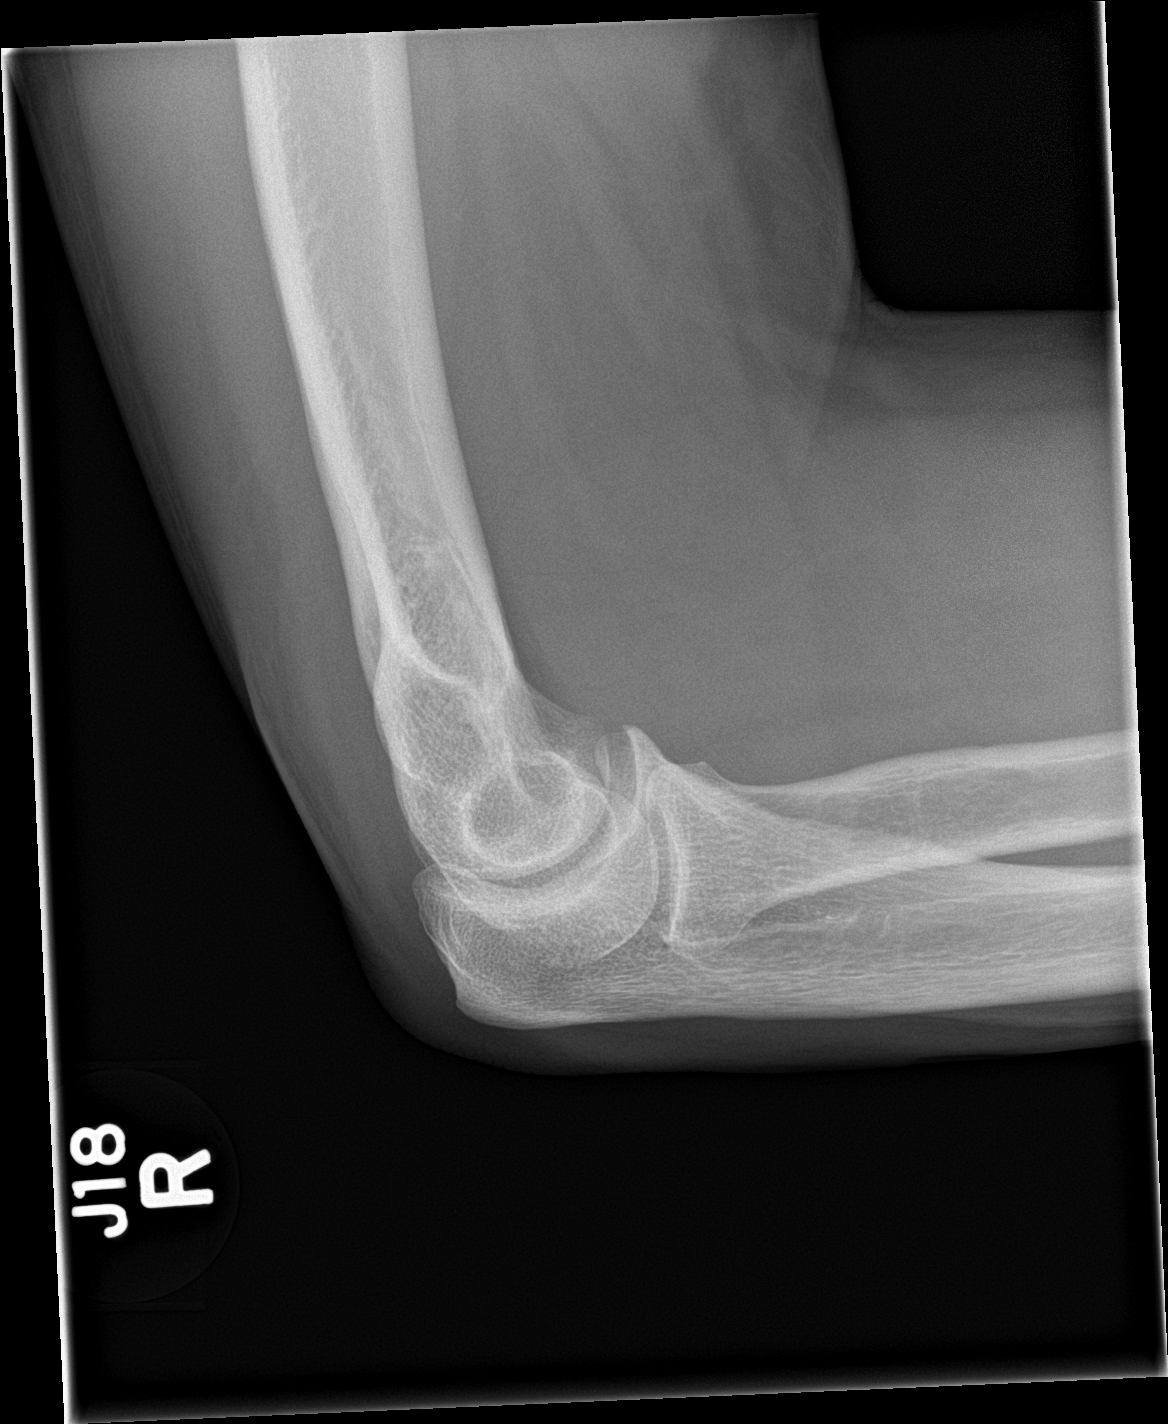

[2 of 2 positions shown; findings below may reference images not displayed]

FINDINGS: Bone mineralization normal.

Joint spaces preserved.

No fracture, dislocation, or bone destruction.

No joint effusion.
IMPRESSION: Normal exam.

## 2016-12-26 DIAGNOSIS — K2950 Unspecified chronic gastritis without bleeding: Secondary | ICD-10-CM | POA: Diagnosis not present

## 2017-03-21 DIAGNOSIS — K1329 Other disturbances of oral epithelium, including tongue: Secondary | ICD-10-CM | POA: Diagnosis not present

## 2017-03-21 MED FILL — HYDROCODON-APAP 5-325: 5-325 | 1 days supply | Qty: 8 | Fill #0

## 2017-03-21 MED FILL — CHLORHEXIDINE 0.12% RINSE: 0.12 | 16 days supply | Qty: 473 | Fill #0

## 2017-03-29 DIAGNOSIS — K1329 Other disturbances of oral epithelium, including tongue: Secondary | ICD-10-CM | POA: Diagnosis not present

## 2017-09-27 DIAGNOSIS — K1329 Other disturbances of oral epithelium, including tongue: Secondary | ICD-10-CM | POA: Diagnosis not present

## 2017-10-27 DIAGNOSIS — K1321 Leukoplakia of oral mucosa, including tongue: Secondary | ICD-10-CM | POA: Diagnosis not present

## 2017-10-28 ENCOUNTER — Encounter: Payer: Self-pay | Admitting: Family Medicine

## 2017-11-03 DIAGNOSIS — K1329 Other disturbances of oral epithelium, including tongue: Secondary | ICD-10-CM | POA: Diagnosis not present

## 2017-11-07 ENCOUNTER — Ambulatory Visit (HOSPITAL_COMMUNITY)
Admission: RE | Admit: 2017-11-07 | Discharge: 2017-11-07 | Disposition: A | Discharge status: Home / Self Care | Payer: 59 | Source: Ambulatory Visit | Attending: Family Medicine | Admitting: Family Medicine

## 2017-11-07 ENCOUNTER — Ambulatory Visit: Payer: 59 | Admitting: Family Medicine

## 2017-11-07 ENCOUNTER — Encounter: Payer: Self-pay | Admitting: Family Medicine

## 2017-11-07 VITALS — BP 130/80 | Wt 263.6 lb

## 2017-11-07 DIAGNOSIS — M5136 Other intervertebral disc degeneration, lumbar region: Secondary | ICD-10-CM | POA: Insufficient documentation

## 2017-11-07 DIAGNOSIS — M545 Low back pain: Secondary | ICD-10-CM | POA: Diagnosis not present

## 2017-11-07 DIAGNOSIS — M5137 Other intervertebral disc degeneration, lumbosacral region: Secondary | ICD-10-CM | POA: Insufficient documentation

## 2017-11-07 DIAGNOSIS — R5383 Other fatigue: Secondary | ICD-10-CM | POA: Diagnosis not present

## 2017-11-07 DIAGNOSIS — R55 Syncope and collapse: Secondary | ICD-10-CM

## 2017-11-07 NOTE — Progress Notes (Signed)
   Subjective:    Patient ID: Brady [name], male    DOB: [birth year], 47 y.o.   MRN: [id number] Patient arrives with numerous concerns HPI  Patient arrives to discuss a vasovagal syncope episode he has while having a procedure.  Patient had had a oral tongue procedure.  Received local anesthetic.  Was getting up to go over the belt lightheadedness.  And passed out.  Developed seizure-like activity.  After that was a bit subdued went back to normal.  Also notes slight achiness in the joints and muscles with frequent tick bites this year.  Family has researched this and are worried about potential Lyme disease.  Patient notes low back pain.  10 months duration.  Worse in the morning.  Worse with movements.  Patient trying to exercise and stretch at some       Review of Systems No headache, no major weight loss or weight gain, no chest pain no back pain abdominal pain no change in bowel habits complete ROS otherwise negative     Objective:   Physical Exam  Alert and oriented, vitals reviewed and stable, NAD ENT-TM's and ext canals WNL bilat via otoscopic exam Soft palate, tonsils and post pharynx WNL via oropharyngeal exam Neck-symmetric, no masses; thyroid nonpalpable and nontender Pulmonary-no tachypnea or accessory muscle use; Clear without wheezes via auscultation Card--no abnrml murmurs, rhythm reg and rate WNL Carotid pulses symmetric, without bruits Diffuse lumbar pain to palpation.  Negative straight leg raise.      Assessment & Plan:  Impression 1 vasovagal episode with seizure-like activity.  Discussed at length.  The seizure-like activity occurs due to hypo-oxygenation of brain tissue.  Very transient.  Not a true seizure.  Does not meet full seizure work-up rationale discussed  2.  Chronic low back pain.  Time for an x-ray.  Sounds musculoskeletal  3.  Lyme disease concerned.  Will check blood work to be on safe side I doubt  Greater than 50% of this 25 minute  face to face visit was spent in counseling and discussion and coordination of care regarding the above diagnosis/diagnosies

## 2017-11-08 LAB — B. BURGDORFI ANTIBODIES: Lyme IgG/IgM Ab: <0.91 {ISR} (ref 0.00–0.90)

## 2018-02-18 ENCOUNTER — Telehealth: Payer: 59 | Admitting: Physician Assistant

## 2018-02-18 NOTE — Progress Notes (Signed)
Person submitting e visit for daughter through his own mychart. Erroneous encounter. Will bot complete evisit.

## 2018-03-07 ENCOUNTER — Encounter: Payer: Self-pay | Admitting: Family Medicine

## 2018-04-03 ENCOUNTER — Telehealth: Payer: Self-pay | Admitting: Family Medicine

## 2018-04-03 DIAGNOSIS — Z125 Encounter for screening for malignant neoplasm of prostate: Secondary | ICD-10-CM

## 2018-04-03 DIAGNOSIS — R030 Elevated blood-pressure reading, without diagnosis of hypertension: Secondary | ICD-10-CM

## 2018-04-03 DIAGNOSIS — Z13220 Encounter for screening for lipoid disorders: Secondary | ICD-10-CM

## 2018-04-03 NOTE — Telephone Encounter (Signed)
Pt scheduled for wellness 04/12/18 with Dr.Steve, requesting blood work.

## 2018-04-03 NOTE — Telephone Encounter (Signed)
Blood work ordered in Epic. Patient notified. 

## 2018-04-03 NOTE — Telephone Encounter (Signed)
Rep same 

## 2018-04-03 NOTE — Telephone Encounter (Signed)
Last labs on 07/01/2016 lipid, bmet,hepatic and psa. Please advise. Thank you

## 2018-04-11 DIAGNOSIS — Z125 Encounter for screening for malignant neoplasm of prostate: Secondary | ICD-10-CM | POA: Diagnosis not present

## 2018-04-11 DIAGNOSIS — Z13220 Encounter for screening for lipoid disorders: Secondary | ICD-10-CM | POA: Diagnosis not present

## 2018-04-11 DIAGNOSIS — R030 Elevated blood-pressure reading, without diagnosis of hypertension: Secondary | ICD-10-CM | POA: Diagnosis not present

## 2018-04-12 ENCOUNTER — Encounter: Payer: Self-pay | Admitting: Family Medicine

## 2018-04-12 ENCOUNTER — Ambulatory Visit (INDEPENDENT_AMBULATORY_CARE_PROVIDER_SITE_OTHER): Payer: 59 | Admitting: Family Medicine

## 2018-04-12 VITALS — BP 142/94 | Ht 72.75 in | Wt 267.0 lb

## 2018-04-12 DIAGNOSIS — Z23 Encounter for immunization: Secondary | ICD-10-CM | POA: Diagnosis not present

## 2018-04-12 DIAGNOSIS — Z0000 Encounter for general adult medical examination without abnormal findings: Secondary | ICD-10-CM

## 2018-04-12 DIAGNOSIS — I10 Essential (primary) hypertension: Secondary | ICD-10-CM | POA: Diagnosis not present

## 2018-04-12 LAB — LIPID PANEL
CHOLESTEROL TOTAL: 199 mg/dL (ref 100–199)
Chol/HDL Ratio: 4.5 ratio (ref 0.0–5.0)
HDL: 44 mg/dL (ref >39)
LDL Calculated: 136 mg/dL — ABNORMAL HIGH (ref 0–99)
TRIGLYCERIDES: 94 mg/dL (ref 0–149)
VLDL CHOLESTEROL CAL: 19 mg/dL (ref 5–40)

## 2018-04-12 LAB — BASIC METABOLIC PANEL
BUN/Creatinine Ratio: 17 (ref 9–20)
BUN: 18 mg/dL (ref 6–24)
CHLORIDE: 105 mmol/L (ref 96–106)
CO2: 25 mmol/L (ref 20–29)
CREATININE: 1.08 mg/dL (ref 0.76–1.27)
Calcium: 9.6 mg/dL (ref 8.7–10.2)
GFR calc Af Amer: 93 mL/min/{1.73_m2} (ref >59)
GFR calc non Af Amer: 80 mL/min/{1.73_m2} (ref >59)
Glucose: 103 mg/dL — ABNORMAL HIGH (ref 65–99)
POTASSIUM: 4.9 mmol/L (ref 3.5–5.2)
SODIUM: 144 mmol/L (ref 134–144)

## 2018-04-12 LAB — HEPATIC FUNCTION PANEL
ALBUMIN: 4.4 g/dL (ref 4.0–5.0)
ALK PHOS: 55 IU/L (ref 39–117)
ALT: 21 IU/L (ref 0–44)
AST: 21 IU/L (ref 0–40)
Bilirubin Total: 0.4 mg/dL (ref 0.0–1.2)
Bilirubin, Direct: 0.12 mg/dL (ref 0.00–0.40)
Total Protein: 6.7 g/dL (ref 6.0–8.5)

## 2018-04-12 LAB — PSA: PROSTATE SPECIFIC AG, SERUM: 1.7 ng/mL (ref 0.0–4.0)

## 2018-04-12 MED ORDER — AMLODIPINE BESYLATE 5 MG PO TABS: 5.0000 mg | ORAL_TABLET | Freq: Every day | ORAL | 1 refills | Status: DC

## 2018-04-12 MED FILL — AMLODIPINE BESYLATE 5 MG TA: 5 | 90 days supply | Qty: 90 | Fill #0

## 2018-04-12 NOTE — Progress Notes (Signed)
Subjective:    Patient ID: Brady [name], male    DOB: Feb 07, 1971, 48 y.o.   MRN: [id number]  HPI The patient comes in today for a wellness visit.    A review of their health history was completed.  A review of medications was also completed.  Any needed refills; not taking any prescription meds.   Eating habits: health conscious  Falls/  MVA accidents in past few months: none  Regular exercise: walking, weights, yard work  Sales promotion account executive pt sees on regular basis: none  Preventative health issues were discussed.   Additional concerns: none   Results for orders placed or performed in visit on 04/03/18  Lipid panel  Result Value Ref Range   Cholesterol, Total 199 100 - 199 mg/dL   Triglycerides 94 0 - 149 mg/dL   HDL 44 >39 mg/dL   VLDL Cholesterol Cal 19 5 - 40 mg/dL   LDL Calculated 136 (H) 0 - 99 mg/dL   Chol/HDL Ratio 4.5 0.0 - 5.0 ratio  Hepatic function panel  Result Value Ref Range   Total Protein 6.7 6.0 - 8.5 g/dL   Albumin 4.4 4.0 - 5.0 g/dL   Bilirubin Total 0.4 0.0 - 1.2 mg/dL   Bilirubin, Direct 0.12 0.00 - 0.40 mg/dL   Alkaline Phosphatase 55 39 - 117 IU/L   AST 21 0 - 40 IU/L   ALT 21 0 - 44 IU/L  Basic metabolic panel  Result Value Ref Range   Glucose 103 (H) 65 - 99 mg/dL   BUN 18 6 - 24 mg/dL   Creatinine, Ser 1.08 0.76 - 1.27 mg/dL   GFR calc non Af Amer 80 >59 mL/min/1.73   GFR calc Af Amer 93 >59 mL/min/1.73   BUN/Creatinine Ratio 17 9 - 20   Sodium 144 134 - 144 mmol/L   Potassium 4.9 3.5 - 5.2 mmol/L   Chloride 105 96 - 106 mmol/L   CO2 25 20 - 29 mmol/L   Calcium 9.6 8.7 - 10.2 mg/dL  PSA  Result Value Ref Range   Prostate Specific Ag, Serum 1.7 0.0 - 4.0 ng/mL   Exercise not so good , left knee bothering fair amnt, looking trowards eventul knee replacement    supplent    Fa had rec  Latest coon 2016   Aching and pain and discofor   Pretty decnt  Stressful at work , 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  HENT: Negative for congestion and rhinorrhea.   Eyes: Negative for discharge.  Respiratory: Negative for cough and wheezing.   Cardiovascular: Negative for chest pain.  Gastrointestinal: Negative for abdominal pain, blood in stool and vomiting.  Genitourinary: Negative for difficulty urinating and frequency.  Musculoskeletal: Negative for neck pain.  Skin: Negative for rash.  Allergic/Immunologic: Negative for environmental allergies and food allergies.  Neurological: Negative for weakness and headaches.  Psychiatric/Behavioral: Negative for agitation.  All other systems reviewed and are negative.      Objective:   Physical Exam Vitals signs reviewed.  Constitutional:      Appearance: He is well-developed.  HENT:     Head: Normocephalic and atraumatic.     Right Ear: External ear normal.     Left Ear: External ear normal.     Nose: Nose normal.  Eyes:     Pupils: Pupils are equal, round, and reactive to light.  Neck:     Musculoskeletal: Normal  range of motion and neck supple.     Thyroid: No thyromegaly.  Cardiovascular:     Rate and Rhythm: Normal rate and regular rhythm.     Heart sounds: Normal heart sounds. No murmur.  Pulmonary:     Effort: Pulmonary effort is normal. No respiratory distress.     Breath sounds: Normal breath sounds. No wheezing.  Abdominal:     General: Bowel sounds are normal. There is no distension.     Palpations: Abdomen is soft. There is no mass.     Tenderness: There is no abdominal tenderness.  Genitourinary:    Penis: Normal.   Musculoskeletal: Normal range of motion.  Lymphadenopathy:     Cervical: No cervical adenopathy.  Skin:    General: Skin is warm and dry.     Findings: No erythema.  Neurological:     Mental Status: He is alert.     Motor: No abnormal muscle tone.  Psychiatric:        Behavior: Behavior normal.        Judgment: Judgment normal.            Assessment & Plan:  Impression wellness exam.  Diet discussed.  Exercise discussed.  Vaccines discussed and administered were appropriate.  Blood work reviewed and discussed.  Up-to-date on colonoscopy next one due 2021  2.  Hyperlipidemia discussed an issue but none of her medicines  3.  Hypertension.  Time to press on with medications.  Discussed we watch elevated numbers for several years now.  Options discussed.  With tendency towards vasovagal lightheadedness would recommend Norvasc rather than angiotensin inhibitor.  Start 5 nightly.

## 2018-04-12 NOTE — Patient Instructions (Signed)

## 2018-05-03 ENCOUNTER — Encounter: Payer: Self-pay | Admitting: Family Medicine

## 2018-07-09 MED FILL — AMLODIPINE BESYLATE 5 MG TA: 5 | 90 days supply | Qty: 90 | Fill #0

## 2018-10-09 ENCOUNTER — Other Ambulatory Visit: Payer: Self-pay

## 2018-10-09 ENCOUNTER — Ambulatory Visit (INDEPENDENT_AMBULATORY_CARE_PROVIDER_SITE_OTHER): Payer: 59 | Admitting: Family Medicine

## 2018-10-09 DIAGNOSIS — I10 Essential (primary) hypertension: Secondary | ICD-10-CM

## 2018-10-09 MED ORDER — AMLODIPINE BESYLATE 5 MG PO TABS: 5.0000 mg | ORAL_TABLET | Freq: Every day | ORAL | 1 refills | Status: DC

## 2018-10-09 MED FILL — AMLODIPINE BESYLATE 5 MG TA: 5 | 90 days supply | Qty: 90 | Fill #0

## 2018-10-09 NOTE — Progress Notes (Signed)
   Subjective:    Patient ID: Brady [name], male    DOB: Sep 27, 1970, 48 y.o.   MRN: [id number]  Hypertension This is a chronic problem. Treatments tried: amlodipine 5mg . Compliance problems include exercise and diet (takes med every day, could do better with diet and exercise).    Pt states no concerns today.   Virtual Visit via Video Note  I connected with CHAN SHEAHAN on 10/09/18 at  2:00 PM EDT by a video enabled telemedicine application and verified that I am speaking with the correct person using two identifiers.  Location: Patient: home Provider: office   I discussed the limitations of evaluation and management by telemedicine and the availability of in person appointments. The patient expressed understanding and agreed to proceed.  History of Present Illness:    Observations/Objective:   Assessment and Plan:   Follow Up Instructions:    I discussed the assessment and treatment plan with the patient. The patient was provided an opportunity to ask questions and all were answered. The patient agreed with the plan and demonstrated an understanding of the instructions.   The patient was advised to call back or seek an in-person evaluation if the symptoms worsen or if the condition fails to improve as anticipated.  I provided 20 minutes of non-face-to-face time during this encounter.   Blood pressure medicine and blood pressure levels reviewed today with patient. Compliant with blood pressure medicine. States does not miss a dose. No obvious side effects. Blood pressure generally good when checked elsewhere. Watching salt intake.   Patient notes some blood pressures borderline.  Overall good.  Has not completely kept up with diet and exercise  Review of Systems No headache, no major weight loss or weight gain, no chest pain no back pain abdominal pain no change in bowel habits complete ROS otherwise negative     Objective:   Physical Exam  Virtual     Assessment & Plan:  Impression hypertension.  Control good though not perfect.  Discussed.  Diet exercise discussed.  Not ideal.  Patient is going to work harder in this regard.  Maintain blood pressure meds keep an eye on numbers.  Follow-up in 6 months

## 2018-10-09 NOTE — Progress Notes (Signed)
   Subjective:    Patient ID: Brady [name], male    DOB: Nov 29, 1970, 48 y.o.   MRN: [id number]  HPI    Review of Systems     Objective:   Physical Exam        Assessment & Plan:

## 2018-10-13 ENCOUNTER — Encounter: Payer: Self-pay | Admitting: Family Medicine

## 2018-11-20 ENCOUNTER — Ambulatory Visit (INDEPENDENT_AMBULATORY_CARE_PROVIDER_SITE_OTHER): Payer: 59 | Admitting: Family Medicine

## 2018-11-20 ENCOUNTER — Other Ambulatory Visit: Payer: Self-pay

## 2018-11-20 DIAGNOSIS — L049 Acute lymphadenitis, unspecified: Secondary | ICD-10-CM | POA: Diagnosis not present

## 2018-11-20 DIAGNOSIS — R6889 Other general symptoms and signs: Secondary | ICD-10-CM

## 2018-11-20 DIAGNOSIS — Z20822 Contact with and (suspected) exposure to covid-19: Secondary | ICD-10-CM

## 2018-11-20 NOTE — Progress Notes (Signed)
   Subjective:    Patient ID: Brady [name], male    DOB: [birth year], 48 y.o.   MRN: [id number]  HPI lump on right side of neck. Came up about one week ago. Not painful.  Stuffy nose and sore throat started 3 days ago.   Virtual Visit via Video Note  I connected with Brady [name] on 11/20/18 at  2:00 PM EDT by a video enabled telemedicine application and verified that I am speaking with the correct person using two identifiers.  Location: Patient: home Provider: office   I discussed the limitations of evaluation and management by telemedicine and the availability of in person appointments. The patient expressed understanding and agreed to proceed.  History of Present Illness:    Observations/Objective:   Assessment and Plan:   Follow Up Instructions:    I discussed the assessment and treatment plan with the patient. The patient was provided an opportunity to ask questions and all were answered. The patient agreed with the plan and demonstrated an understanding of the instructions.   The patient was advised to call back or seek an in-person evaluation if the symptoms worsen or if the condition fails to improve as anticipated.  I provided 18 minutes of non-face-to-face time during this encounter.   Patient also gives several day history of congestion drainage.  A bit of achiness wife had similar illness.  Patient's daughter's boyfriend had substantial illness.  Was tested 19 this returned negative  Was stung by a caterpillar.  Right posterior shoulder.  This occurred a few days before the lump appeared in the lower part of his neck on that side.   Review of Systems No headache no chest pain no shortness of breath no major cough    Objective:   Physical Exam  Virtual patient does point towards a swelling under the skin which appears to be an enlarged lymph node in the lower cervical chain      Assessment & Plan:  Impression 1 reactive lymph node secondary  to caterpillar stain should fade over the coming weeks  2.  Acute respiratory symptoms along with others in the family would recommend some screening for COVID-19 rationale discussed

## 2018-11-21 ENCOUNTER — Other Ambulatory Visit: Payer: Self-pay

## 2018-11-21 DIAGNOSIS — Z20822 Contact with and (suspected) exposure to covid-19: Secondary | ICD-10-CM

## 2018-11-21 DIAGNOSIS — R6889 Other general symptoms and signs: Secondary | ICD-10-CM | POA: Diagnosis not present

## 2018-11-23 LAB — NOVEL CORONAVIRUS, NAA: SARS-CoV-2, NAA: NOT DETECTED

## 2019-01-28 MED FILL — AMLODIPINE BESYLATE 5 MG TA: 5 | 90 days supply | Qty: 90 | Fill #0

## 2019-04-03 ENCOUNTER — Encounter: Payer: Self-pay | Admitting: Family Medicine

## 2019-04-30 ENCOUNTER — Other Ambulatory Visit: Payer: Self-pay | Admitting: Family Medicine

## 2019-04-30 MED FILL — AMLODIPINE BESYLATE 5 MG TA: 5 | 90 days supply | Qty: 90 | Fill #0

## 2019-04-30 NOTE — Telephone Encounter (Signed)
Scheduled 3/23

## 2019-04-30 NOTE — Telephone Encounter (Signed)
Contact pt and sched f u for thtn within month  May ref times one

## 2019-04-30 NOTE — Telephone Encounter (Signed)
lvm to schedule visit  

## 2019-05-21 ENCOUNTER — Encounter: Payer: Self-pay | Admitting: Family Medicine

## 2019-05-21 ENCOUNTER — Other Ambulatory Visit: Payer: Self-pay

## 2019-05-21 ENCOUNTER — Ambulatory Visit (INDEPENDENT_AMBULATORY_CARE_PROVIDER_SITE_OTHER): Payer: 59 | Admitting: Family Medicine

## 2019-05-21 VITALS — BP 143/88

## 2019-05-21 DIAGNOSIS — Z13220 Encounter for screening for lipoid disorders: Secondary | ICD-10-CM

## 2019-05-21 DIAGNOSIS — Z125 Encounter for screening for malignant neoplasm of prostate: Secondary | ICD-10-CM

## 2019-05-21 DIAGNOSIS — Z79899 Other long term (current) drug therapy: Secondary | ICD-10-CM | POA: Diagnosis not present

## 2019-05-21 DIAGNOSIS — I10 Essential (primary) hypertension: Secondary | ICD-10-CM

## 2019-05-21 MED ORDER — AMLODIPINE BESYLATE 5 MG PO TABS: 5.0000 mg | ORAL_TABLET | Freq: Every day | ORAL | 1 refills | Status: DC

## 2019-05-21 NOTE — Progress Notes (Signed)
   Subjective:  Audio plus videoPatient ID: Brady [name], male    DOB: 02-01-1971, 49 y.o.   MRN: [id number]  Hypertension This is a chronic problem. Risk factors for coronary artery disease include male gender. There are no compliance problems (pt takes Amlodipine 5 mg daily).    Virtual Visit via Video Note  I connected with Kallie Locks on 05/21/19 at  8:40 AM EDT by a video enabled telemedicine application and verified that I am speaking with the correct person using two identifiers.  Location: Patient: home Provider: office   I discussed the limitations of evaluation and management by telemedicine and the availability of in person appointments. The patient expressed understanding and agreed to proceed.  History of Present Illness:    Observations/Objective:   Assessment and Plan:   Follow Up Instructions:    I discussed the assessment and treatment plan with the patient. The patient was provided an opportunity to ask questions and all were answered. The patient agreed with the plan and demonstrated an understanding of the instructions.   The patient was advised to call back or seek an in-person evaluation if the symptoms worsen or if the condition fails to improve as anticipated.  I provided 22 minutes of non-face-to-face time during this encounter.  Blood pressure medicine and blood pressure levels reviewed today with patient. Compliant with blood pressure medicine. States does not miss a dose. No obvious side effects. Blood pressure generally good when checked elsewhere. Watching salt intake.   Today however blood pressure is somewhat elevated.   Patient admits to minimal exercise these days.  Injured his back with strain with his boxer.  Slowly getting better.  Hopes to get back into regular exercise shortly   Review of Systems No headache, no major weight loss or weight gain, no chest pain no back pain abdominal pain no change in bowel habits complete  ROS otherwise negative     Objective:   Physical Exam  Virtual      Assessment & Plan:  Impression 1 hypertension.  Good control discussed to maintain same meds diet exercise discussed  2.  Covid vaccine.  Discussed and encouraged with patient's risk factors  Physical in the spring.  Appropriate blood work.  Diet exercise discussed.  Medications refilled

## 2019-07-09 ENCOUNTER — Encounter: Payer: 59 | Admitting: Family Medicine

## 2019-07-09 DIAGNOSIS — Z79899 Other long term (current) drug therapy: Secondary | ICD-10-CM | POA: Diagnosis not present

## 2019-07-09 DIAGNOSIS — I10 Essential (primary) hypertension: Secondary | ICD-10-CM | POA: Diagnosis not present

## 2019-07-09 DIAGNOSIS — Z13220 Encounter for screening for lipoid disorders: Secondary | ICD-10-CM | POA: Diagnosis not present

## 2019-07-09 DIAGNOSIS — Z125 Encounter for screening for malignant neoplasm of prostate: Secondary | ICD-10-CM | POA: Diagnosis not present

## 2019-07-10 LAB — HEPATIC FUNCTION PANEL
ALT: 25 IU/L (ref 0–44)
AST: 23 IU/L (ref 0–40)
Albumin: 4.7 g/dL (ref 4.0–5.0)
Alkaline Phosphatase: 74 IU/L (ref 39–117)
Bilirubin Total: 0.4 mg/dL (ref 0.0–1.2)
Bilirubin, Direct: 0.11 mg/dL (ref 0.00–0.40)
Total Protein: 7 g/dL (ref 6.0–8.5)

## 2019-07-10 LAB — BASIC METABOLIC PANEL
BUN/Creatinine Ratio: 15 (ref 9–20)
BUN: 14 mg/dL (ref 6–24)
CO2: 25 mmol/L (ref 20–29)
Calcium: 9.6 mg/dL (ref 8.7–10.2)
Chloride: 103 mmol/L (ref 96–106)
Creatinine, Ser: 0.93 mg/dL (ref 0.76–1.27)
GFR calc Af Amer: 110 mL/min/{1.73_m2} (ref >59)
GFR calc non Af Amer: 95 mL/min/{1.73_m2} (ref >59)
Glucose: 96 mg/dL (ref 65–99)
Potassium: 4.5 mmol/L (ref 3.5–5.2)
Sodium: 138 mmol/L (ref 134–144)

## 2019-07-10 LAB — LIPID PANEL
Chol/HDL Ratio: 4.5 ratio (ref 0.0–5.0)
Cholesterol, Total: 187 mg/dL (ref 100–199)
HDL: 42 mg/dL (ref >39)
LDL Chol Calc (NIH): 116 mg/dL — ABNORMAL HIGH (ref 0–99)
Triglycerides: 163 mg/dL — ABNORMAL HIGH (ref 0–149)
VLDL Cholesterol Cal: 29 mg/dL (ref 5–40)

## 2019-07-10 LAB — PSA: Prostate Specific Ag, Serum: 1.9 ng/mL (ref 0.0–4.0)

## 2019-07-12 ENCOUNTER — Encounter: Payer: Self-pay | Admitting: Family Medicine

## 2019-07-12 ENCOUNTER — Ambulatory Visit (INDEPENDENT_AMBULATORY_CARE_PROVIDER_SITE_OTHER): Payer: 59 | Admitting: Family Medicine

## 2019-07-12 ENCOUNTER — Other Ambulatory Visit: Payer: Self-pay

## 2019-07-12 VITALS — BP 132/88 | Temp 96.1°F | Ht 72.0 in | Wt 277.6 lb

## 2019-07-12 DIAGNOSIS — Z0000 Encounter for general adult medical examination without abnormal findings: Secondary | ICD-10-CM

## 2019-07-12 MED ORDER — AMLODIPINE BESYLATE 5 MG PO TABS: 5.0000 mg | ORAL_TABLET | Freq: Every day | ORAL | 1 refills | Status: DC

## 2019-07-12 NOTE — Progress Notes (Signed)
Subjective:    Patient ID: Brady [name], male    DOB: 07/26/1970, 49 y.o.   MRN: [id number]  HPI The patient comes in today for a wellness visit.    A review of their health history was completed.  A review of medications was also completed.  Any needed refills; Amlodipine (90 day)  Eating habits: eats healthy   Falls/  MVA accidents in past few months: fell once this weekend; fell once before when dogs tripped him. No injuries   Regular exercise: none  Specialist pt sees on regular basis: none  Preventative health issues were discussed.   Additional concerns:  Results for orders placed or performed in visit on 05/21/19  Lipid Profile  Result Value Ref Range   Cholesterol, Total 187 100 - 199 mg/dL   Triglycerides 163 (H) 0 - 149 mg/dL   HDL 42 >39 mg/dL   VLDL Cholesterol Cal 29 5 - 40 mg/dL   LDL Chol Calc (NIH) 116 (H) 0 - 99 mg/dL   Chol/HDL Ratio 4.5 0.0 - 5.0 ratio  Hepatic function panel  Result Value Ref Range   Total Protein 7.0 6.0 - 8.5 g/dL   Albumin 4.7 4.0 - 5.0 g/dL   Bilirubin Total 0.4 0.0 - 1.2 mg/dL   Bilirubin, Direct 0.11 0.00 - 0.40 mg/dL   Alkaline Phosphatase 74 39 - 117 IU/L   AST 23 0 - 40 IU/L   ALT 25 0 - 44 IU/L  Basic Metabolic Panel (BMET)  Result Value Ref Range   Glucose 96 65 - 99 mg/dL   BUN 14 6 - 24 mg/dL   Creatinine, Ser 0.93 0.76 - 1.27 mg/dL   GFR calc non Af Amer 95 >59 mL/min/1.73   GFR calc Af Amer 110 >59 mL/min/1.73   BUN/Creatinine Ratio 15 9 - 20   Sodium 138 134 - 144 mmol/L   Potassium 4.5 3.5 - 5.2 mmol/L   Chloride 103 96 - 106 mmol/L   CO2 25 20 - 29 mmol/L   Calcium 9.6 8.7 - 10.2 mg/dL  PSA  Result Value Ref Range   Prostate Specific Ag, Serum 1.9 0.0 - 4.0 ng/mL     Review of Systems No headache, no major weight loss or weight gain, no chest pain no back pain abdominal pain no change in bowel habits complete ROS otherwise negative     Objective:   Physical Exam Vitals reviewed.    Constitutional:      Appearance: He is well-developed.  HENT:     Head: Normocephalic and atraumatic.     Right Ear: External ear normal.     Left Ear: External ear normal.     Nose: Nose normal.  Eyes:     Pupils: Pupils are equal, round, and reactive to light.  Neck:     Thyroid: No thyromegaly.  Cardiovascular:     Rate and Rhythm: Normal rate and regular rhythm.     Heart sounds: Normal heart sounds. No murmur.  Pulmonary:     Effort: Pulmonary effort is normal. No respiratory distress.     Breath sounds: Normal breath sounds. No wheezing.  Abdominal:     General: Bowel sounds are normal. There is no distension.     Palpations: Abdomen is soft. There is no mass.     Tenderness: There is no abdominal tenderness.  Genitourinary:    Penis: Normal.   Musculoskeletal:        General: Normal range of motion.  Cervical back: Normal range of motion and neck supple.  Lymphadenopathy:     Cervical: No cervical adenopathy.  Skin:    General: Skin is warm and dry.     Findings: No erythema.  Neurological:     Mental Status: He is alert.     Motor: No abnormal muscle tone.  Psychiatric:        Behavior: Behavior normal.        Judgment: Judgment normal.    Prostate within normal limits       Assessment & Plan:  Impression wellness exam.  Diet discussed.  Exercise discussed.  Vaccines discussed.  Patient still thinking about the Covid vaccine.  Colonoscopy due this fall left side on  Blood pressure good control.  Meds maintained

## 2019-07-30 MED FILL — AMLODIPINE BESYLATE 5 MG TA: 5 | 90 days supply | Qty: 90 | Fill #0

## 2019-12-16 DIAGNOSIS — Z20828 Contact with and (suspected) exposure to other viral communicable diseases: Secondary | ICD-10-CM | POA: Diagnosis not present

## 2020-01-07 ENCOUNTER — Other Ambulatory Visit: Payer: Self-pay

## 2020-01-07 ENCOUNTER — Encounter: Payer: Self-pay | Admitting: Family Medicine

## 2020-01-07 ENCOUNTER — Ambulatory Visit (INDEPENDENT_AMBULATORY_CARE_PROVIDER_SITE_OTHER): Payer: 59 | Admitting: Family Medicine

## 2020-01-07 VITALS — BP 132/90 | HR 69 | Temp 98.4°F | Ht 72.0 in | Wt 286.0 lb

## 2020-01-07 DIAGNOSIS — I10 Essential (primary) hypertension: Secondary | ICD-10-CM

## 2020-01-07 NOTE — Progress Notes (Signed)
Patient ID: Brady [name], male    DOB: 22-Oct-1970, 49 y.o.   MRN: [id number]   Chief Complaint  Patient presents with  . Hypertension   Subjective:    HPI  htn check up. Pt states no concerns today.   If bend over can have some dizziness occ. Been on bp meds for about 1 yr. Taking norvasc at night.  Taking lots of vitamins.  utd on flu covid vaccine. Had colonsocopy in 2016. Father h/o of rectal cancer. Uncle h/o colon cancer. H/o prostate cancer both sides.  Medical History Cassady has a past medical history of Allergic rhinitis, Anxiety, Chronic pansinusitis, GERD (gastroesophageal reflux disease), IBS (irritable bowel syndrome), Mitral prolapse, and Reflux.   Outpatient Encounter Medications as of 01/07/2020  Medication Sig  . cholecalciferol (VITAMIN D) 1000 UNITS tablet Take 1,000 Units by mouth daily.  Marland Kitchen ECHINACEA PO Take 1 tablet by mouth daily.  . fexofenadine (ALLEGRA) 180 MG tablet Take 180 mg by mouth daily.  . Flaxseed, Linseed, (FLAX SEED OIL PO) Take 1 capsule by mouth daily.  . Ginger, Zingiber officinalis, (GINGER PO) Take 1 capsule by mouth daily.  . Glucosamine HCl (GLUCOSAMINE PO) Take 2 tablets by mouth daily.   Marland Kitchen KRILL OIL PO Take 1 capsule by mouth daily.  . Multiple Vitamin (MULTIVITAMIN WITH MINERALS) TABS tablet Take 1 tablet by mouth daily.  Marland Kitchen POTASSIUM PO Take 1 tablet by mouth daily.   . TURMERIC PO Take 1 tablet by mouth daily.  . vitamin C (ASCORBIC ACID) 500 MG tablet Take 500 mg by mouth daily.  . [DISCONTINUED] amLODipine (NORVASC) 5 MG tablet Take 1 tablet (5 mg total) by mouth at bedtime.   No facility-administered encounter medications on file as of 01/07/2020.     Review of Systems  Constitutional: Negative for chills and fever.  HENT: Negative for congestion, rhinorrhea and sore throat.   Respiratory: Negative for cough, shortness of breath and wheezing.   Cardiovascular: Negative for chest pain and leg swelling.    Gastrointestinal: Negative for abdominal pain, diarrhea, nausea and vomiting.  Genitourinary: Negative for dysuria and frequency.  Skin: Negative for rash.  Neurological: Negative for dizziness, weakness and headaches.     Vitals BP 132/90 Comment: 132/88 on recheck  Pulse 69   Temp 98.4 F (36.9 C)   Ht 6' (1.829 m)   Wt 286 lb (129.7 kg)   SpO2 98%   BMI 38.79 kg/m   Objective:   Physical Exam Vitals and nursing note reviewed.  Constitutional:      General: He is not in acute distress.    Appearance: Normal appearance. He is not ill-appearing.  HENT:     Head: Normocephalic.     Nose: Nose normal. No congestion.     Mouth/Throat:     Mouth: Mucous membranes are moist.     Pharynx: No oropharyngeal exudate.  Eyes:     Extraocular Movements: Extraocular movements intact.     Conjunctiva/sclera: Conjunctivae normal.     Pupils: Pupils are equal, round, and reactive to light.  Cardiovascular:     Rate and Rhythm: Normal rate and regular rhythm.     Pulses: Normal pulses.     Heart sounds: Normal heart sounds. No murmur heard.   Pulmonary:     Effort: Pulmonary effort is normal.     Breath sounds: Normal breath sounds. No wheezing, rhonchi or rales.  Musculoskeletal:        General: Normal range of motion.  Right lower leg: No edema.     Left lower leg: No edema.  Skin:    General: Skin is warm and dry.     Findings: No rash.  Neurological:     General: No focal deficit present.     Mental Status: He is alert and oriented to person, place, and time.     Cranial Nerves: No cranial nerve deficit.  Psychiatric:        Mood and Affect: Mood normal.        Behavior: Behavior normal.        Thought Content: Thought content normal.        Judgment: Judgment normal.      Assessment and Plan   1. Essential hypertension   htn- suboptimal, was on norvasc 5mg , will inc to 10mg  daily. Will recheck on [facility] visit. Last labs 5/21- stable. Reviewed.  Labs on  next visit.  F/u 77mo or prn.

## 2020-01-15 ENCOUNTER — Other Ambulatory Visit: Payer: Self-pay | Admitting: Family Medicine

## 2020-01-15 MED ORDER — AMLODIPINE BESYLATE 10 MG PO TABS: 10.0000 mg | ORAL_TABLET | Freq: Every day | ORAL | 0 refills | Status: DC

## 2020-01-15 NOTE — Progress Notes (Signed)
Changed norvasc from 5mg  to 10mg  in AM. Sent new script to pharmacy. Dr. Lovena Le

## 2020-01-21 ENCOUNTER — Other Ambulatory Visit: Payer: Self-pay

## 2020-01-21 MED ORDER — AMLODIPINE BESYLATE 10 MG PO TABS: 10.0000 mg | ORAL_TABLET | Freq: Every day | ORAL | 0 refills | Status: DC

## 2020-01-21 MED FILL — AMLODIPINE BESYLATE 10 MG T: 10 | 90 days supply | Qty: 90 | Fill #0

## 2020-04-03 ENCOUNTER — Encounter: Payer: Self-pay | Admitting: Family Medicine

## 2020-04-03 ENCOUNTER — Ambulatory Visit (INDEPENDENT_AMBULATORY_CARE_PROVIDER_SITE_OTHER): Payer: 59 | Admitting: Family Medicine

## 2020-04-03 ENCOUNTER — Other Ambulatory Visit: Payer: Self-pay

## 2020-04-03 ENCOUNTER — Other Ambulatory Visit: Payer: Self-pay | Admitting: Family Medicine

## 2020-04-03 VITALS — BP 130/86 | HR 72 | Temp 98.3°F | Resp 18

## 2020-04-03 DIAGNOSIS — R002 Palpitations: Secondary | ICD-10-CM | POA: Diagnosis not present

## 2020-04-03 DIAGNOSIS — I498 Other specified cardiac arrhythmias: Secondary | ICD-10-CM

## 2020-04-03 DIAGNOSIS — J019 Acute sinusitis, unspecified: Secondary | ICD-10-CM

## 2020-04-03 MED ORDER — DOXYCYCLINE HYCLATE 100 MG PO TABS: 100.0000 mg | ORAL_TABLET | Freq: Two times a day (BID) | ORAL | 0 refills | Status: DC

## 2020-04-03 MED FILL — DOXYCYCLINE HYCLATE 100 MG: 100 | 10 days supply | Qty: 20 | Fill #0

## 2020-04-03 NOTE — Progress Notes (Signed)
Patient ID: Brady [name], male    DOB: 11-May-1970, 50 y.o.   MRN: [id number]   No chief complaint on file.  Subjective:  CC: sinus congestion and heart palpitations with exercise  This is a new problem. Presents today with a complaint of sinus congestion, and heart palpitations while walking the dog. Reports these palpitations have been occurring when he is walking up the hill. Feels like he has a missed beat and gets slightly lightheaded. Symptoms have been present for 2 days. Has a history of hypertension, his PCP has just recently increased his dosage of his medication in the last 3 to 4 months. He reports that his blood pressures at home have been elevated, better today at the office. He took at home Covid test yesterday, the result was negative. His symptoms include fatigue, congestion, sinus pain and pressure, cough, chest discomfort with palpitations lightheadedness, and headaches. He does have a history of mitral valve prolapse, has never had any treatment or any issues with this in the past.   sinus congestion. Started a couple days ago. At home covid test yesterday was negative.   Elevated bp and tightness across chest, heart fluttering when walking. Started about one month ago.    Medical History Brady [name] has a past medical history of Allergic rhinitis, Anxiety, Chronic pansinusitis, GERD (gastroesophageal reflux disease), IBS (irritable bowel syndrome), Mitral prolapse, and Reflux.   Outpatient Encounter Medications as of 04/03/2020  Medication Sig  . amLODipine (NORVASC) 10 MG tablet Take 1 tablet (10 mg total) by mouth daily.  . cholecalciferol (VITAMIN D) 1000 UNITS tablet Take 1,000 Units by mouth daily.  Marland Kitchen doxycycline (VIBRA-TABS) 100 MG tablet Take 1 tablet (100 mg total) by mouth 2 (two) times daily.  Marland Kitchen ECHINACEA PO Take 1 tablet by mouth daily.  . fexofenadine (ALLEGRA) 180 MG tablet Take 180 mg by mouth daily.  . Flaxseed, Linseed, (FLAX SEED OIL PO) Take 1 capsule  by mouth daily.  . Ginger, Zingiber officinalis, (GINGER PO) Take 1 capsule by mouth daily.  . Glucosamine HCl (GLUCOSAMINE PO) Take 2 tablets by mouth daily.   Marland Kitchen KRILL OIL PO Take 1 capsule by mouth daily.  . Multiple Vitamin (MULTIVITAMIN WITH MINERALS) TABS tablet Take 1 tablet by mouth daily.  Marland Kitchen POTASSIUM PO Take 1 tablet by mouth daily.   . TURMERIC PO Take 1 tablet by mouth daily.  . vitamin C (ASCORBIC ACID) 500 MG tablet Take 500 mg by mouth daily.   No facility-administered encounter medications on file as of 04/03/2020.     Review of Systems  Constitutional: Positive for fatigue. Negative for chills and fever.  HENT: Positive for congestion, sinus pressure and sinus pain. Negative for sore throat.   Respiratory: Positive for cough. Negative for shortness of breath.   Cardiovascular: Positive for chest pain and palpitations.       Tightness across chest, MVP history. palpation with exercise  Gastrointestinal: Negative for abdominal pain, diarrhea, nausea and vomiting.  Neurological: Positive for light-headedness and headaches.     Vitals BP 130/86   Pulse 72   Temp 98.3 F (36.8 C)   Resp 18   SpO2 99%   Objective:   Physical Exam Vitals reviewed.  HENT:     Nose:     Right Sinus: Maxillary sinus tenderness present.     Left Sinus: Maxillary sinus tenderness present.  Cardiovascular:     Rate and Rhythm: Normal rate and regular rhythm.     Heart sounds:  Normal heart sounds.  Pulmonary:     Effort: Pulmonary effort is normal.     Breath sounds: Normal breath sounds.  Skin:    General: Skin is warm and dry.  Neurological:     General: No focal deficit present.     Mental Status: He is alert.  Psychiatric:        Behavior: Behavior normal.      Assessment and Plan   1. Fluttering heart - EKG 12-Lead  2. Heart palpitations - Ambulatory referral to Cardiology  3. Acute rhinosinusitis - doxycycline (VIBRA-TABS) 100 MG tablet; Take 1 tablet (100 mg  total) by mouth 2 (two) times daily.  Dispense: 20 tablet; Refill: 0   EKG performed today in office. Normal sinus rhythm, no ectopy, no ST elevation. Reviewed EKG with Dr. Lovena Le, PCP. Due to palpitations with exercise, will make an urgent cardiology referral for further assessment and possible heart monitoring. Recommend decreasing caffeine intake, avoid energy drinks, any other herbal supplements.  Due to significant maxillary sinus pain, will treat for sinusitis with antibiotics for 10 days.  Agrees with plan of care discussed today. Understands warning signs to seek further care: chest pain, shortness of breath, any significant change in health.  Understands to follow-up with cardiology. EKG normal today in office. Reiterated reasons to seek emergency room care, encouraged him to have a low threshold if his pain, symptoms changed or became more severe. Understands his risk factors include being male, and overweight with hypertension.   Pecolia Ades, NP 04/03/2020

## 2020-04-05 ENCOUNTER — Encounter: Payer: Self-pay | Admitting: Family Medicine

## 2020-04-07 NOTE — Progress Notes (Signed)
Cardiology Office Note:   Date:  04/08/2020  NAME:  Brady [name]    MRN: [id number] DOB:  08-Dec-1970   PCP:  Erven Colla, DO  Cardiologist:  No primary care provider on file.   Referring MD: Chalmers Guest, NP   Chief Complaint  Patient presents with  . Palpitations   History of Present Illness:   Brady [name] is a 50 y.o. male with a hx of GERD, obesity who is being seen today for the evaluation of palpitations at the request of Erven Colla, DO. EKG at PCP normal.   He reports the last 4 to 6 months has had shortness of breath with exertion.  This is associated with palpitations.  He reports when he does any heavy exertion his heart races.  Symptoms resolved with cessation of activity.  He does get chest tightness with this occurs intermittently.  Can occur at any time.  He apparently has gained 40 pounds with the recent COVID-19 pandemic.  He is working from home.  He is not exercising as much as he should.  He reports when he does walk roughly 1 mile per day he does get quite winded.  Any he will or strenuous activity makes him short of breath and the palpitations ensue.  He reports he is also gotten up in the middle the night to go to the bathroom and had shortness of breath or palpitations.  No concerns for sleep apnea per his report.  He does snore but is not tired during the day.  His blood pressure is 152/90.  This is not controlled.  He had issues with this over the last 1 month.  I suspect this is all weight gain related.  Family history significant for heart disease in a grandparent.  He does not smoke, drink alcohol or use drugs.  He works in Librarian, academic.  [facility] [employer].  Most recent lipid profile shows total cholesterol 197, HDL 42, LDL 116, triglycerides 163.  He has normal kidney function.  No recent TSH.  He has a history of mitral valve prolapse.  No murmur on examination today.  No recent echocardiogram in our system.  Past Medical  History: Past Medical History:  Diagnosis Date  . Allergic rhinitis   . Anxiety   . Chronic pansinusitis   . GERD (gastroesophageal reflux disease)   . Hypertension   . IBS (irritable bowel syndrome)   . Mitral prolapse   . Reflux     Past Surgical History: Past Surgical History:  Procedure Laterality Date  . CARPAL TUNNEL RELEASE    . COLONOSCOPY    . COLONOSCOPY N/A 10/13/2014   Procedure: COLONOSCOPY;  Surgeon: Danie Binder, MD;  [facility];  Service: Endoscopy;  Laterality: N/A;  0830   . FRONTAL SINUS EXPLORATION Bilateral 01/30/2015   Procedure: FRONTAL SINUS EXPLORATION;  Surgeon: Izora Gala, MD;  Location: [place];  Service: ENT;  Laterality: Bilateral;  . LATERAL EPICONDYLE RELEASE  07/08/2011   Procedure: TENNIS ELBOW RELEASE;  Surgeon: Carole Civil, MD;  Location: [facility];  Service: Orthopedics;  Laterality: Left;  Left Tennis Elbow Release  . NASAL SINUS SURGERY Bilateral 01/30/2015   Procedure:  BILATERAL ENDOSCOPIC MAXILLARY, ETHMOID AND FRONTAL SINUS SURGERY;  Surgeon: Izora Gala, MD;  Location: [place];  Service: ENT;  Laterality: Bilateral;  . TENNIS ELBOW RELEASE/NIRSCHEL PROCEDURE Right 08/08/2014   Procedure: RIGHT TENNIS ELBOW RELEASE;  Surgeon: Carole Civil,  MD;  Location: [facility];  Service: Orthopedics;  Laterality: Right;  . TONSILLECTOMY    . WISDOM TOOTH EXTRACTION      Current Medications: Current Meds  Medication Sig  . amLODipine (NORVASC) 10 MG tablet Take 1 tablet (10 mg total) by mouth daily.  . cholecalciferol (VITAMIN D) 1000 UNITS tablet Take 1,000 Units by mouth daily.  Marland Kitchen doxycycline (VIBRA-TABS) 100 MG tablet Take 1 tablet (100 mg total) by mouth 2 (two) times daily.  Marland Kitchen ECHINACEA PO Take 1 tablet by mouth daily.  . fexofenadine (ALLEGRA) 180 MG tablet Take 180 mg by mouth daily.  . Flaxseed, Linseed, (FLAX SEED OIL PO) Take 1 capsule by mouth daily.  . Ginger, Zingiber officinalis,  (GINGER PO) Take 1 capsule by mouth daily.  . Glucosamine HCl (GLUCOSAMINE PO) Take 2 tablets by mouth daily.   . hydrochlorothiazide (HYDRODIURIL) 25 MG tablet Take 1 tablet (25 mg total) by mouth daily.  Marland Kitchen KRILL OIL PO Take 1 capsule by mouth daily.  . metoprolol tartrate (LOPRESSOR) 50 MG tablet Take 1 tablet by mouth once for procedure.  . Multiple Vitamin (MULTIVITAMIN WITH MINERALS) TABS tablet Take 1 tablet by mouth daily.  Marland Kitchen POTASSIUM PO Take 1 tablet by mouth daily.   . TURMERIC PO Take 1 tablet by mouth daily.  . vitamin C (ASCORBIC ACID) 500 MG tablet Take 500 mg by mouth daily.     Allergies:    Penicillins   Social History: Social History   Socioeconomic History  . Marital status: Married    Spouse name: Not on file  . Number of children: 1  . Years of education: college  . Highest education level: Not on file  Occupational History  . Occupation: computer IT  Tobacco Use  . Smoking status: Never Smoker  . Smokeless tobacco: Never Used  . Tobacco comment: Never smoked  Substance and Sexual Activity  . Alcohol use: Yes    Alcohol/week: 0.0 standard drinks    Comment: one beer a month  . Drug use: No  . Sexual activity: Not on file  Other Topics Concern  . Not on file  Social History Narrative  . Not on file   Social Determinants of Health   Financial Resource Strain: Not on file  Food Insecurity: Not on file  Transportation Needs: Not on file  Physical Activity: Not on file  Stress: Not on file  Social Connections: Not on file    Family History: The patient's family history includes Diabetes in his father and another family member; Heart attack in his maternal grandfather; Hypertension in his father and mother.  ROS:   All other ROS reviewed and negative. Pertinent positives noted in the HPI.     EKGs/Labs/Other Studies Reviewed:   The following studies were personally reviewed by me today:  EKG:  EKG is ordered today.  The ekg ordered today  demonstrates normal sinus rhythm heart rate 65, no acute ischemic changes, no evidence of prior infarction, and was personally reviewed by me.   Recent Labs: 07/09/2019: ALT 25; BUN 14; Creatinine, Ser 0.93; Potassium 4.5; Sodium 138   Recent Lipid Panel    Component Value Date/Time   CHOL 187 07/09/2019 0942   TRIG 163 (H) 07/09/2019 0942   HDL 42 07/09/2019 0942   CHOLHDL 4.5 07/09/2019 0942   LDLCALC 116 (H) 07/09/2019 0942    Physical Exam:   VS:  BP (!) 152/90   Pulse 65   Ht 6' (1.829 m)  Wt 293 lb 6.4 oz (133.1 kg)   SpO2 99%   BMI 39.79 kg/m    Wt Readings from Last 3 Encounters:  04/08/20 293 lb 6.4 oz (133.1 kg)  01/07/20 286 lb (129.7 kg)  07/12/19 277 lb 9.6 oz (125.9 kg)    General: Well nourished, well developed, in no acute distress Head: Atraumatic, normal size  Eyes: PEERLA, EOMI  Neck: Supple, no JVD Endocrine: No thryomegaly Cardiac: Normal S1, S2; RRR; no murmurs, rubs, or gallops Lungs: Clear to auscultation bilaterally, no wheezing, rhonchi or rales  Abd: Soft, nontender, no hepatomegaly  Ext: No edema, pulses 2+ Musculoskeletal: No deformities, BUE and BLE strength normal and equal Skin: Warm and dry, no rashes   Neuro: Alert and oriented to person, place, time, and situation, CNII-XII grossly intact, no focal deficits  Psych: Normal mood and affect   ASSESSMENT:   BOBBY BARTON is a 50 y.o. male who presents for the following: 1. Palpitations   2. Chest pain, unspecified type   3. SOB (shortness of breath)   4. Primary hypertension   5. Obesity (BMI 30-39.9)     PLAN:   1. Palpitations -Palpitations occur with exertion.  Also short of breath.  He needs an echocardiogram.  Also recommended a 7-day Zio patch to exclude arrhythmia.  He needs updated labs including a CBC, TSH and BMP.  We will also check a BNP to exclude heart failure.  2. Chest pain, unspecified type -Atypical chest tightness.  Could be stress related.  Coronary CTA  recommended.  50 mg metoprolol tartrate 2 hours before the scan.  We also will get a calcium score with this for further risk ratification.  3. SOB (shortness of breath) -Shortness of breath with exertion.  Work-up for arrhythmia and CAD as above.  Echocardiogram as well.  History of mitral valve prolapse but no murmur.  Will reassess with an echo.  4. Primary hypertension -BP not at goal.  Continue amlodipine 10 mg daily.  Add HCTZ 25 mg daily.  5. Obesity (BMI 30-39.9) -Diet and exercise recommended.  Disposition: Return in about 3 months (around 07/06/2020).  Medication Adjustments/Labs and Tests Ordered: Current medicines are reviewed at length with the patient today.  Concerns regarding medicines are outlined above.  Orders Placed This Encounter  Procedures  . CT CORONARY MORPH W/CTA COR W/SCORE W/CA W/CM &/OR WO/CM  . CT CORONARY FRACTIONAL FLOW RESERVE DATA PREP  . CT CORONARY FRACTIONAL FLOW RESERVE FLUID ANALYSIS  . Brain natriuretic peptide  . TSH  . Basic metabolic panel  . CBC  . LONG TERM MONITOR (3-14 DAYS)  . EKG 12-Lead  . ECHOCARDIOGRAM COMPLETE   Meds ordered this encounter  Medications  . metoprolol tartrate (LOPRESSOR) 50 MG tablet    Sig: Take 1 tablet by mouth once for procedure.    Dispense:  1 tablet    Refill:  0  . hydrochlorothiazide (HYDRODIURIL) 25 MG tablet    Sig: Take 1 tablet (25 mg total) by mouth daily.    Dispense:  90 tablet    Refill:  3    Patient Instructions  Medication Instructions:  Take Metoprolol 50 mg two hours before your CT when scheduled. Start HCTZ 25 mg daily  *If you need a refill on your cardiac medications before your next appointment, please call your pharmacy*   Lab Work: BMET, TSH, CBC, BNP  If you have labs (blood work) drawn today and your tests are completely normal, you will receive  your results only by: Marland Kitchen MyChart Message (if you have MyChart) OR . A paper copy in the mail If you have any lab test that is  abnormal or we need to change your treatment, we will call you to review the results.   Testing/Procedures:  Echocardiogram - Your physician has requested that you have an echocardiogram. Echocardiography is a painless test that uses sound waves to create images of your heart. It provides your doctor with information about the size and shape of your heart and how well your heart's chambers and valves are working. This procedure takes approximately one hour. There are no restrictions for this procedure. This will be performed at our [facility] location - [address], [address].  Your physician has requested that you have cardiac CT. Cardiac computed tomography (CT) is a painless test that uses an x-ray machine to take clear, detailed pictures of your heart. For further information please visit HugeFiesta.tn. Please follow instruction sheet as given.   ZIO XT- Long Term Monitor Instructions   Your physician has requested you wear your ZIO patch monitor____7___days.   This is a single patch monitor.  [employer] supplies one patch monitor per enrollment.  Additional stickers are not available.   Please do not apply patch if you will be having a Nuclear Stress Test, Echocardiogram, Cardiac CT, MRI, or Chest Xray during the time frame you would be wearing the monitor. The patch cannot be worn during these tests.  You cannot remove and re-apply the ZIO XT patch monitor.   Your ZIO patch monitor will be sent USPS Priority mail from [facility] directly to your home address. The monitor may also be mailed to a PO BOX if home delivery is not available.   It may take 3-5 days to receive your monitor after you have been enrolled.   Once you have received you monitor, please review enclosed instructions.  Your monitor has already been registered assigning a specific monitor serial # to you.   Applying the monitor   Shave hair from upper left chest.   Hold abrader disc by orange tab.  Rub  abrader in 40 strokes over left upper chest as indicated in your monitor instructions.   Clean area with 4 enclosed alcohol pads .  Use all pads to assure are is cleaned thoroughly.  Let dry.   Apply patch as indicated in monitor instructions.  Patch will be place under collarbone on left side of chest with arrow pointing upward.   Rub patch adhesive wings for 2 minutes.Remove white label marked "1".  Remove white label marked "2".  Rub patch adhesive wings for 2 additional minutes.   While looking in a mirror, press and release button in center of patch.  A small green light will flash 3-4 times .  This will be your only indicator the monitor has been turned on.     Do not shower for the first 24 hours.  You may shower after the first 24 hours.   Press button if you feel a symptom. You will hear a small click.  Record Date, Time and Symptom in the Patient Log Book.   When you are ready to remove patch, follow instructions on last 2 pages of Patient Log Book.  Stick patch monitor onto last page of Patient Log Book.   Place Patient Log Book in [place] box.  Use locking tab on box and tape box closed securely.  The [place] [employer] has prepaid  postage on it.  Please place in mailbox as soon as possible.  Your physician should have your test results approximately 7 days after the monitor has been mailed back to [facility].   Call [place] at [phone] if you have questions regarding your ZIO XT patch monitor.  Call them immediately if you see an orange light blinking on your monitor.   If your monitor falls off in less than 4 days contact our Monitor department at [phone].  If your monitor becomes loose or falls off after 4 days call [employer] at [phone] for suggestions on securing your monitor.     Follow-Up: At [facility], you and your health needs are our priority.  As part of our continuing mission to provide you with exceptional heart care, we  have created designated Provider Care Teams.  These Care Teams include your primary Cardiologist (physician) and Advanced Practice Providers (APPs -  Physician Assistants and Nurse Practitioners) who all work together to provide you with the care you need, when you need it.  We recommend signing up for the patient portal called "MyChart".  Sign up information is provided on this After Visit Summary.  MyChart is used to connect with patients for Virtual Visits (Telemedicine).  Patients are able to view lab/test results, encounter notes, upcoming appointments, etc.  Non-urgent messages can be sent to your provider as well.   To learn more about what you can do with MyChart, go to [email or web address].    Your next appointment:   3 month(s)  The format for your next appointment:   In Person  Provider:   Eleonore Chiquito, MD   Other Instructions  Your cardiac CT will be scheduled at one of the below locations:   [facility] [address] [place], [place] [address] [phone]  [place] [address] [place], [place] [address] [phone]  If scheduled at [facility], please arrive at the [facility] main entrance of [facility] 30 minutes prior to test start time. Proceed to the [facility] Radiology Department (first floor) to check-in and test prep.  If scheduled at [facility], please arrive 15 mins early for check-in and test prep.  Please follow these instructions carefully (unless otherwise directed):  Hold all erectile dysfunction medications at least 3 days (72 hrs) prior to test.  On the Night Before the Test: . Be sure to Drink plenty of water. . Do not consume any caffeinated/decaffeinated beverages or chocolate 12 hours prior to your test. . Do not take any antihistamines 12 hours prior to your test.  On the Day of the Test: . Drink plenty of water. Do  not drink any water within one hour of the test. . Do not eat any food 4 hours prior to the test. . You may take your regular medications prior to the test.  . Take metoprolol (Lopressor) two hours prior to test. . HOLD Furosemide/Hydrochlorothiazide morning of the test.      After the Test: . Drink plenty of water. . After receiving IV contrast, you may experience a mild flushed feeling. This is normal. . On occasion, you may experience a mild rash up to 24 hours after the test. This is not dangerous. If this occurs, you can take Benadryl 25 mg and increase your fluid intake. . If you experience trouble breathing, this can be serious. If it is severe call 911 IMMEDIATELY. If it is  mild, please call our office. . If you take any of these medications: Glipizide/Metformin, Avandament, Glucavance, please do not take 48 hours after completing test unless otherwise instructed.   Once we have confirmed authorization from your insurance company, we will call you to set up a date and time for your test. Based on how quickly your insurance processes prior authorizations requests, please allow up to 4 weeks to be contacted for scheduling your Cardiac CT appointment. Be advised that routine Cardiac CT appointments could be scheduled as many as 8 weeks after your provider has ordered it.  For non-scheduling related questions, please contact the cardiac imaging nurse navigator should you have any questions/concerns: Marchia Bond, Cardiac Imaging Nurse Navigator Burley Saver, Interim Cardiac Imaging Nurse [place] [facility] Direct Office Dial: [phone]   For scheduling needs, including cancellations and rescheduling, please call [place], [phone].       Signed, Addison Naegeli. Audie Box, [place]  [address] [place], [place] [address] [phone]  04/08/2020 11:42 AM

## 2020-04-08 ENCOUNTER — Other Ambulatory Visit: Payer: Self-pay | Admitting: Cardiovascular Disease

## 2020-04-08 ENCOUNTER — Other Ambulatory Visit: Payer: Self-pay

## 2020-04-08 ENCOUNTER — Ambulatory Visit (INDEPENDENT_AMBULATORY_CARE_PROVIDER_SITE_OTHER): Payer: 59

## 2020-04-08 ENCOUNTER — Encounter: Payer: Self-pay | Admitting: Radiology

## 2020-04-08 ENCOUNTER — Encounter: Payer: Self-pay | Admitting: Cardiovascular Disease

## 2020-04-08 ENCOUNTER — Ambulatory Visit: Payer: 59 | Admitting: Cardiovascular Disease

## 2020-04-08 VITALS — BP 152/90 | HR 65 | Ht 72.0 in | Wt 293.4 lb

## 2020-04-08 DIAGNOSIS — E669 Obesity, unspecified: Secondary | ICD-10-CM

## 2020-04-08 DIAGNOSIS — R002 Palpitations: Secondary | ICD-10-CM | POA: Diagnosis not present

## 2020-04-08 DIAGNOSIS — I10 Essential (primary) hypertension: Secondary | ICD-10-CM

## 2020-04-08 DIAGNOSIS — R079 Chest pain, unspecified: Secondary | ICD-10-CM | POA: Diagnosis not present

## 2020-04-08 DIAGNOSIS — R0602 Shortness of breath: Secondary | ICD-10-CM

## 2020-04-08 MED ORDER — HYDROCHLOROTHIAZIDE 25 MG PO TABS: 25.0000 mg | ORAL_TABLET | Freq: Every day | ORAL | 3 refills | Status: DC

## 2020-04-08 MED ORDER — METOPROLOL TARTRATE 50 MG PO TABS: ORAL_TABLET | ORAL | 0 refills | Status: DC

## 2020-04-08 MED FILL — METOPROLOL TARTRATE 50 MG T: 50 | 1 days supply | Qty: 1 | Fill #0

## 2020-04-08 MED FILL — HYDROCHLOROTHIAZIDE 25 MG T: 25 | 90 days supply | Qty: 90 | Fill #0

## 2020-04-08 NOTE — Progress Notes (Signed)
Enrolled patient for a 7 day Zio XT monitor to be mailed to patients home.  

## 2020-04-08 NOTE — Patient Instructions (Addendum)
Medication Instructions:  Take Metoprolol 50 mg two hours before your CT when scheduled. Start HCTZ 25 mg daily  *If you need a refill on your cardiac medications before your next appointment, please call your pharmacy*   Lab Work: BMET, TSH, CBC, BNP  If you have labs (blood work) drawn today and your tests are completely normal, you will receive your results only by: Marland Kitchen MyChart Message (if you have MyChart) OR . A paper copy in the mail If you have any lab test that is abnormal or we need to change your treatment, we will call you to review the results.   Testing/Procedures:  Echocardiogram - Your physician has requested that you have an echocardiogram. Echocardiography is a painless test that uses sound waves to create images of your heart. It provides your doctor with information about the size and shape of your heart and how well your heart's chambers and valves are working. This procedure takes approximately one hour. There are no restrictions for this procedure. This will be performed at our [facility] location - [address].  Your physician has requested that you have cardiac CT. Cardiac computed tomography (CT) is a painless test that uses an x-ray machine to take clear, detailed pictures of your heart. For further information please visit [email or web address]. Please follow instruction sheet as given.   ZIO XT- Long Term Monitor Instructions   Your physician has requested you wear your ZIO patch monitor____7___days.   This is a single patch monitor.  [employer] supplies one patch monitor per enrollment.  Additional stickers are not available.   Please do not apply patch if you will be having a Nuclear Stress Test, Echocardiogram, Cardiac CT, MRI, or Chest Xray during the time frame you would be wearing the monitor. The patch cannot be worn during these tests.  You cannot remove and re-apply the ZIO XT patch monitor.   Your ZIO patch monitor will be sent USPS  Priority mail from [facility] directly to your home address. The monitor may also be mailed to a PO BOX if home delivery is not available.   It may take 3-5 days to receive your monitor after you have been enrolled.   Once you have received you monitor, please review enclosed instructions.  Your monitor has already been registered assigning a specific monitor serial # to you.   Applying the monitor   Shave hair from upper left chest.   Hold abrader disc by orange tab.  Rub abrader in 40 strokes over left upper chest as indicated in your monitor instructions.   Clean area with 4 enclosed alcohol pads .  Use all pads to assure are is cleaned thoroughly.  Let dry.   Apply patch as indicated in monitor instructions.  Patch will be place under collarbone on left side of chest with arrow pointing upward.   Rub patch adhesive wings for 2 minutes.Remove white label marked "1".  Remove white label marked "2".  Rub patch adhesive wings for 2 additional minutes.   While looking in a mirror, press and release button in center of patch.  A small green light will flash 3-4 times .  This will be your only indicator the monitor has been turned on.     Do not shower for the first 24 hours.  You may shower after the first 24 hours.   Press button if you feel a symptom. You will hear a small click.  Record Date, Time and Symptom in  the Patient Log Book.   When you are ready to remove patch, follow instructions on last 2 pages of Patient Log Book.  Stick patch monitor onto last page of Patient Log Book.   Place Patient Log Book in [place] box.  Use locking tab on box and tape box closed securely.  The [employer] has [employer] on it.  Please place in mailbox as soon as possible.  Your physician should have your test results approximately 7 days after the monitor has been mailed back to [facility].   Call [place] at [phone] if you have questions regarding  your ZIO XT patch monitor.  Call them immediately if you see an orange light blinking on your monitor.   If your monitor falls off in less than 4 days contact our Monitor department at [phone].  If your monitor becomes loose or falls off after 4 days call [employer] at [phone] for suggestions on securing your monitor.     Follow-Up: At [facility], you and your health needs are our priority.  As part of our continuing mission to provide you with exceptional heart care, we have created designated Provider Care Teams.  These Care Teams include your primary Cardiologist (physician) and Advanced Practice Providers (APPs -  Physician Assistants and Nurse Practitioners) who all work together to provide you with the care you need, when you need it.  We recommend signing up for the patient portal called "MyChart".  Sign up information is provided on this After Visit Summary.  MyChart is used to connect with patients for Virtual Visits (Telemedicine).  Patients are able to view lab/test results, encounter notes, upcoming appointments, etc.  Non-urgent messages can be sent to your provider as well.   To learn more about what you can do with MyChart, go to [email or web address].    Your next appointment:   3 month(s)  The format for your next appointment:   In Person  Provider:   Eleonore Chiquito, MD   Other Instructions  Your cardiac CT will be scheduled at one of the below locations:   [facility] [address] [place], [place] [address] [phone]  [facility] [address] [place], [place] [address] [phone]  If scheduled at [facility], please arrive at the [facility] main entrance of [facility] 30 minutes prior to test start time. Proceed to the [facility] Radiology Department (first floor) to check-in and test prep.  If scheduled at [facility], please  arrive 15 mins early for check-in and test prep.  Please follow these instructions carefully (unless otherwise directed):  Hold all erectile dysfunction medications at least 3 days (72 hrs) prior to test.  On the Night Before the Test: . Be sure to Drink plenty of water. . Do not consume any caffeinated/decaffeinated beverages or chocolate 12 hours prior to your test. . Do not take any antihistamines 12 hours prior to your test.  On the Day of the Test: . Drink plenty of water. Do not drink any water within one hour of the test. . Do not eat any food 4 hours prior to the test. . You may take your regular medications prior to the test.  . Take metoprolol (Lopressor) two hours prior to test. . HOLD Furosemide/Hydrochlorothiazide morning of the test.      After the Test: . Drink plenty of water. . After receiving IV contrast, you may  experience a mild flushed feeling. This is normal. . On occasion, you may experience a mild rash up to 24 hours after the test. This is not dangerous. If this occurs, you can take Benadryl 25 mg and increase your fluid intake. . If you experience trouble breathing, this can be serious. If it is severe call 911 IMMEDIATELY. If it is mild, please call our office. . If you take any of these medications: Glipizide/Metformin, Avandament, Glucavance, please do not take 48 hours after completing test unless otherwise instructed.   Once we have confirmed authorization from your insurance company, we will call you to set up a date and time for your test. Based on how quickly your insurance processes prior authorizations requests, please allow up to 4 weeks to be contacted for scheduling your Cardiac CT appointment. Be advised that routine Cardiac CT appointments could be scheduled as many as 8 weeks after your provider has ordered it.  For non-scheduling related questions, please contact the cardiac imaging nurse navigator should you have any questions/concerns: Marchia Bond, Cardiac Imaging Nurse Navigator Burley Saver, Interim Cardiac Imaging Nurse [facility] Direct Office Dial: [phone]   For scheduling needs, including cancellations and rescheduling, please call [place], [phone].

## 2020-04-09 LAB — BASIC METABOLIC PANEL
BUN/Creatinine Ratio: 18 (ref 9–20)
BUN: 17 mg/dL (ref 6–24)
CO2: 22 mmol/L (ref 20–29)
Calcium: 9.8 mg/dL (ref 8.7–10.2)
Chloride: 103 mmol/L (ref 96–106)
Creatinine, Ser: 0.95 mg/dL (ref 0.76–1.27)
GFR calc Af Amer: 107 mL/min/{1.73_m2} (ref >59)
GFR calc non Af Amer: 92 mL/min/{1.73_m2} (ref >59)
Glucose: 89 mg/dL (ref 65–99)
Potassium: 4.3 mmol/L (ref 3.5–5.2)
Sodium: 140 mmol/L (ref 134–144)

## 2020-04-09 LAB — CBC
Hematocrit: 46.2 % (ref 37.5–51.0)
Hemoglobin: 15.5 g/dL (ref 13.0–17.7)
MCH: 28.3 pg (ref 26.6–33.0)
MCHC: 33.5 g/dL (ref 31.5–35.7)
MCV: 84 fL (ref 79–97)
Platelets: 227 10*3/uL (ref 150–450)
RBC: 5.48 x10E6/uL (ref 4.14–5.80)
RDW: 13.2 % (ref 11.6–15.4)
WBC: 6.7 10*3/uL (ref 3.4–10.8)

## 2020-04-09 LAB — BRAIN NATRIURETIC PEPTIDE: BNP: 10.8 pg/mL (ref 0.0–100.0)

## 2020-04-09 LAB — TSH: TSH: 1.2 u[IU]/mL (ref 0.450–4.500)

## 2020-04-17 ENCOUNTER — Telehealth (HOSPITAL_COMMUNITY): Payer: Self-pay | Admitting: Emergency Medicine

## 2020-04-20 ENCOUNTER — Other Ambulatory Visit: Payer: Self-pay | Admitting: Family Medicine

## 2020-04-20 ENCOUNTER — Telehealth (HOSPITAL_COMMUNITY): Payer: Self-pay | Admitting: Emergency Medicine

## 2020-04-20 NOTE — Telephone Encounter (Signed)
Attempted to call patient regarding upcoming cardiac CT appointment. °Left message on voicemail with name and callback number °Havyn Ramo RN Navigator Cardiac Imaging °[facility] °[phone] Office °[phone] Cell ° °

## 2020-04-20 NOTE — Progress Notes (Signed)
error 

## 2020-04-20 NOTE — Telephone Encounter (Signed)
Pt returning phone call regarding upcoming cardiac imaging study; pt verbalizes understanding of appt date/time, parking situation and where to check in, pre-test NPO status and medications ordered, and verified current allergies; name and call back number provided for further questions should they arise Anyelin Mogle RN Navigator Cardiac Imaging [facility] [phone] office [phone] cell   

## 2020-04-22 ENCOUNTER — Ambulatory Visit (HOSPITAL_COMMUNITY)
Admission: RE | Admit: 2020-04-22 | Discharge: 2020-04-22 | Disposition: A | Discharge status: Home / Self Care | Payer: 59 | Source: Ambulatory Visit | Attending: Cardiovascular Disease | Admitting: Cardiovascular Disease

## 2020-04-22 ENCOUNTER — Encounter: Payer: 59 | Admitting: *Deleted

## 2020-04-22 ENCOUNTER — Other Ambulatory Visit: Payer: Self-pay

## 2020-04-22 ENCOUNTER — Other Ambulatory Visit: Payer: Self-pay | Admitting: Family Medicine

## 2020-04-22 DIAGNOSIS — R079 Chest pain, unspecified: Secondary | ICD-10-CM | POA: Diagnosis not present

## 2020-04-22 DIAGNOSIS — Z006 Encounter for examination for normal comparison and control in clinical research program: Secondary | ICD-10-CM

## 2020-04-22 MED ORDER — NITROGLYCERIN 0.4 MG SL SUBL
SUBLINGUAL_TABLET | SUBLINGUAL | Status: AC
  Filled 2020-04-22: qty 2

## 2020-04-22 MED ORDER — IOHEXOL 350 MG/ML SOLN
80.0000 mL | Freq: Once | INTRAVENOUS | Status: AC | PRN
  Administered 2020-04-22: 80 mL via INTRAVENOUS

## 2020-04-22 MED ORDER — NITROGLYCERIN 0.4 MG SL SUBL
0.8000 mg | SUBLINGUAL_TABLET | Freq: Once | SUBLINGUAL | Status: AC
  Administered 2020-04-22: 0.8 mg via SUBLINGUAL

## 2020-04-22 MED FILL — AMLODIPINE BESYLATE 10 MG T: 10 | 90 days supply | Qty: 90 | Fill #0

## 2020-04-22 NOTE — Research (Signed)
Subject Name: Brady [name]  Subject met inclusion and exclusion criteria.  The informed consent form, study requirements and expectations were reviewed with the subject and questions and concerns were addressed prior to the signing of the consent form.  The subject verbalized understanding of the trial requirements.  The subject agreed to participate in the [facility] trial and signed the informed consent at 0730 on 04/22/20  The informed consent was obtained prior to performance of any protocol-specific procedures for the subject.  A copy of the signed informed consent was given to the subject and a copy was placed in the subject's medical record.   Timoteo Gaul

## 2020-04-29 DIAGNOSIS — R002 Palpitations: Secondary | ICD-10-CM | POA: Diagnosis not present

## 2020-05-05 ENCOUNTER — Other Ambulatory Visit: Payer: Self-pay

## 2020-05-05 ENCOUNTER — Ambulatory Visit (HOSPITAL_COMMUNITY): Payer: 59 | Attending: Cardiovascular Disease

## 2020-05-05 DIAGNOSIS — R002 Palpitations: Secondary | ICD-10-CM | POA: Diagnosis not present

## 2020-05-05 LAB — ECHOCARDIOGRAM COMPLETE
Area-P 1/2: 2.76 cm2
S' Lateral: 2.7 cm

## 2020-05-05 MED ORDER — PERFLUTREN LIPID MICROSPHERE
1.0000 mL | INTRAVENOUS | Status: AC | PRN
  Administered 2020-05-05: 2 mL via INTRAVENOUS

## 2020-05-21 ENCOUNTER — Other Ambulatory Visit (HOSPITAL_BASED_OUTPATIENT_CLINIC_OR_DEPARTMENT_OTHER): Payer: Self-pay

## 2020-06-12 ENCOUNTER — Telehealth: Payer: 59 | Admitting: Emergency Medicine

## 2020-06-12 DIAGNOSIS — J329 Chronic sinusitis, unspecified: Secondary | ICD-10-CM

## 2020-06-12 MED ORDER — DOXYCYCLINE HYCLATE 100 MG PO CAPS
100.0000 mg | ORAL_CAPSULE | Freq: Two times a day (BID) | ORAL | 0 refills | Status: DC
Start: 2020-06-12 — End: 2020-06-30

## 2020-06-12 NOTE — Progress Notes (Signed)
We are sorry that you are not feeling well.  Here is how we plan to help!  Based on what you have shared with me it looks like you have sinusitis.  Sinusitis is inflammation and infection in the sinus cavities of the head.  Based on your presentation I believe you most likely have Acute Bacterial Sinusitis.  This is an infection caused by bacteria and is treated with antibiotics. I have prescribed Doxycycline 100mg by mouth twice a day for 10 days. You may use an oral decongestant such as Mucinex D or if you have glaucoma or high blood pressure use plain Mucinex. Saline nasal spray help and can safely be used as often as needed for congestion.  If you develop worsening sinus pain, fever or notice severe headache and vision changes, or if symptoms are not better after completion of antibiotic, please schedule an appointment with a health care provider.    Sinus infections are not as easily transmitted as other respiratory infection, however we still recommend that you avoid close contact with loved ones, especially the very young and elderly.  Remember to wash your hands thoroughly throughout the day as this is the number one way to prevent the spread of infection!  Home Care:  Only take medications as instructed by your medical team.  Complete the entire course of an antibiotic.  Do not take these medications with alcohol.  A steam or ultrasonic humidifier can help congestion.  You can place a towel over your head and breathe in the steam from hot water coming from a faucet.  Avoid close contacts especially the very young and the elderly.  Cover your mouth when you cough or sneeze.  Always remember to wash your hands.  Get Help Right Away If:  You develop worsening fever or sinus pain.  You develop a severe head ache or visual changes.  Your symptoms persist after you have completed your treatment plan.  Make sure you  Understand these instructions.  Will watch your  condition.  Will get help right away if you are not doing well or get worse.  Your e-visit answers were reviewed by a board certified advanced clinical practitioner to complete your personal care plan.  Depending on the condition, your plan could have included both over the counter or prescription medications.  If there is a problem please reply  once you have received a response from your provider.  Your safety is important to us.  If you have drug allergies check your prescription carefully.    You can use MyChart to ask questions about today's visit, request a non-urgent call back, or ask for a work or school excuse for 24 hours related to this e-Visit. If it has been greater than 24 hours you will need to follow up with your provider, or enter a new e-Visit to address those concerns.  You will get an e-mail in the next two days asking about your experience.  I hope that your e-visit has been valuable and will speed your recovery. Thank you for using e-visits.   Approximately 5 minutes was used in reviewing the patient's chart, questionnaire, prescribing medications, and documentation.  

## 2020-06-29 NOTE — Progress Notes (Signed)
Cardiology Office Note:   Date:  06/30/2020  NAME:  Brady [name]    MRN: [id number] DOB:  November 15, 1970   PCP:  Erven Colla, DO  Cardiologist:  None  Electrophysiologist:  None   Referring MD: Erven Colla, DO   Chief Complaint  Patient presents with  . Follow-up   History of Present Illness:   Brady [name] is a 50 y.o. male with a hx of obesity, GERD who presents for follow-up.  Evaluated in February 2022 for shortness of breath.  Echocardiogram was normal.  BNP normal.  Monitor normal.  Coronary CTA normal.  Suspect symptoms are related to obesity and weight gain. All this testing was normal.  He reports his symptoms of shortness of breath have improved with losing weight.  He is lost roughly 14 pounds since her last visit.  He denies any exertional chest pain or palpitations.  He overall is doing well.  He is very relieved to know he has no significant heart blockages.  We did go over the results of this testing.  Coronary calcium score of 0 with no significant blockages on coronary CTA.  Echocardiogram normal.  Monitor normal.  I suspect his symptoms are related to weight gain during the coronavirus pandemic.  He has plans to exercise and diet more.  Blood pressure also much improved on amlodipine.  Past Medical History: Past Medical History:  Diagnosis Date  . Allergic rhinitis   . Anxiety   . Chronic pansinusitis   . GERD (gastroesophageal reflux disease)   . Hypertension   . IBS (irritable bowel syndrome)   . Mitral prolapse   . Reflux     Past Surgical History: Past Surgical History:  Procedure Laterality Date  . CARPAL TUNNEL RELEASE    . COLONOSCOPY    . COLONOSCOPY N/A 10/13/2014   Procedure: COLONOSCOPY;  Surgeon: Danie Binder, MD;  Location[facility];  Service: Endoscopy;  Laterality: N/A;  0830   . FRONTAL SINUS EXPLORATION Bilateral 01/30/2015   Procedure: FRONTAL SINUS EXPLORATION;  Surgeon: Izora Gala, MD;  Location: [facility];  Service: ENT;  Laterality: Bilateral;  . LATERAL EPICONDYLE RELEASE  07/08/2011   Procedure: TENNIS ELBOW RELEASE;  Surgeon: Carole Civil, MD;  Location: [facility];  Service: Orthopedics;  Laterality: Left;  Left Tennis Elbow Release  . NASAL SINUS SURGERY Bilateral 01/30/2015   Procedure:  BILATERAL ENDOSCOPIC MAXILLARY, ETHMOID AND FRONTAL SINUS SURGERY;  Surgeon: Izora Gala, MD;  Location: [facility];  Service: ENT;  Laterality: Bilateral;  . TENNIS ELBOW RELEASE/NIRSCHEL PROCEDURE Right 08/08/2014   Procedure: RIGHT TENNIS ELBOW RELEASE;  Surgeon: Carole Civil, MD;  Location: [facility];  Service: Orthopedics;  Laterality: Right;  . TONSILLECTOMY    . WISDOM TOOTH EXTRACTION      Current Medications: Current Meds  Medication Sig  . amLODipine (NORVASC) 10 MG tablet TAKE 1 TABLET BY MOUTH ONCE A DAY  . cholecalciferol (VITAMIN D) 1000 UNITS tablet Take 1,000 Units by mouth daily.  Marland Kitchen ECHINACEA PO Take 1 tablet by mouth daily.  . fexofenadine (ALLEGRA) 180 MG tablet Take 180 mg by mouth daily.  . Flaxseed, Linseed, (FLAX SEED OIL PO) Take 1 capsule by mouth daily.  . Ginger, Zingiber officinalis, (GINGER PO) Take 1 capsule by mouth daily.  . Glucosamine HCl (GLUCOSAMINE PO) Take 2 tablets by mouth daily.   . hydrochlorothiazide (HYDRODIURIL) 25 MG tablet TAKE 1 TABLET BY MOUTH DAILY  .  KRILL OIL PO Take 1 capsule by mouth daily.  . Multiple Vitamin (MULTIVITAMIN WITH MINERALS) TABS tablet Take 1 tablet by mouth daily.  Marland Kitchen POTASSIUM PO Take 1 tablet by mouth daily.   . TURMERIC PO Take 1 tablet by mouth daily.  . vitamin C (ASCORBIC ACID) 500 MG tablet Take 500 mg by mouth daily.  . [DISCONTINUED] doxycycline (VIBRAMYCIN) 100 MG capsule Take 1 capsule (100 mg total) by mouth 2 (two) times daily.  . [DISCONTINUED] metoprolol tartrate (LOPRESSOR) 50 MG tablet TAKE 1 TABLET BY MOUTH ONCE FOR PROCEDURE.     Allergies:    Penicillins   Social  History: Social History   Socioeconomic History  . Marital status: Married    Spouse name: Not on file  . Number of children: 1  . Years of education: college  . Highest education level: Not on file  Occupational History  . Occupation: computer IT  Tobacco Use  . Smoking status: Never Smoker  . Smokeless tobacco: Never Used  . Tobacco comment: Never smoked  Substance and Sexual Activity  . Alcohol use: Yes    Alcohol/week: 0.0 standard drinks    Comment: one beer a month  . Drug use: No  . Sexual activity: Not on file  Other Topics Concern  . Not on file  Social History Narrative  . Not on file   Social Determinants of Health   Financial Resource Strain: Not on file  Food Insecurity: Not on file  Transportation Needs: Not on file  Physical Activity: Not on file  Stress: Not on file  Social Connections: Not on file     Family History: The patient's family history includes Diabetes in his father and another family member; Heart attack in his maternal grandfather; Hypertension in his father and mother.  ROS:   All other ROS reviewed and negative. Pertinent positives noted in the HPI.     EKGs/Labs/Other Studies Reviewed:   The following studies were personally reviewed by me today:  TTE 05/05/2020 1. Left ventricular ejection fraction, by estimation, is 55 to 60%. The  left ventricle has normal function. The left ventricle has no regional  wall motion abnormalities. There is mild concentric left ventricular  hypertrophy. Left ventricular diastolic  parameters were normal.  2. Right ventricular systolic function is normal. The right ventricular  size is normal. Tricuspid regurgitation signal is inadequate for assessing  PA pressure.  3. The mitral valve is grossly normal. No evidence of mitral valve  regurgitation. No evidence of mitral stenosis.  4. The aortic valve is tricuspid. Aortic valve regurgitation is not  visualized. No aortic stenosis is present.   5. The inferior vena cava is normal in size with greater than 50%  respiratory variability, suggesting right atrial pressure of 3 mmHg.  CCTA 04/22/2020 1. Coronary calcium score of 0. 2. Normal coronary origin with right dominance. 3. Normal coronary arteries. 4. Mid LAD myocardial bridge (normal variant).  Zio 05/06/2020 Impression: 1. No arrhythmias detected.  2. Rare ectopy.    Recent Labs: 07/09/2019: ALT 25 04/08/2020: BNP 10.8; BUN 17; Creatinine, Ser 0.95; Hemoglobin 15.5; Platelets 227; Potassium 4.3; Sodium 140; TSH 1.200   Recent Lipid Panel    Component Value Date/Time   CHOL 187 07/09/2019 0942   TRIG 163 (H) 07/09/2019 0942   HDL 42 07/09/2019 0942   CHOLHDL 4.5 07/09/2019 0942   LDLCALC 116 (H) 07/09/2019 0942    Physical Exam:   VS:  BP 124/82   Pulse 60  Ht 6\' 2"  (1.88 m)   Wt 279 lb 12.8 oz (126.9 kg)   SpO2 97%   BMI 35.92 kg/m    Wt Readings from Last 3 Encounters:  06/30/20 279 lb 12.8 oz (126.9 kg)  04/08/20 293 lb 6.4 oz (133.1 kg)  01/07/20 286 lb (129.7 kg)    General: Well nourished, well developed, in no acute distress Head: Atraumatic, normal size  Eyes: PEERLA, EOMI  Neck: Supple, no JVD Endocrine: No thryomegaly Cardiac: Normal S1, S2; RRR; no murmurs, rubs, or gallops Lungs: Clear to auscultation bilaterally, no wheezing, rhonchi or rales  Abd: Soft, nontender, no hepatomegaly  Ext: No edema, pulses 2+ Musculoskeletal: No deformities, BUE and BLE strength normal and equal Skin: Warm and dry, no rashes   Neuro: Alert and oriented to person, place, time, and situation, CNII-XII grossly intact, no focal deficits  Psych: Normal mood and affect   ASSESSMENT:   TOBIE HELLEN is a 50 y.o. male who presents for the following: 1. SOB (shortness of breath) on exertion   2. Primary hypertension     PLAN:   1. SOB (shortness of breath) on exertion -Normal echo.  Normal coronary CTA.  0 coronary calcium.  Normal coronary arteries.   Monitor normal.  BNP normal.  Cardiovascular examination normal.  It is safe to say his symptoms and not cardiac.  He has no history of smoking.  I do not think he has a lung issue.  I think his symptoms are related to deconditioning and weight gain during the coronavirus pandemic.  I have recommended regular exercise as well as diet.  He can continue to work on this.  He will see [place] as needed.  2. Primary hypertension -BP well controlled on amlodipine.  No changes to medications.  Disposition: Return if symptoms worsen or fail to improve.  Medication Adjustments/Labs and Tests Ordered: Current medicines are reviewed at length with the patient today.  Concerns regarding medicines are outlined above.  No orders of the defined types were placed in this encounter.  No orders of the defined types were placed in this encounter.   Patient Instructions  Medication Instructions:  The current medical regimen is effective;  continue present plan and medications.  *If you need a refill on your cardiac medications before your next appointment, please call your pharmacy*   Follow-Up: At [facility], you and your health needs are our priority.  As part of our continuing mission to provide you with exceptional heart care, we have created designated Provider Care Teams.  These Care Teams include your primary Cardiologist (physician) and Advanced Practice Providers (APPs -  Physician Assistants and Nurse Practitioners) who all work together to provide you with the care you need, when you need it.  We recommend signing up for the patient portal called "MyChart".  Sign up information is provided on this After Visit Summary.  MyChart is used to connect with patients for Virtual Visits (Telemedicine).  Patients are able to view lab/test results, encounter notes, upcoming appointments, etc.  Non-urgent messages can be sent to your provider as well.   To learn more about what you can do with MyChart, go to  [email or web address].    Your next appointment:   As needed  The format for your next appointment:   In Person  Provider:   Eleonore Chiquito, MD       Time Spent with Patient: I have spent a total of 25 minutes with patient reviewing hospital notes, telemetry, EKGs,  labs and examining the patient as well as establishing an assessment and plan that was discussed with the patient.  > 50% of time was spent in direct patient care.  Signed, Addison Naegeli. Audie Box, MD, [place]  [address] [place],  [address] [phone]  06/30/2020 10:50 AM

## 2020-06-30 ENCOUNTER — Other Ambulatory Visit: Payer: Self-pay

## 2020-06-30 ENCOUNTER — Ambulatory Visit: Payer: 59 | Admitting: Cardiovascular Disease

## 2020-06-30 ENCOUNTER — Encounter: Payer: Self-pay | Admitting: Cardiovascular Disease

## 2020-06-30 VITALS — BP 124/82 | HR 60 | Ht 74.0 in | Wt 279.8 lb

## 2020-06-30 DIAGNOSIS — I10 Essential (primary) hypertension: Secondary | ICD-10-CM

## 2020-06-30 DIAGNOSIS — R0602 Shortness of breath: Secondary | ICD-10-CM

## 2020-06-30 NOTE — Patient Instructions (Signed)
Medication Instructions:  The current medical regimen is effective;  continue present plan and medications.  *If you need a refill on your cardiac medications before your next appointment, please call your pharmacy*    Follow-Up: At [facility], you and your health needs are our priority.  As part of our continuing mission to provide you with exceptional heart care, we have created designated Provider Care Teams.  These Care Teams include your primary Cardiologist (physician) and Advanced Practice Providers (APPs -  Physician Assistants and Nurse Practitioners) who all work together to provide you with the care you need, when you need it.  We recommend signing up for the patient portal called "MyChart".  Sign up information is provided on this After Visit Summary.  MyChart is used to connect with patients for Virtual Visits (Telemedicine).  Patients are able to view lab/test results, encounter notes, upcoming appointments, etc.  Non-urgent messages can be sent to your provider as well.   To learn more about what you can do with MyChart, go to [email or web address].com.    Your next appointment:   As needed  The format for your next appointment:   In Person  Provider:   Geneva O'Neal, MD      

## 2020-07-01 ENCOUNTER — Other Ambulatory Visit (HOSPITAL_COMMUNITY): Payer: Self-pay

## 2020-07-01 MED FILL — Hydrochlorothiazide Tab 25 MG: ORAL | 90 days supply | Qty: 90 | Fill #0 | Status: AC

## 2020-07-21 ENCOUNTER — Other Ambulatory Visit: Payer: Self-pay | Admitting: Family Medicine

## 2020-07-21 ENCOUNTER — Other Ambulatory Visit (HOSPITAL_COMMUNITY): Payer: Self-pay

## 2020-07-21 MED ORDER — AMLODIPINE BESYLATE 10 MG PO TABS
ORAL_TABLET | Freq: Every day | ORAL | 0 refills | Status: DC
  Filled 2020-07-21: qty 90, 90d supply, fill #0

## 2020-07-21 NOTE — Telephone Encounter (Signed)
Sent mychart message

## 2020-07-21 NOTE — Telephone Encounter (Signed)
F/u in  60-90 days for bp recheck.   Thx. Dr. Lovena Le

## 2020-07-21 NOTE — Telephone Encounter (Signed)
Please contact patient to have him follow up in 60-90 days for blood pressure. Thank you!

## 2020-07-21 NOTE — Telephone Encounter (Signed)
No results found for: HGBA1C  Lab Results  Component Value Date   CREATININE 0.95 04/08/2020     Lab Results  Component Value Date   CHOL 187 07/09/2019   HDL 42 07/09/2019   LDLCALC 116 (H) 07/09/2019   TRIG 163 (H) 07/09/2019   CHOLHDL 4.5 07/09/2019     BP Readings from Last 3 Encounters:  06/30/20 124/82  04/22/20 111/69  04/08/20 (!) 152/90

## 2020-08-10 ENCOUNTER — Telehealth: Payer: Self-pay

## 2020-08-10 DIAGNOSIS — Z006 Encounter for examination for normal comparison and control in clinical research program: Secondary | ICD-10-CM

## 2020-08-10 NOTE — Telephone Encounter (Signed)
I called patient for his 90-day Identify Study follow up phone call. Patient is doing well with no cardiac symptoms at this time. I reminded patient I would call him in February for his 1 year follow-up.

## 2020-10-05 ENCOUNTER — Other Ambulatory Visit: Payer: Self-pay

## 2020-10-05 ENCOUNTER — Other Ambulatory Visit (HOSPITAL_COMMUNITY): Payer: Self-pay

## 2020-10-05 ENCOUNTER — Telehealth (INDEPENDENT_AMBULATORY_CARE_PROVIDER_SITE_OTHER): Payer: 59 | Admitting: Family Medicine

## 2020-10-05 DIAGNOSIS — I10 Essential (primary) hypertension: Secondary | ICD-10-CM

## 2020-10-05 MED ORDER — AMLODIPINE BESYLATE 10 MG PO TABS
10.0000 mg | ORAL_TABLET | Freq: Every day | ORAL | 1 refills | Status: DC
  Filled 2020-10-05: qty 90, 90d supply, fill #0

## 2020-10-05 MED ORDER — HYDROCHLOROTHIAZIDE 25 MG PO TABS
25.0000 mg | ORAL_TABLET | Freq: Every day | ORAL | 1 refills | Status: DC
  Filled 2020-10-05: qty 90, 90d supply, fill #0

## 2020-10-05 NOTE — Progress Notes (Signed)
Patient ID: Brady [name], male    DOB: 12/13/1970, 50 y.o.   MRN: [id number]   Virtual Visit via Telephone Note  I connected with Brady [name] on 10/22/20 at  1:10 PM EDT by telephone and verified that I am speaking with the correct person using two identifiers.  Location: Patient: home Provider: office   I discussed the limitations, risks, security and privacy concerns of performing an evaluation and management service by telephone and the availability of in person appointments. I also discussed with the patient that there may be a patient responsible charge related to this service. The patient expressed understanding and agreed to proceed.   Chief Complaint  Patient presents with   Hypertension    Checked today 133/84 Need refills    Subjective:    HPI F/u htn- Taking meds daily. Pt checked it and was 135/83 Pt taking norvasc and hctz. Occ getting light headed with bending over.  Was doing some walking and trying to lose weight. Had chest pain in 2/22. And went to ER and had cardiac work up.  Went to cardiology in 2/22.  Had imaging calcium scoring- Heart score of 0. No further chest pains.  Medical History Brady [name] has a past medical history of Allergic rhinitis, Anxiety, Chronic pansinusitis, GERD (gastroesophageal reflux disease), Hypertension, IBS (irritable bowel syndrome), Mitral prolapse, and Reflux.   Outpatient Encounter Medications as of 10/05/2020  Medication Sig   [DISCONTINUED] amLODipine (NORVASC) 10 MG tablet TAKE 1 TABLET BY MOUTH ONCE A DAY   [DISCONTINUED] hydrochlorothiazide (HYDRODIURIL) 25 MG tablet TAKE 1 TABLET BY MOUTH DAILY   amLODipine (NORVASC) 10 MG tablet TAKE 1 TABLET BY MOUTH ONCE A DAY   cholecalciferol (VITAMIN D) 1000 UNITS tablet Take 1,000 Units by mouth daily.   ECHINACEA PO Take 1 tablet by mouth daily.   fexofenadine (ALLEGRA) 180 MG tablet Take 180 mg by mouth daily.   Flaxseed, Linseed, (FLAX SEED OIL PO) Take 1  capsule by mouth daily.   Ginger, Zingiber officinalis, (GINGER PO) Take 1 capsule by mouth daily.   Glucosamine HCl (GLUCOSAMINE PO) Take 2 tablets by mouth daily.    hydrochlorothiazide (HYDRODIURIL) 25 MG tablet TAKE 1 TABLET BY MOUTH DAILY   KRILL OIL PO Take 1 capsule by mouth daily.   Multiple Vitamin (MULTIVITAMIN WITH MINERALS) TABS tablet Take 1 tablet by mouth daily.   POTASSIUM PO Take 1 tablet by mouth daily.    TURMERIC PO Take 1 tablet by mouth daily.   vitamin C (ASCORBIC ACID) 500 MG tablet Take 500 mg by mouth daily.   No facility-administered encounter medications on file as of 10/05/2020.     Review of Systems  Constitutional:  Negative for chills and fever.  HENT:  Negative for congestion, rhinorrhea and sore throat.   Respiratory:  Negative for cough, shortness of breath and wheezing.   Cardiovascular:  Negative for chest pain and leg swelling.  Gastrointestinal:  Negative for abdominal pain, diarrhea, nausea and vomiting.  Genitourinary:  Negative for dysuria and frequency.  Skin:  Negative for rash.  Neurological:  Negative for dizziness, weakness and headaches.    Vitals There were no vitals taken for this visit.  Objective:   Physical Exam No PE due to phone visit.  Assessment and Plan   1. Essential hypertension - amLODipine (NORVASC) 10 MG tablet; TAKE 1 TABLET BY MOUTH ONCE A DAY  Dispense: 90 tablet; Refill: 1 - hydrochlorothiazide (HYDRODIURIL) 25 MG tablet; TAKE 1 TABLET BY MOUTH  DAILY  Dispense: 90 tablet; Refill: 1   Pt is due on next visit- for labs. Pt stable. Cont meds.   No follow-ups on file.    Follow Up Instructions:    I discussed the assessment and treatment plan with the patient. The patient was provided an opportunity to ask questions and all were answered. The patient agreed with the plan and demonstrated an understanding of the instructions.   The patient was advised to call back or seek an in-person evaluation if the  symptoms worsen or if the condition fails to improve as anticipated.  I provided 15 minutes of non-face-to-face time during this encounter.   [place], DO

## 2020-10-26 ENCOUNTER — Other Ambulatory Visit (HOSPITAL_COMMUNITY): Payer: Self-pay

## 2021-04-01 ENCOUNTER — Other Ambulatory Visit: Payer: Self-pay | Admitting: Family Medicine

## 2021-04-01 DIAGNOSIS — I10 Essential (primary) hypertension: Secondary | ICD-10-CM

## 2021-04-01 NOTE — Telephone Encounter (Signed)
Has appointment on 04/02/21 for medication followup

## 2021-04-01 NOTE — Telephone Encounter (Signed)
Sent mychart message

## 2021-04-02 ENCOUNTER — Other Ambulatory Visit: Payer: Self-pay

## 2021-04-02 ENCOUNTER — Ambulatory Visit: Payer: Managed Care, Other (non HMO) | Admitting: Family Medicine

## 2021-04-02 VITALS — BP 145/90 | HR 70 | Temp 98.3°F | Ht 74.0 in | Wt 297.0 lb

## 2021-04-02 DIAGNOSIS — Z130 Encounter for screening for diseases of the blood and blood-forming organs and certain disorders involving the immune mechanism: Secondary | ICD-10-CM | POA: Diagnosis not present

## 2021-04-02 DIAGNOSIS — E782 Mixed hyperlipidemia: Secondary | ICD-10-CM | POA: Diagnosis not present

## 2021-04-02 DIAGNOSIS — I10 Essential (primary) hypertension: Secondary | ICD-10-CM | POA: Diagnosis not present

## 2021-04-02 DIAGNOSIS — Z125 Encounter for screening for malignant neoplasm of prostate: Secondary | ICD-10-CM

## 2021-04-02 DIAGNOSIS — E669 Obesity, unspecified: Secondary | ICD-10-CM | POA: Insufficient documentation

## 2021-04-02 DIAGNOSIS — Z1211 Encounter for screening for malignant neoplasm of colon: Secondary | ICD-10-CM

## 2021-04-02 MED ORDER — HYDROCHLOROTHIAZIDE 25 MG PO TABS: 25.0000 mg | ORAL_TABLET | Freq: Every day | ORAL | 3 refills | Status: DC

## 2021-04-02 NOTE — Progress Notes (Signed)
Subjective:  Patient ID: Brady [name], male    DOB: 02/23/1971  Age: 51 y.o. MRN: [id number]  CC: Chief Complaint  Patient presents with   Establish Care   Hypertension    HPI:  51 year old male with hypertension and hyperlipidemia presents for follow-up.  Patient states that he is feeling well.  No chest pain or shortness of breath.  Needs medication refill.  Hypertension fairly well controlled.  Systolic BP slightly elevated today.  He is compliant with Norvasc and HCTZ.  Patient has underlying hyperlipidemia.  Last LDL was 116.  He is on no pharmacotherapy regarding this.  He does take [employer].  Patient has a longstanding family history of cancer.  He is overdue for colonoscopy.  Patient Active Problem List   Diagnosis Date Noted   Mixed hyperlipidemia 04/02/2021   Obesity (BMI 30-39.9) 04/02/2021   Essential hypertension 04/12/2018    Social Hx   Social History   Socioeconomic History   Marital status: Married    Spouse name: Not on file   Number of children: 1   Years of education: college   Highest education level: Not on file  Occupational History   Occupation: computer IT  Tobacco Use   Smoking status: Never   Smokeless tobacco: Never   Tobacco comments:    Never smoked  Substance and Sexual Activity   Alcohol use: Yes    Alcohol/week: 0.0 standard drinks    Comment: one beer a month   Drug use: No   Sexual activity: Not on file  Other Topics Concern   Not on file  Social History Narrative   Not on file   Social Determinants of Health   Financial Resource Strain: Not on file  Food Insecurity: Not on file  Transportation Needs: Not on file  Physical Activity: Not on file  Stress: Not on file  Social Connections: Not on file    Review of Systems Per HPI  Objective:  BP (!) 145/90    Pulse 70    Temp 98.3 F (36.8 C)    Ht '6\' 2"'  (1.88 m)    Wt 297 lb (134.7 kg)    SpO2 99%    BMI 38.13 kg/m   BP/Weight 04/02/2021 06/30/2020  0/86/5784  Systolic BP 696 295 284  Diastolic BP 90 82 69  Wt. (Lbs) 297 279.8 -  BMI 38.13 35.92 -  Some encounter information is confidential and restricted. Go to Review Flowsheets activity to see all data.    Physical Exam Vitals and nursing note reviewed.  Constitutional:      General: He is not in acute distress.    Appearance: Normal appearance. He is not ill-appearing.  HENT:     Head: Normocephalic and atraumatic.  Cardiovascular:     Rate and Rhythm: Normal rate and regular rhythm.  Pulmonary:     Effort: Pulmonary effort is normal.     Breath sounds: Normal breath sounds. No wheezing, rhonchi or rales.  Neurological:     Mental Status: He is alert.  Psychiatric:        Mood and Affect: Mood normal.        Behavior: Behavior normal.    Lab Results  Component Value Date   WBC 6.7 04/08/2020   HGB 15.5 04/08/2020   HCT 46.2 04/08/2020   PLT 227 04/08/2020   GLUCOSE 89 04/08/2020   CHOL 187 07/09/2019   TRIG 163 (H) 07/09/2019   HDL 42 07/09/2019   LDLCALC 116 (  H) 07/09/2019   ALT 25 07/09/2019   AST 23 07/09/2019   NA 140 04/08/2020   K 4.3 04/08/2020   CL 103 04/08/2020   CREATININE 0.95 04/08/2020   BUN 17 04/08/2020   CO2 22 04/08/2020   TSH 1.200 04/08/2020     Assessment & Plan:   Problem List Items Addressed This Visit       Cardiovascular and Mediastinum   Essential hypertension - Primary    Advised to keep a close eye. BP mildly elevated here but better controlled at home. Continue HCTZ and Norvasc.      Relevant Medications   hydrochlorothiazide (HYDRODIURIL) 25 MG tablet   Other Relevant Orders   CMP14+EGFR     Other   Mixed hyperlipidemia    Unsure of current control. Labs ordered.      Relevant Medications   hydrochlorothiazide (HYDRODIURIL) 25 MG tablet   Other Relevant Orders   Lipid panel   Obesity (BMI 30-39.9)   Other Visit Diagnoses     Prostate cancer screening       Relevant Orders   PSA   Screening,  anemia, deficiency, iron       Screening for deficiency anemia       Relevant Orders   CBC   Encounter for screening colonoscopy       Relevant Orders   Ambulatory referral to Gastroenterology       Meds ordered this encounter  Medications   hydrochlorothiazide (HYDRODIURIL) 25 MG tablet    Sig: Take 1 tablet (25 mg total) by mouth daily.    Dispense:  90 tablet    Refill:  3    Follow-up:  Return in about 6 months (around 09/30/2021).  [facility]

## 2021-04-02 NOTE — Assessment & Plan Note (Signed)
Advised to keep a close eye. BP mildly elevated here but better controlled at home. Continue HCTZ and Norvasc.

## 2021-04-02 NOTE — Patient Instructions (Signed)
Colonoscopy due. Will place referral.  Labs today.  Continue your meds.  Follow up in 6 months.  Take care  Dr. Lacinda Axon

## 2021-04-02 NOTE — Assessment & Plan Note (Signed)
Unsure of current control. Labs ordered.

## 2021-04-03 LAB — CBC
Hematocrit: 45.7 % (ref 37.5–51.0)
Hemoglobin: 15.7 g/dL (ref 13.0–17.7)
MCH: 28.2 pg (ref 26.6–33.0)
MCHC: 34.4 g/dL (ref 31.5–35.7)
MCV: 82 fL (ref 79–97)
Platelets: 256 10*3/uL (ref 150–450)
RBC: 5.57 x10E6/uL (ref 4.14–5.80)
RDW: 13.5 % (ref 11.6–15.4)
WBC: 7.7 10*3/uL (ref 3.4–10.8)

## 2021-04-03 LAB — CMP14+EGFR
ALT: 22 IU/L (ref 0–44)
AST: 22 IU/L (ref 0–40)
Albumin/Globulin Ratio: 1.7 (ref 1.2–2.2)
Albumin: 4.5 g/dL (ref 3.8–4.9)
Alkaline Phosphatase: 82 IU/L (ref 44–121)
BUN/Creatinine Ratio: 17 (ref 9–20)
BUN: 18 mg/dL (ref 6–24)
Bilirubin Total: 0.4 mg/dL (ref 0.0–1.2)
CO2: 25 mmol/L (ref 20–29)
Calcium: 10.2 mg/dL (ref 8.7–10.2)
Chloride: 100 mmol/L (ref 96–106)
Creatinine, Ser: 1.06 mg/dL (ref 0.76–1.27)
Globulin, Total: 2.6 g/dL (ref 1.5–4.5)
Glucose: 96 mg/dL (ref 70–99)
Potassium: 4 mmol/L (ref 3.5–5.2)
Sodium: 139 mmol/L (ref 134–144)
Total Protein: 7.1 g/dL (ref 6.0–8.5)
eGFR: 84 mL/min/{1.73_m2} (ref >59)

## 2021-04-03 LAB — LIPID PANEL
Chol/HDL Ratio: 4.6 ratio (ref 0.0–5.0)
Cholesterol, Total: 205 mg/dL — ABNORMAL HIGH (ref 100–199)
HDL: 45 mg/dL (ref >39)
LDL Chol Calc (NIH): 125 mg/dL — ABNORMAL HIGH (ref 0–99)
Triglycerides: 199 mg/dL — ABNORMAL HIGH (ref 0–149)
VLDL Cholesterol Cal: 35 mg/dL (ref 5–40)

## 2021-04-03 LAB — PSA: Prostate Specific Ag, Serum: 2.2 ng/mL (ref 0.0–4.0)

## 2021-04-05 ENCOUNTER — Encounter: Payer: Self-pay | Admitting: Internal Medicine

## 2021-05-26 ENCOUNTER — Telehealth: Payer: Managed Care, Other (non HMO) | Admitting: Physician Assistant

## 2021-05-26 DIAGNOSIS — H60331 Swimmer's ear, right ear: Secondary | ICD-10-CM

## 2021-05-26 MED ORDER — NEOMYCIN-POLYMYXIN-HC 3.5-10000-1 OT SOLN: 4.0000 [drp] | Freq: Four times a day (QID) | OTIC | 0 refills | Status: AC

## 2021-05-26 NOTE — Progress Notes (Signed)
I have spent 5 minutes in review of e-visit questionnaire, review and updating patient chart, medical decision making and response to patient.   Shundra Wirsing Cody Justis Dupas, PA-C    

## 2021-05-26 NOTE — Progress Notes (Signed)
E Visit for Swimmer's Ear  We are sorry that you are not feeling well. Here is how we plan to help!  Based on what you have shared with me it looks like you have swimmers ear. Swimmer's ear is a redness or swelling, irritation, or infection of your outer ear canal.  These symptoms usually occur within a few days of swimming.  Your ear canal is a tube that goes from the opening of the ear to the eardrum.  When water stays in your ear canal, germs can grow.  This is a painful condition that often happens to children and swimmers of all ages.  It is not contagious and oral antibiotics are not required to treat uncomplicated swimmer's ear.  The usual symptoms include: Itching inside the ear, Redness or a sense of swelling in the ear, Pain when the ear is tugged on when pressure is placed on the ear, Pus draining from the infected ear. and I have prescribed: Neomycin 0.35%, polymyxin B 10,000 units/mL, and hydrocortisone 0,5% otic solution 4 drops in affected ears four times a day for 7 days  In certain cases swimmer's ear may progress to a more serious bacterial infection of the middle or inner ear.  If you have a fever 102 and up and significantly worsening symptoms, this could indicate a more serious infection moving to the middle/inner and needs face to face evaluation in an office by a provider.  Your symptoms should improve over the next 3 days and should resolve in about 7 days.  HOME CARE:  Wash your hands frequently. Do not place the tip of the bottle on your ear or touch it with your fingers. You can take Acetominophen 650 mg every 4-6 hours as needed for pain.  If pain is severe or moderate, you can apply a heating pad (set on low) or hot water bottle (wrapped in a towel) to outer ear for 20 minutes.  This will also increase drainage. Avoid ear plugs Do not use Q-tips After showers, help the water run out by tilting your head to one side.  GET HELP RIGHT AWAY IF:  Fever is over 102.2  degrees. You develop progressive ear pain or hearing loss. Ear symptoms persist longer than 3 days after treatment.  MAKE SURE YOU:  Understand these instructions. Will watch your condition. Will get help right away if you are not doing well or get worse.  TO PREVENT SWIMMER'S EAR: Use a bathing cap or custom fitted swim molds to keep your ears dry. Towel off after swimming to dry your ears. Tilt your head or pull your earlobes to allow the water to escape your ear canal. If there is still water in your ears, consider using a hairdryer on the lowest setting.   Thank you for choosing an e-visit.  Your e-visit answers were reviewed by a board certified advanced clinical practitioner to complete your personal care plan. Depending upon the condition, your plan could have included both over the counter or prescription medications.  Please review your pharmacy choice. Make sure the pharmacy is open so you can pick up prescription now. If there is a problem, you may contact your provider through MyChart messaging and have the prescription routed to another pharmacy.  Your safety is important to us. If you have drug allergies check your prescription carefully.   For the next 24 hours you can use MyChart to ask questions about today's visit, request a non-urgent call back, or ask for a work or   school excuse. You will get an email in the next two days asking about your experience. I hope that your e-visit has been valuable and will speed your recovery.    

## 2021-06-15 ENCOUNTER — Ambulatory Visit (INDEPENDENT_AMBULATORY_CARE_PROVIDER_SITE_OTHER): Payer: Self-pay | Admitting: *Deleted

## 2021-06-15 VITALS — Ht 75.0 in | Wt 300.0 lb

## 2021-06-15 DIAGNOSIS — Z1211 Encounter for screening for malignant neoplasm of colon: Secondary | ICD-10-CM

## 2021-06-15 NOTE — Progress Notes (Addendum)
Gastroenterology Pre-Procedure Review ? ?Request Date: 06/15/2021 ?Requesting Physician: Dr. Thersa Salt @ [facility], Last TCS done 10/13/2014 by Dr. Oneida Alar, small internal hemorrhoids, normal colon, external hemorrhoids, 5 year repeat recommended, family hx of colon cancer (father and uncle) ? ?PATIENT REVIEW QUESTIONS: The patient responded to the following health history questions as indicated:   ? ?1. Diabetes Melitis: no ?2. Joint replacements in the past 12 months: no ?3. Major health problems in the past 3 months: no ?4. Has an artificial valve or MVP: no ?5. Has a defibrillator: no ?6. Has been advised in past to take antibiotics in advance of a procedure like teeth cleaning: yes, but doesn't have to anymore ?7. Family history of colon cancer: yes, father: rectal cancer at age 1, uncle: colon cancer at age 37's or 72's  ?8. Alcohol Use: yes, 3 or 4 beers a month ?9. Illicit drug Use: no ?10. History of sleep apnea: no ?11. History of coronary artery or other vascular stents placed within the last 12 months: no ?12. History of any prior anesthesia complications: yes, vagal response-passes out when he gets up to go home ?13. Body mass index is 37.5 kg/m?. ?   ?MEDICATIONS & ALLERGIES:    ?Patient reports the following regarding taking any blood thinners:   ?Plavix? no ?Aspirin? no ?Coumadin? no ?Brilinta? no ?Xarelto? no ?Eliquis? no ?Pradaxa? no ?Savaysa? no ?Effient? no ? ?Patient confirms/reports the following medications:  ?Current Outpatient Medications  ?Medication Sig Dispense Refill  ? amLODipine (NORVASC) 10 MG tablet TAKE 1 TABLET BY MOUTH EVERY DAY 90 tablet 1  ? cholecalciferol (VITAMIN D) 1000 UNITS tablet Take 1,000 Units by mouth daily.    ? ECHINACEA PO Take 1 tablet by mouth daily.    ? fexofenadine (ALLEGRA) 180 MG tablet Take 180 mg by mouth daily.    ? Flaxseed, Linseed, (FLAX SEED OIL PO) Take 1 capsule by mouth daily.    ? Ginger, Zingiber officinalis, (GINGER PO) Take 1  capsule by mouth daily.    ? Glucosamine HCl (GLUCOSAMINE PO) Take 2 tablets by mouth daily.     ? hydrochlorothiazide (HYDRODIURIL) 25 MG tablet Take 1 tablet (25 mg total) by mouth daily. 90 tablet 3  ? KRILL OIL PO Take 1 capsule by mouth daily.    ? Multiple Vitamin (MULTIVITAMIN WITH MINERALS) TABS tablet Take 1 tablet by mouth daily.    ? POTASSIUM PO Take 1 tablet by mouth daily.     ? TURMERIC PO Take 1 tablet by mouth daily.    ? vitamin C (ASCORBIC ACID) 500 MG tablet Take 500 mg by mouth daily.    ? ?No current facility-administered medications for this visit.  ? ? ?Patient confirms/reports the following allergies:  ?Allergies  ?Allergen Reactions  ? Penicillins Other (See Comments)  ?  Patient states he was deathly ill  ? ? ?No orders of the defined types were placed in this encounter. ? ? ?AUTHORIZATION INFORMATION ?Primary Insurance: [place],  [place] #: [id number],  Group #: [id number] ?Pre-Cert / Josem Kaufmann required: No, not required ? ?SCHEDULE INFORMATION: ?Procedure has been scheduled as follows:  ?Date: 07/06/2021, Time: 1:45 ?Location: [facility] with Dr. Abbey Chatters ? ?This Gastroenterology Pre-Precedure Review Form is being routed to the following provider(s): Neil Crouch, PA-C ?  ?

## 2021-06-25 NOTE — Progress Notes (Signed)
Ok to schedule. ASA 2. ?Needs BMP. ?

## 2021-06-28 ENCOUNTER — Encounter: Payer: Self-pay | Admitting: *Deleted

## 2021-06-28 ENCOUNTER — Other Ambulatory Visit: Payer: Self-pay | Admitting: *Deleted

## 2021-06-28 ENCOUNTER — Telehealth: Payer: Self-pay | Admitting: *Deleted

## 2021-06-28 DIAGNOSIS — Z1211 Encounter for screening for malignant neoplasm of colon: Secondary | ICD-10-CM

## 2021-06-28 MED ORDER — PEG 3350-KCL-NA BICARB-NACL 420 G PO SOLR: 4000.0000 mL | Freq: Once | ORAL | 0 refills | Status: AC

## 2021-06-28 NOTE — Telephone Encounter (Signed)
Called pt back and scheduled procedure.  See triage. ?

## 2021-06-28 NOTE — Progress Notes (Signed)
Also, emailed instructions to [email or web address]. ?

## 2021-06-28 NOTE — Addendum Note (Signed)
Addended by: Metro Kung on: 06/28/2021 01:01 PM ? ? Modules accepted: Orders ? ?

## 2021-06-28 NOTE — Progress Notes (Signed)
Spoke to pt.  Scheduled procedure for 07/06/2021 at 1:45, arrival 12:15 at [facility].  Reviewed prep instructions with pt by phone.  Pt aware to pick up prep kit at his pharmacy.  He is aware to have labs drawn at [facility] on 07/02/2021.  Confirmed mailing address and mailed prep instructions. ?

## 2021-06-28 NOTE — Telephone Encounter (Signed)
Patient left VM stating he was returning a call to Angie to schedule procedure. Please call back. Thanks! ?

## 2021-06-28 NOTE — Progress Notes (Signed)
Lmom for pt to call me back. 

## 2021-07-02 ENCOUNTER — Other Ambulatory Visit (HOSPITAL_COMMUNITY)
Admission: RE | Admit: 2021-07-02 | Discharge: 2021-07-02 | Disposition: A | Discharge status: Home / Self Care | Payer: Managed Care, Other (non HMO) | Source: Ambulatory Visit | Attending: Internal Medicine | Admitting: Internal Medicine

## 2021-07-02 DIAGNOSIS — Z1211 Encounter for screening for malignant neoplasm of colon: Secondary | ICD-10-CM | POA: Insufficient documentation

## 2021-07-02 LAB — BASIC METABOLIC PANEL
Anion gap: 9 (ref 5–15)
BUN: 23 mg/dL — ABNORMAL HIGH (ref 6–20)
CO2: 26 mmol/L (ref 22–32)
Calcium: 9.2 mg/dL (ref 8.9–10.3)
Chloride: 102 mmol/L (ref 98–111)
Creatinine, Ser: 0.91 mg/dL (ref 0.61–1.24)
GFR, Estimated: >60 mL/min (ref >60)
Glucose, Bld: 125 mg/dL — ABNORMAL HIGH (ref 70–99)
Potassium: 3.4 mmol/L — ABNORMAL LOW (ref 3.5–5.1)
Sodium: 137 mmol/L (ref 135–145)

## 2021-07-06 ENCOUNTER — Encounter (HOSPITAL_COMMUNITY): Payer: Self-pay

## 2021-07-06 ENCOUNTER — Encounter (HOSPITAL_COMMUNITY)
Admission: RE | Disposition: A | Discharge status: Home / Self Care | Payer: Self-pay | Source: Home / Self Care | Attending: Internal Medicine

## 2021-07-06 ENCOUNTER — Ambulatory Visit (HOSPITAL_COMMUNITY): Payer: Managed Care, Other (non HMO) | Admitting: Certified Registered Nurse Anesthetist

## 2021-07-06 ENCOUNTER — Ambulatory Visit (HOSPITAL_COMMUNITY)
Admission: RE | Admit: 2021-07-06 | Discharge: 2021-07-06 | Disposition: A | Discharge status: Home / Self Care | Payer: Managed Care, Other (non HMO) | Attending: Internal Medicine | Admitting: Internal Medicine

## 2021-07-06 ENCOUNTER — Other Ambulatory Visit: Payer: Self-pay

## 2021-07-06 ENCOUNTER — Ambulatory Visit (HOSPITAL_BASED_OUTPATIENT_CLINIC_OR_DEPARTMENT_OTHER): Payer: Managed Care, Other (non HMO) | Admitting: Certified Registered Nurse Anesthetist

## 2021-07-06 DIAGNOSIS — D122 Benign neoplasm of ascending colon: Secondary | ICD-10-CM | POA: Diagnosis not present

## 2021-07-06 DIAGNOSIS — K635 Polyp of colon: Secondary | ICD-10-CM | POA: Diagnosis not present

## 2021-07-06 DIAGNOSIS — Z1211 Encounter for screening for malignant neoplasm of colon: Secondary | ICD-10-CM | POA: Diagnosis not present

## 2021-07-06 DIAGNOSIS — Z800 Family history of malignant neoplasm of digestive organs: Secondary | ICD-10-CM

## 2021-07-06 DIAGNOSIS — K648 Other hemorrhoids: Secondary | ICD-10-CM | POA: Insufficient documentation

## 2021-07-06 DIAGNOSIS — I10 Essential (primary) hypertension: Secondary | ICD-10-CM | POA: Insufficient documentation

## 2021-07-06 DIAGNOSIS — K219 Gastro-esophageal reflux disease without esophagitis: Secondary | ICD-10-CM | POA: Diagnosis not present

## 2021-07-06 HISTORY — PX: POLYPECTOMY: SHX5525

## 2021-07-06 HISTORY — PX: COLONOSCOPY WITH PROPOFOL: SHX5780

## 2021-07-06 SURGERY — COLONOSCOPY WITH PROPOFOL
Anesthesia: General

## 2021-07-06 MED ORDER — LACTATED RINGERS IV SOLN: INTRAVENOUS | Status: DC

## 2021-07-06 MED ORDER — PROPOFOL 10 MG/ML IV BOLUS
INTRAVENOUS | Status: DC | PRN
  Administered 2021-07-06: 100 mg via INTRAVENOUS
  Administered 2021-07-06: 50 mg via INTRAVENOUS
  Administered 2021-07-06: 30 mg via INTRAVENOUS
  Administered 2021-07-06: 50 mg via INTRAVENOUS
  Administered 2021-07-06: 100 mg via INTRAVENOUS
  Administered 2021-07-06: 30 mg via INTRAVENOUS

## 2021-07-06 MED ORDER — LIDOCAINE HCL (CARDIAC) PF 100 MG/5ML IV SOSY
PREFILLED_SYRINGE | INTRAVENOUS | Status: DC | PRN
  Administered 2021-07-06: 50 mg via INTRAVENOUS

## 2021-07-06 NOTE — Discharge Instructions (Addendum)
  Colonoscopy Discharge Instructions  Read the instructions outlined below and refer to this sheet in the next few weeks. These discharge instructions provide you with general information on caring for yourself after you leave the hospital. Your doctor may also give you specific instructions. While your treatment has been planned according to the most current medical practices available, unavoidable complications occasionally occur.   ACTIVITY You may resume your regular activity, but move at a slower pace for the next 24 hours.  Take frequent rest periods for the next 24 hours.  Walking will help get rid of the air and reduce the bloated feeling in your belly (abdomen).  No driving for 24 hours (because of the medicine (anesthesia) used during the test).   Do not sign any important legal documents or operate any machinery for 24 hours (because of the anesthesia used during the test).  NUTRITION Drink plenty of fluids.  You may resume your normal diet as instructed by your doctor.  Begin with a light meal and progress to your normal diet. Heavy or fried foods are harder to digest and may make you feel sick to your stomach (nauseated).  Avoid alcoholic beverages for 24 hours or as instructed.  MEDICATIONS You may resume your normal medications unless your doctor tells you otherwise.  WHAT YOU CAN EXPECT TODAY Some feelings of bloating in the abdomen.  Passage of more gas than usual.  Spotting of blood in your stool or on the toilet paper.  IF YOU HAD POLYPS REMOVED DURING THE COLONOSCOPY: No aspirin products for 7 days or as instructed.  No alcohol for 7 days or as instructed.  Eat a soft diet for the next 24 hours.  FINDING OUT THE RESULTS OF YOUR TEST Not all test results are available during your visit. If your test results are not back during the visit, make an appointment with your caregiver to find out the results. Do not assume everything is normal if you have not heard from your  caregiver or the medical facility. It is important for you to follow up on all of your test results.  SEEK IMMEDIATE MEDICAL ATTENTION IF: You have more than a spotting of blood in your stool.  Your belly is swollen (abdominal distention).  You are nauseated or vomiting.  You have a temperature over 101.  You have abdominal pain or discomfort that is severe or gets worse throughout the day.   Your colonoscopy revealed 2 polyp(s) which I removed successfully. Await pathology results, my office will contact you. I recommend repeating colonoscopy in 5 years for surveillance purposes. Otherwise follow up with GI as needed.    I hope you have a great rest of your week!  Charles K. Carver, D.O. Gastroenterology and Hepatology [facility]  

## 2021-07-06 NOTE — Op Note (Signed)
[facility] ?Patient Name: Brady [name] ?Procedure Date: 07/06/2021 12:55 PM ?MRN: [id number] ?Date of Birth: 1971/01/26 ?Attending MD: Elon Alas. Abbey Chatters , DO ?CSN: [id number] ?Age: 51 ?Admit Type: Outpatient ?Procedure:                Colonoscopy ?Indications:              Colon cancer screening in patient at increased  ?                          risk: Colorectal cancer in father ?Providers:                Elon Alas. Abbey Chatters, DO, Janeece Riggers, RN, Kristine L.  ?                          Risa Grill, Technician ?Referring MD:              ?Medicines:                See the Anesthesia note for documentation of the  ?                          administered medications ?Complications:            No immediate complications. ?Estimated Blood Loss:     Estimated blood loss was minimal. ?Procedure:                Pre-Anesthesia Assessment: ?                          - The anesthesia plan was to use monitored  ?                          anesthesia care (MAC). ?                          After obtaining informed consent, the colonoscope  ?                          was passed under direct vision. Throughout the  ?                          procedure, the patient's blood pressure, pulse, and  ?                          oxygen saturations were monitored continuously. The  ?                          PCF-HQ190L ([id number]) scope was introduced through  ?                          the anus and advanced to the the cecum, identified  ?                          by appendiceal orifice and ileocecal valve. The  ?                          colonoscopy was performed  without difficulty. The  ?                          patient tolerated the procedure well. The quality  ?                          of the bowel preparation was evaluated using the  ?                          BBPS [facility] Bowel Preparation Scale) with scores  ?                          of: Right Colon = 3, Transverse Colon = 3 and Left  ?                          Colon = 3 (entire  mucosa seen well with no residual  ?                          staining, small fragments of stool or opaque  ?                          liquid). The total BBPS score equals 9. ?Scope In: 1:07:49 PM ?Scope Out: 1:21:24 PM ?Scope Withdrawal Time: 0 hours 10 minutes 13 seconds  ?Total Procedure Duration: 0 hours 13 minutes 35 seconds  ?Findings: ?     The perianal and digital rectal examinations were normal. ?     Non-bleeding internal hemorrhoids were found during endoscopy. ?     A 6 mm polyp was found in the ascending colon. The polyp was sessile.  ?     The polyp was removed with a cold snare. Resection and retrieval were  ?     complete. ?     A 5 mm polyp was found in the sigmoid colon. The polyp was sessile. The  ?     polyp was removed with a cold snare. Resection and retrieval were  ?     complete. ?     The exam was otherwise without abnormality. ?Impression:               - Non-bleeding internal hemorrhoids. ?                          - One 6 mm polyp in the ascending colon, removed  ?                          with a cold snare. Resected and retrieved. ?                          - One 5 mm polyp in the sigmoid colon, removed with  ?                          a cold snare. Resected and retrieved. ?                          - The examination was otherwise normal. ?Moderate Sedation: ?  Per Anesthesia Care ?Recommendation:           - Patient has a contact number available for  ?                          emergencies. The signs and symptoms of potential  ?                          delayed complications were discussed with the  ?                          patient. Return to normal activities tomorrow.  ?                          Written discharge instructions were provided to the  ?                          patient. ?                          - Resume previous diet. ?                          - Continue present medications. ?                          - Await pathology results. ?                          - Repeat  colonoscopy in 5 years for surveillance  ?                          and family history of colon cancer ?                          - Return to GI clinic PRN. ?Procedure Code(s):        --- Professional --- ?                          [id number], Colonoscopy, flexible; with removal of  ?                          tumor(s), polyp(s), or other lesion(s) by snare  ?                          technique ?Diagnosis Code(s):        --- Professional --- ?                          Z80.0, Family history of malignant neoplasm of  ?                          digestive organs ?                          K63.5, Polyp of colon ?  K64.8, Other hemorrhoids ?CPT copyright 2019 American Medical Association. All rights reserved. ?The codes documented in this report are preliminary and upon coder review may  ?be revised to meet current compliance requirements. ?Elon Alas. Abbey Chatters, DO ?Elon Alas. Abbey Chatters, DO ?07/06/2021 1:24:38 PM ?This report has been signed electronically. ?Number of Addenda: 0 ?

## 2021-07-06 NOTE — H&P (Signed)
Primary Care Physician:  Coral Spikes, DO ?Primary Gastroenterologist:  Dr. Abbey Chatters ? ?Pre-Procedure History & Physical: ?HPI:  Brady [name] is a 51 y.o. male is here for a colonoscopy to be performed for high risk colon cancer screening purposes/family history of colon cancer. Last TCS done 10/13/2014 by Dr. Oneida Alar, small internal hemorrhoids, normal colon, external hemorrhoids, 5 year repeat recommended, family hx of colon cancer (father and uncle) ? ?Past Medical History:  ?Diagnosis Date  ? Allergic rhinitis   ? Anxiety   ? Chronic pansinusitis   ? GERD (gastroesophageal reflux disease)   ? Hypertension   ? IBS (irritable bowel syndrome)   ? Mitral prolapse   ? Reflux   ? ? ?Past Surgical History:  ?Procedure Laterality Date  ? CARPAL TUNNEL RELEASE    ? COLONOSCOPY    ? COLONOSCOPY N/A 10/13/2014  ? Procedure: COLONOSCOPY;  Surgeon: Danie Binder, MD;  Location: [facility];  Service: Endoscopy;  Laterality: N/A;  0830 ?  ? FRONTAL SINUS EXPLORATION Bilateral 01/30/2015  ? Procedure: FRONTAL SINUS EXPLORATION;  Surgeon: Izora Gala, MD;  Location: [place];  Service: ENT;  Laterality: Bilateral;  ? LATERAL EPICONDYLE RELEASE  07/08/2011  ? Procedure: TENNIS ELBOW RELEASE;  Surgeon: Carole Civil, MD;  Location: [facility];  Service: Orthopedics;  Laterality: Left;  Left Tennis Elbow Release  ? NASAL SINUS SURGERY Bilateral 01/30/2015  ? Procedure:  BILATERAL ENDOSCOPIC MAXILLARY, ETHMOID AND FRONTAL SINUS SURGERY;  Surgeon: Izora Gala, MD;  Location: [place];  Service: ENT;  Laterality: Bilateral;  ? TENNIS ELBOW RELEASE/NIRSCHEL PROCEDURE Right 08/08/2014  ? Procedure: RIGHT TENNIS ELBOW RELEASE;  Surgeon: Carole Civil, MD;  Location: [facility];  Service: Orthopedics;  Laterality: Right;  ? TONSILLECTOMY    ? WISDOM TOOTH EXTRACTION    ? ? ?Prior to Admission medications   ?Medication Sig Start Date End Date Taking? Authorizing Provider  ?amLODipine (NORVASC) 10 MG  tablet TAKE 1 TABLET BY MOUTH EVERY DAY 04/01/21  Yes Thersa Salt G, DO  ?cholecalciferol (VITAMIN D) 1000 UNITS tablet Take 1,000 Units by mouth daily.   Yes [provider]  ?ECHINACEA PO Take 1 tablet by mouth daily.   Yes [provider]  ?fexofenadine (ALLEGRA) 180 MG tablet Take 180 mg by mouth daily.   Yes [provider]  ?Flaxseed, Linseed, (FLAX SEED OIL PO) Take 1 capsule by mouth daily.   Yes [provider]  ?Ginger, Zingiber officinalis, (GINGER PO) Take 1 capsule by mouth daily.   Yes [provider]  ?Glucosamine HCl (GLUCOSAMINE PO) Take 2 tablets by mouth daily.    Yes [provider]  ?hydrochlorothiazide (HYDRODIURIL) 25 MG tablet Take 1 tablet (25 mg total) by mouth daily. 04/02/21  Yes Coral Spikes, DO  ?KRILL OIL PO Take 1 capsule by mouth daily.   Yes [provider]  ?Multiple Vitamin (MULTIVITAMIN WITH MINERALS) TABS tablet Take 1 tablet by mouth daily.   Yes [provider]  ?POTASSIUM PO Take 1 tablet by mouth daily.    Yes [provider]  ?TURMERIC PO Take 1 tablet by mouth daily.   Yes [provider]  ?vitamin C (ASCORBIC ACID) 500 MG tablet Take 500 mg by mouth daily.   Yes [provider]  ? ? ?Allergies as of 06/28/2021 - Review Complete 06/15/2021  ?Allergen Reaction Noted  ? Penicillins Other (See Comments) 03/29/2011  ? ? ?Family History  ?Problem Relation  Age of Onset  ? Hypertension Mother   ? Hypertension Father   ? Diabetes Father   ? Diabetes Other   ? Heart attack Maternal Grandfather   ? ? ?Social History  ? ?Socioeconomic History  ? Marital status: Married  ?  Spouse name: Not on file  ? Number of children: 1  ? Years of education: college  ? Highest education level: Not on file  ?Occupational History  ? Occupation: computer IT  ?Tobacco Use  ? Smoking status: Never  ? Smokeless tobacco: Never  ? Tobacco comments:  ?  Never smoked  ?Substance and Sexual Activity  ? Alcohol  use: Yes  ?  Alcohol/week: 0.0 standard drinks  ?  Comment: one beer a month  ? Drug use: No  ? Sexual activity: Not on file  ?Other Topics Concern  ? Not on file  ?Social History Narrative  ? Not on file  ? ?Social Determinants of Health  ? ?Financial Resource Strain: Not on file  ?Food Insecurity: Not on file  ?Transportation Needs: Not on file  ?Physical Activity: Not on file  ?Stress: Not on file  ?Social Connections: Not on file  ?Intimate Partner Violence: Not on file  ? ? ?Review of Systems: ?See HPI, otherwise negative ROS ? ?Physical Exam: ?Vital signs in last 24 hours: ?  ?  ?General:   Alert,  Well-developed, well-nourished, pleasant and cooperative in NAD ?Head:  Normocephalic and atraumatic. ?Eyes:  Sclera clear, no icterus.   Conjunctiva pink. ?Ears:  Normal auditory acuity. ?Nose:  No deformity, discharge,  or lesions. ?Mouth:  No deformity or lesions, dentition normal. ?Neck:  Supple; no masses or thyromegaly. ?Lungs:  Clear throughout to auscultation.   No wheezes, crackles, or rhonchi. No acute distress. ?Heart:  Regular rate and rhythm; no murmurs, clicks, rubs,  or gallops. ?Abdomen:  Soft, nontender and nondistended. No masses, hepatosplenomegaly or hernias noted. Normal bowel sounds, without guarding, and without rebound.   ?Msk:  Symmetrical without gross deformities. Normal posture. ?Extremities:  Without clubbing or edema. ?Neurologic:  Alert and  oriented x4;  grossly normal neurologically. ?Skin:  Intact without significant lesions or rashes. ?Cervical Nodes:  No significant cervical adenopathy. ?Psych:  Alert and cooperative. Normal mood and affect. ? ?Impression/Plan: ?[name] is here for a colonoscopy to be performed for high risk colon cancer screening purposes/family history of colon cancer. Last TCS done 10/13/2014 by Dr. Oneida Alar, small internal hemorrhoids, normal colon, external hemorrhoids, 5 year repeat recommended, family hx of colon cancer (father and uncle) ? ?The  risks of the procedure including infection, bleed, or perforation as well as benefits, limitations, alternatives and imponderables have been reviewed with the patient. Questions have been answered. All parties agreeable. ? ?

## 2021-07-06 NOTE — Anesthesia Preprocedure Evaluation (Signed)
Anesthesia Evaluation  Patient identified by MRN, date of birth, ID band Patient awake    Reviewed: Allergy & Precautions, H&P , NPO status , Patient's Chart, lab work & pertinent test results, reviewed documented beta blocker date and time   Airway Mallampati: II  TM Distance: >3 FB Neck ROM: full    Dental no notable dental hx.    Pulmonary neg pulmonary ROS,    Pulmonary exam normal breath sounds clear to auscultation       Cardiovascular Exercise Tolerance: Good hypertension, negative cardio ROS   Rhythm:regular Rate:Normal     Neuro/Psych PSYCHIATRIC DISORDERS Anxiety negative neurological ROS     GI/Hepatic Neg liver ROS, GERD  Medicated,  Endo/Other  negative endocrine ROS  Renal/GU negative Renal ROS  negative genitourinary   Musculoskeletal   Abdominal   Peds  Hematology negative hematology ROS (+)   Anesthesia Other Findings   Reproductive/Obstetrics negative OB ROS                             Anesthesia Physical Anesthesia Plan  ASA: 2  Anesthesia Plan: General   Post-op Pain Management:    Induction:   PONV Risk Score and Plan: Propofol infusion  Airway Management Planned:   Additional Equipment:   Intra-op Plan:   Post-operative Plan:   Informed Consent: I have reviewed the patients History and Physical, chart, labs and discussed the procedure including the risks, benefits and alternatives for the proposed anesthesia with the patient or authorized representative who has indicated his/her understanding and acceptance.     Dental Advisory Given  Plan Discussed with: CRNA  Anesthesia Plan Comments:         Anesthesia Quick Evaluation  

## 2021-07-06 NOTE — Transfer of Care (Signed)
Immediate Anesthesia Transfer of Care Note ? ?Patient: Brady [name] ? ?Procedure(s) Performed: COLONOSCOPY WITH PROPOFOL ?POLYPECTOMY ? ?Patient Location: Endoscopy Unit ? ?Anesthesia Type:General ? ?Level of Consciousness: awake ? ?Airway & Oxygen Therapy: Patient Spontanous Breathing ? ?Post-op Assessment: Report given to RN and Post -op Vital signs reviewed and stable ? ?Post vital signs: Reviewed and stable ? ?Last Vitals:  ?Vitals Value Taken Time  ?BP    ?Temp    ?Pulse 75   ?Resp 20   ?SpO2 96%   ? ? ?Last Pain:  ?Vitals:  ? 07/06/21 1311  ?TempSrc:   ?PainSc: 0-No pain  ?   ? ?Patients Stated Pain Goal: 7 (07/06/21 1233) ? ?Complications: No notable events documented. ?

## 2021-07-07 LAB — SURGICAL PATHOLOGY

## 2021-07-08 NOTE — Anesthesia Postprocedure Evaluation (Signed)
Anesthesia Post Note ? ?Patient: Brady [name] ? ?Procedure(s) Performed: COLONOSCOPY WITH PROPOFOL ?POLYPECTOMY ? ?Patient location during evaluation: Phase II ?Anesthesia Type: General ?Level of consciousness: awake ?Pain management: pain level controlled ?Vital Signs Assessment: post-procedure vital signs reviewed and stable ?Respiratory status: spontaneous breathing and respiratory function stable ?Cardiovascular status: blood pressure returned to baseline and stable ?Postop Assessment: no headache and no apparent nausea or vomiting ?Anesthetic complications: no ?Comments: Late entry ? ? ?No notable events documented. ? ? ?Last Vitals:  ?Vitals:  ? 07/06/21 1233 07/06/21 1324  ?BP: (!) 147/103 108/68  ?Pulse: 84   ?Resp: 15 20  ?Temp: 36.8 ?C 36.6 ?C  ?SpO2: 99% 96%  ?  ?Last Pain:  ?Vitals:  ? 07/06/21 1324  ?TempSrc: Oral  ?PainSc: 0-No pain  ? ? ?  ?  ?  ?  ?  ?  ? ?[name] ? ? ? ? ?

## 2021-07-13 ENCOUNTER — Encounter (HOSPITAL_COMMUNITY): Payer: Self-pay | Admitting: Internal Medicine

## 2021-09-24 ENCOUNTER — Other Ambulatory Visit: Payer: Self-pay | Admitting: Family Medicine

## 2021-09-24 DIAGNOSIS — I10 Essential (primary) hypertension: Secondary | ICD-10-CM

## 2021-10-05 ENCOUNTER — Encounter: Payer: Self-pay | Admitting: Family Medicine

## 2021-10-11 ENCOUNTER — Encounter: Payer: Self-pay | Admitting: Family Medicine

## 2021-10-11 ENCOUNTER — Ambulatory Visit (INDEPENDENT_AMBULATORY_CARE_PROVIDER_SITE_OTHER): Payer: Managed Care, Other (non HMO) | Admitting: Family Medicine

## 2021-10-11 DIAGNOSIS — A630 Anogenital (venereal) warts: Secondary | ICD-10-CM

## 2021-10-11 DIAGNOSIS — R053 Chronic cough: Secondary | ICD-10-CM | POA: Diagnosis not present

## 2021-10-11 MED ORDER — IPRATROPIUM BROMIDE 0.06 % NA SOLN: 2.0000 | Freq: Four times a day (QID) | NASAL | 0 refills | Status: DC | PRN

## 2021-10-11 MED ORDER — DOXYCYCLINE HYCLATE 100 MG PO TABS: 100.0000 mg | ORAL_TABLET | Freq: Two times a day (BID) | ORAL | 0 refills | Status: DC

## 2021-10-11 MED ORDER — PODOFILOX 0.5 % EX SOLN: CUTANEOUS | 0 refills | Status: DC

## 2021-10-11 NOTE — Assessment & Plan Note (Signed)
Likely due to post nasal drip.  Has a history of sinusitis.  Continue Allegra. Starting on Atrovent and Doxy.

## 2021-10-11 NOTE — Patient Instructions (Addendum)
Medications as prescribed.  If symptoms persist, please let me know.  Dr. Lacinda Axon

## 2021-10-11 NOTE — Assessment & Plan Note (Signed)
Treating with Podofilox.

## 2021-10-11 NOTE — Progress Notes (Signed)
Subjective:  Patient ID: Brady [name], male    DOB: 04/20/1970  Age: 51 y.o. MRN: [id number]  CC: Chief Complaint  Patient presents with   Cough    Runny nose current   post nasal drainage after meals    Causing cough, vomiting 3 to 4 times per week since May    HPI:  51 year old male with HTN, Obesity, HLD presents for the evaluation of the above.  Patient reports that since May he has had ongoing post nasal drip. This leads to cough and subsequent post tussive emesis.  Recently has had runny nose and congestion as well. No fever. Taking allegra without relief.  He also reports a wart on his scrotum. Was previously treated with a topical medication. He would like this medication again.  Patient Active Problem List   Diagnosis Date Noted   Chronic cough 10/11/2021   Genital warts 10/11/2021   Mixed hyperlipidemia 04/02/2021   Obesity (BMI 30-39.9) 04/02/2021   Essential hypertension 04/12/2018    Social Hx   Social History   Socioeconomic History   Marital status: Married    Spouse name: Not on file   Number of children: 1   Years of education: college   Highest education level: Not on file  Occupational History   Occupation: computer IT  Tobacco Use   Smoking status: Never   Smokeless tobacco: Never   Tobacco comments:    Never smoked  Substance and Sexual Activity   Alcohol use: Yes    Alcohol/week: 0.0 standard drinks of alcohol    Comment: one beer a month   Drug use: No   Sexual activity: Not on file  Other Topics Concern   Not on file  Social History Narrative   Not on file   Social Determinants of Health   Financial Resource Strain: Not on file  Food Insecurity: Not on file  Transportation Needs: Not on file  Physical Activity: Not on file  Stress: Not on file  Social Connections: Not on file    Review of Systems Per HPI  Objective:  BP 132/74   Pulse 65   Temp 98.1 F (36.7 C)   Ht '6\' 3"'$  (1.905 m)   Wt 290 lb (131.5 kg)    SpO2 97%   BMI 36.25 kg/m      10/11/2021   11:14 AM 07/06/2021    1:24 PM 07/06/2021   12:33 PM  BP/Weight  Systolic BP 277 412 878  Diastolic BP 74 68 676  Wt. (Lbs) 290    BMI 36.25 kg/m2      Physical Exam Vitals and nursing note reviewed.  Constitutional:      General: He is not in acute distress.    Appearance: Normal appearance. He is not ill-appearing.  HENT:     Head: Normocephalic and atraumatic.     Right Ear: Tympanic membrane normal.     Left Ear: Tympanic membrane normal.     Mouth/Throat:     Pharynx: Oropharynx is clear.  Eyes:     General:        Right eye: No discharge.        Left eye: No discharge.     Conjunctiva/sclera: Conjunctivae normal.  Cardiovascular:     Rate and Rhythm: Normal rate and regular rhythm.  Pulmonary:     Effort: Pulmonary effort is normal.     Breath sounds: Normal breath sounds. No wheezing, rhonchi or rales.  Genitourinary:  Comments: Scrotum with a raised lesion consistent with genital wart. Neurological:     Mental Status: He is alert.     Lab Results  Component Value Date   WBC 7.7 04/02/2021   HGB 15.7 04/02/2021   HCT 45.7 04/02/2021   PLT 256 04/02/2021   GLUCOSE 125 (H) 07/02/2021   CHOL 205 (H) 04/02/2021   TRIG 199 (H) 04/02/2021   HDL 45 04/02/2021   LDLCALC 125 (H) 04/02/2021   ALT 22 04/02/2021   AST 22 04/02/2021   NA 137 07/02/2021   K 3.4 (L) 07/02/2021   CL 102 07/02/2021   CREATININE 0.91 07/02/2021   BUN 23 (H) 07/02/2021   CO2 26 07/02/2021   TSH 1.200 04/08/2020     Assessment & Plan:   Problem List Items Addressed This Visit       Musculoskeletal and Integument   Genital warts    Treating with Podofilox.        Other   Chronic cough    Likely due to post nasal drip.  Has a history of sinusitis.  Continue Allegra. Starting on Atrovent and Doxy.       Meds ordered this encounter  Medications   podofilox (CONDYLOX) 0.5 % external solution    Sig: Apply topically every  in the morning and evening for 3 days, then withhold for 4 days; repeat cycle up to 4 times    Dispense:  3.5 mL    Refill:  0   doxycycline (VIBRA-TABS) 100 MG tablet    Sig: Take 1 tablet (100 mg total) by mouth 2 (two) times daily.    Dispense:  14 tablet    Refill:  0   ipratropium (ATROVENT) 0.06 % nasal spray    Sig: Place 2 sprays into both nostrils 4 (four) times daily as needed for rhinitis.    Dispense:  15 mL    Refill:  0    Follow-up:  Return in about 6 months (around 04/13/2022).  [place]

## 2021-10-29 ENCOUNTER — Other Ambulatory Visit: Payer: Self-pay | Admitting: Family Medicine

## 2021-10-29 ENCOUNTER — Encounter: Payer: Self-pay | Admitting: Family Medicine

## 2021-10-29 DIAGNOSIS — W57XXXA Bitten or stung by nonvenomous insect and other nonvenomous arthropods, initial encounter: Secondary | ICD-10-CM

## 2021-10-29 MED ORDER — PREDNISONE 10 MG PO TABS: ORAL_TABLET | ORAL | 0 refills | Status: DC

## 2021-11-02 ENCOUNTER — Other Ambulatory Visit: Payer: Self-pay | Admitting: Family Medicine

## 2021-11-02 MED ORDER — DOXYCYCLINE HYCLATE 100 MG PO TABS: 100.0000 mg | ORAL_TABLET | Freq: Two times a day (BID) | ORAL | 0 refills | Status: DC

## 2021-11-13 ENCOUNTER — Emergency Department (HOSPITAL_COMMUNITY)
Admission: EM | Admit: 2021-11-13 | Discharge: 2021-11-13 | Disposition: A | Discharge status: Home / Self Care | Payer: Managed Care, Other (non HMO) | Attending: Emergency Medicine | Admitting: Emergency Medicine

## 2021-11-13 ENCOUNTER — Encounter (HOSPITAL_COMMUNITY): Payer: Self-pay | Admitting: *Deleted

## 2021-11-13 ENCOUNTER — Other Ambulatory Visit: Payer: Self-pay

## 2021-11-13 DIAGNOSIS — W458XXA Other foreign body or object entering through skin, initial encounter: Secondary | ICD-10-CM | POA: Insufficient documentation

## 2021-11-13 DIAGNOSIS — Z23 Encounter for immunization: Secondary | ICD-10-CM | POA: Insufficient documentation

## 2021-11-13 DIAGNOSIS — S60450A Superficial foreign body of right index finger, initial encounter: Secondary | ICD-10-CM | POA: Insufficient documentation

## 2021-11-13 DIAGNOSIS — S6991XA Unspecified injury of right wrist, hand and finger(s), initial encounter: Secondary | ICD-10-CM

## 2021-11-13 MED ORDER — TETANUS-DIPHTH-ACELL PERTUSSIS 5-2.5-18.5 LF-MCG/0.5 IM SUSY
0.5000 mL | PREFILLED_SYRINGE | Freq: Once | INTRAMUSCULAR | Status: AC
Start: 2021-11-13 — End: 2021-11-13
  Administered 2021-11-13: 0.5 mL via INTRAMUSCULAR
  Filled 2021-11-13: qty 0.5

## 2021-11-13 MED ORDER — LIDOCAINE HCL (PF) 1 % IJ SOLN
INTRAMUSCULAR | Status: AC
  Filled 2021-11-13: qty 5

## 2021-11-13 MED ORDER — LIDOCAINE HCL (PF) 1 % IJ SOLN
5.0000 mL | Freq: Once | INTRAMUSCULAR | Status: DC
  Filled 2021-11-13: qty 5

## 2021-11-13 MED ORDER — DOXYCYCLINE HYCLATE 100 MG PO CAPS: 100.0000 mg | ORAL_CAPSULE | Freq: Two times a day (BID) | ORAL | 0 refills | Status: AC

## 2021-11-13 NOTE — ED Provider Notes (Signed)
[facility] EMERGENCY DEPARTMENT Provider Note   CSN: [id number] Arrival date & time: 11/13/21  1550     History  Chief Complaint  Patient presents with   Foreign Body    Brady [name] is a 51 y.o. male.  HPI   Patient without  significant medical history presents with complaints of a fishhook in his hand, he states that he went fishing today and unfortunately the lure got stuck in his right index finger, states that he attempted to remove it but was unsuccessful, he denies any paresthesia or weakness in his finger, he is not immunocompromise, he is not up-to-date on his tetanus shot, he has no other complaints.  Home Medications Prior to Admission medications   Medication Sig Start Date End Date Taking? Authorizing Provider  doxycycline (VIBRAMYCIN) 100 MG capsule Take 1 capsule (100 mg total) by mouth 2 (two) times daily for 7 days. 11/13/21 11/20/21 Yes Marcello Fennel, PA-C  amLODipine (NORVASC) 10 MG tablet TAKE 1 TABLET BY MOUTH EVERY DAY 09/24/21   Coral Spikes, DO  cholecalciferol (VITAMIN D) 1000 UNITS tablet Take 1,000 Units by mouth daily.    [provider]  doxycycline (VIBRA-TABS) 100 MG tablet Take 1 tablet (100 mg total) by mouth 2 (two) times daily. 11/02/21   Cook, Jayce G, DO  ECHINACEA PO Take 1 tablet by mouth daily.    [provider]  fexofenadine (ALLEGRA) 180 MG tablet Take 180 mg by mouth daily.    [provider]  Flaxseed, Linseed, (FLAX SEED OIL PO) Take 1 capsule by mouth daily.    [provider]  Ginger, Zingiber officinalis, (GINGER PO) Take 1 capsule by mouth daily.    [provider]  Glucosamine HCl (GLUCOSAMINE PO) Take 2 tablets by mouth daily.     [provider]  hydrochlorothiazide (HYDRODIURIL) 25 MG tablet Take 1 tablet (25 mg total) by mouth daily. 04/02/21   Thersa Salt G, DO  ipratropium (ATROVENT) 0.06 % nasal spray Place 2 sprays into both nostrils 4 (four) times daily as needed  for rhinitis. 10/11/21   Cook, Jayce G, DO  KRILL OIL PO Take 1 capsule by mouth daily.    [provider]  Multiple Vitamin (MULTIVITAMIN WITH MINERALS) TABS tablet Take 1 tablet by mouth daily.    [provider]  podofilox (CONDYLOX) 0.5 % external solution Apply topically every in the morning and evening for 3 days, then withhold for 4 days; repeat cycle up to 4 times 10/11/21   Coral Spikes, DO  POTASSIUM PO Take 1 tablet by mouth daily.     [provider]  predniSONE (DELTASONE) 10 MG tablet 50 mg daily x 2 days, then 40 mg daily x 2 days, then 30 mg daily x 2 days, then 20 mg daily x 2 days, then 10 mg daily x 2 days. 10/29/21   Coral Spikes, DO  TURMERIC PO Take 1 tablet by mouth daily.    [provider]  vitamin C (ASCORBIC ACID) 500 MG tablet Take 500 mg by mouth daily.    [provider]      Allergies    Penicillins    Review of Systems   Review of Systems  Constitutional:  Negative for chills and fever.  Respiratory:  Negative for shortness of breath.   Cardiovascular:  Negative for chest pain.  Gastrointestinal:  Negative for abdominal pain.  Neurological:  Negative for headaches.    Physical Exam Updated  Vital Signs BP (!) 146/101   Pulse 99   Temp 98.1 F (36.7 C) (Oral)   Resp 18   Ht '6\' 2"'$  (1.88 m)   Wt 129.3 kg   SpO2 95%   BMI 36.59 kg/m  Physical Exam Vitals and nursing note reviewed.  Constitutional:      General: He is not in acute distress.    Appearance: Normal appearance. He is not ill-appearing or diaphoretic.  HENT:     Head: Normocephalic and atraumatic.     Nose: No congestion or rhinorrhea.  Eyes:     General:        Right eye: No discharge.        Left eye: No discharge.  Cardiovascular:     Pulses: Normal pulses.  Pulmonary:     Effort: Pulmonary effort is normal.     Breath sounds: Normal breath sounds.  Musculoskeletal:     Cervical back: Neck supple.     Right lower leg: No edema.      Left lower leg: No edema.     Comments: Focused exam of the right hand was performed, he has a noted fishhook embedded on the palmar aspect of the distal end of the index finger, does not cross the joint line, looks have barbs in him, was unable to remove it with gentle retraction, full sensation movement in his right index finger.    Skin:    General: Skin is warm and dry.  Neurological:     Mental Status: He is alert and oriented to person, place, and time.  Psychiatric:        Mood and Affect: Mood normal.     ED Results / Procedures / Treatments   Labs (all labs ordered are listed, but only abnormal results are displayed) Labs Reviewed - No data to display  EKG None  Radiology No results found.  Procedures .Foreign Body Removal  Date/Time: 11/13/2021 5:18 PM  Performed by: Marcello Fennel, PA-C Authorized by: Marcello Fennel, PA-C  Consent: Verbal consent obtained. Risks and benefits: risks, benefits and alternatives were discussed Consent given by: patient Body area: skin General location: upper extremity Location details: right small finger Anesthesia: local infiltration  Anesthesia: Local Anesthetic: lidocaine 1% without epinephrine Anesthetic total: 1 mL  Sedation: Patient sedated: no  Patient restrained: no Patient cooperative: yes Localization method: visualized Removal mechanism: forceps Tendon involvement: none Depth: subcutaneous Complexity: simple 1 objects recovered. Objects recovered: 1 Post-procedure assessment: foreign body removed Patient tolerance: patient tolerated the procedure well with no immediate complications      Medications Ordered in ED Medications  lidocaine (PF) (XYLOCAINE) 1 % injection 5 mL (has no administration in time range)  Tdap (BOOSTRIX) injection 0.5 mL (has no administration in time range)  lidocaine (PF) (XYLOCAINE) 1 % injection (has no administration in time range)    ED Course/ Medical Decision  Making/ A&P                           Medical Decision Making Risk Prescription drug management.   This patient presents to the ED for concern of foreign body, this involves an extensive number of treatment options, and is a complaint that carries with it a high risk of complications and morbidity.  The differential diagnosis includes tendon damage, fracture, compartment syndrome    Additional history obtained:  Additional history obtained from N/A External records from outside source obtained and reviewed including immunization  records   Co morbidities that complicate the patient evaluation  N/A  Social Determinants of Health:  N/A    Lab Tests:  I Ordered, and personally interpreted labs.  The pertinent results include: N/A   Imaging Studies ordered:  I ordered imaging studies including N/A I independently visualized and interpreted imaging which showed N/A I agree with the radiologist interpretation   Cardiac Monitoring:  The patient was maintained on a cardiac monitor.  I personally viewed and interpreted the cardiac monitored which showed an underlying rhythm of: N/A   Medicines ordered and prescription drug management:  I ordered medication including Tdap, lidocaine I have reviewed the patients home medicines and have made adjustments as needed  Critical Interventions:  N/A   Reevaluation:  Presents with a fishhook in his right index finger, I recommend anesthetizing the area and pushing the point of the hook with the barb through skin so that the point of the hook and barb were exposed and can cut it off with pliers the remainder of the local he gently retracted out of the skin.  Patient was admitted with this, he tolerated the procedure well, afterwards she had full range of motion at the distal end of his finger, there was neurovascular intact, he is agreement plan discharge at this time.    Consultations Obtained:  N/A  Test  Considered:  N/A    Rule out  Low suspicion for ligament damage as she is full range of motion at the DIP joint pre and postprocedure, it was neurovascular intact.  I have low suspicion for fracture as it was a low and active injury, there is no deformity present during my exam, area is nontender with palpation.  Assessment compartment syndrome as all components are soft pre and post procedure    Dispostion and problem list  After consideration of the diagnostic results and the patients response to treatment, I feel that the patent would benefit from discharge.  Foreign body-it was successfully removed, will start him on doxycycline for coverage of vibrio infection, give him basic wound care, follow-up PCP and strict return precautions.            Final Clinical Impression(s) / ED Diagnoses Final diagnoses:  Fish hook injury of finger of right hand, initial encounter    Rx / DC Orders ED Discharge Orders          Ordered    doxycycline (VIBRAMYCIN) 100 MG capsule  2 times daily        11/13/21 1721              Aron Baba 11/13/21 1722    Milton Ferguson, MD 11/14/21 [phone]

## 2021-11-13 NOTE — ED Triage Notes (Signed)
Pt with fishing hook to right index finger.  Last tetanus shot 10-15 years.

## 2021-11-13 NOTE — Discharge Instructions (Signed)
I have removed the fishhook from your finger, I recommend keeping the area clean, may rinse out the area twice daily and change the bandage, I have started you on antibiotics please take as prescribed.  I like you to return to the ED if you note after 2 to 3 days of antibiotic use you start to develop worsening redness or swelling pain  aroubd your finger.

## 2021-11-13 NOTE — ED Notes (Signed)
Finger cleaned and bandaged

## 2021-12-20 ENCOUNTER — Ambulatory Visit: Payer: Managed Care, Other (non HMO) | Admitting: Internal Medicine

## 2021-12-20 ENCOUNTER — Encounter: Payer: Self-pay | Admitting: Internal Medicine

## 2021-12-20 VITALS — BP 134/86 | HR 79 | Temp 98.0°F | Resp 16 | Ht 75.0 in | Wt 283.8 lb

## 2021-12-20 DIAGNOSIS — R053 Chronic cough: Secondary | ICD-10-CM

## 2021-12-20 DIAGNOSIS — Z8709 Personal history of other diseases of the respiratory system: Secondary | ICD-10-CM

## 2021-12-20 DIAGNOSIS — H1013 Acute atopic conjunctivitis, bilateral: Secondary | ICD-10-CM | POA: Diagnosis not present

## 2021-12-20 DIAGNOSIS — J310 Chronic rhinitis: Secondary | ICD-10-CM

## 2021-12-20 DIAGNOSIS — J3089 Other allergic rhinitis: Secondary | ICD-10-CM | POA: Diagnosis not present

## 2021-12-20 DIAGNOSIS — Z91018 Allergy to other foods: Secondary | ICD-10-CM

## 2021-12-20 MED ORDER — FLUTICASONE PROPIONATE 50 MCG/ACT NA SUSP: 2.0000 | Freq: Every day | NASAL | 3 refills | Status: DC

## 2021-12-20 MED ORDER — FEXOFENADINE HCL 180 MG PO TABS: 180.0000 mg | ORAL_TABLET | Freq: Every day | ORAL | 3 refills | Status: DC

## 2021-12-20 MED ORDER — AZELASTINE HCL 0.1 % NA SOLN: 2.0000 | Freq: Two times a day (BID) | NASAL | 3 refills | Status: DC

## 2021-12-20 NOTE — Progress Notes (Signed)
NEW PATIENT  Date of Service/Encounter:  12/20/21  Consult requested by: Coral Spikes, DO   Subjective:   Brady [name] (DOB: September 09, 1970) is a 51 y.o. male who presents to the clinic on 12/20/2021 with a chief complaint of Diarrhea and Nasal Congestion .    History obtained from: chart review and patient.   Rhinitis:  Started since childhood Symptoms include: nasal congestion, rhinorrhea, post nasal drainage, sneezing, watery eyes, and itchy eyes. Mostly mucous drainage is clear.  Sense of smell is reduced. Occurs year-round Potential triggers: pollen, dog dander Treatments tried: benadryl and Allegra PRN; INCS PRN. Last use of anti histamines was last Tuesday Previous allergy testing: yes; when he was really young and can't recall results History of reflux/heartburn:  sometimes has a little bit but does not take anything as it is manageable  History of chronic sinusitis or sinus surgery: yes; fixed deviated septum and possibly removed polyp. He also had some peanut butter consistency mucin.  Chart review shows in 2016 he underwent sinus surgery (bl total ethmoidectomy and frontal sinusotomy) with ENT and had significant polypoid disease and fungal debis.  Since then he has not seen them back.  He has not been on any injectables for his nasal polyps either  Reports around May 2023, he started having a lot of congestion, drainage and coughing to the point of emesis and lightheadedness.  This would generally happen couple of hours after eating.  He has received multiple courses of antibiotics for possible sinus infection without much relief.  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  His wife was also worried about alpha gal so he has cut out red meat also and noticed some relief with it.  He takes benadryl or Allegra PRN which does help.    Past Medical History: Past Medical History:  Diagnosis Date   Allergic  rhinitis    Anxiety    Chronic pansinusitis    GERD (gastroesophageal reflux disease)    Hypertension    IBS (irritable bowel syndrome)    Mitral prolapse    Reflux    Past Surgical History: Past Surgical History:  Procedure Laterality Date   ADENOIDECTOMY     CARPAL TUNNEL RELEASE     COLONOSCOPY     COLONOSCOPY N/A 10/13/2014   Procedure: COLONOSCOPY;  Surgeon: Danie Binder, MD;  [facility];  Service: Endoscopy;  Laterality: N/A;  0830    COLONOSCOPY WITH PROPOFOL N/A 07/06/2021   Procedure: COLONOSCOPY WITH PROPOFOL;  Surgeon: Eloise Harman, DO;  [facility];  Service: Endoscopy;  Laterality: N/A;  1:45 / ASA 2   FRONTAL SINUS EXPLORATION Bilateral 01/30/2015   Procedure: FRONTAL SINUS EXPLORATION;  Surgeon: Izora Gala, MD;  Location: [place];  Service: ENT;  Laterality: Bilateral;   LATERAL EPICONDYLE RELEASE  07/08/2011   Procedure: TENNIS ELBOW RELEASE;  Surgeon: Carole Civil, MD;  Location: [facility];  Service: Orthopedics;  Laterality: Left;  Left Tennis Elbow Release   NASAL SINUS SURGERY Bilateral 01/30/2015   Procedure:  BILATERAL ENDOSCOPIC MAXILLARY, ETHMOID AND FRONTAL SINUS SURGERY;  Surgeon: Izora Gala, MD;  Location: [place];  Service: ENT;  Laterality: Bilateral;   POLYPECTOMY  07/06/2021   Procedure: POLYPECTOMY;  Surgeon: Eloise Harman, DO;  [facility];  Service: Endoscopy;;   TENNIS ELBOW RELEASE/NIRSCHEL PROCEDURE Right 08/08/2014   Procedure:  RIGHT TENNIS ELBOW RELEASE;  Surgeon: Carole Civil, MD;  Location: [facility];  Service: Orthopedics;  Laterality: Right;   TONSILLECTOMY     WISDOM TOOTH EXTRACTION      Family History: Family History  Problem Relation Age of Onset   Allergic rhinitis Mother    Hypertension Mother    Allergic rhinitis Father    Hypertension Father    Diabetes Father    Allergic rhinitis Brother    Heart attack Maternal Grandfather     Diabetes Other     Social History:  Lives in a 52 year house Flooring in bedroom: wood Pets: dog Tobacco use/exposure: none Job: Chief Financial Officer  Medication List:  Allergies as of 12/20/2021       Reactions   Penicillins Other (See Comments)   Patient states he was deathly ill        Medication List        Accurate as of December 20, 2021 11:57 AM. If you have any questions, ask your nurse or doctor.          STOP taking these medications    doxycycline 100 MG tablet Commonly known as: VIBRA-TABS Stopped by: Larose Kells, MD   ipratropium 0.06 % nasal spray Commonly known as: ATROVENT Stopped by: Larose Kells, MD   predniSONE 10 MG tablet Commonly known as: DELTASONE Stopped by: Larose Kells, MD       TAKE these medications    amLODipine 10 MG tablet Commonly known as: NORVASC TAKE 1 TABLET BY MOUTH EVERY DAY   ascorbic acid 500 MG tablet Commonly known as: VITAMIN C Take 500 mg by mouth daily.   cholecalciferol 1000 units tablet Commonly known as: VITAMIN D Take 1,000 Units by mouth daily.   ECHINACEA PO Take 1 tablet by mouth daily.   fexofenadine 180 MG tablet Commonly known as: ALLEGRA Take 180 mg by mouth daily.   FLAX SEED OIL PO Take 1 capsule by mouth daily.   GINGER PO Take 1 capsule by mouth daily.   GLUCOSAMINE PO Take 2 tablets by mouth daily.   hydrochlorothiazide 25 MG tablet Commonly known as: HYDRODIURIL Take 1 tablet (25 mg total) by mouth daily.   KRILL OIL PO Take 1 capsule by mouth daily.   multivitamin with minerals Tabs tablet Take 1 tablet by mouth daily.   podofilox 0.5 % external solution Commonly known as: CONDYLOX Apply topically every in the morning and evening for 3 days, then withhold for 4 days; repeat cycle up to 4 times   POTASSIUM PO Take 1 tablet by mouth daily.   TURMERIC PO Take 1 tablet by mouth daily.         REVIEW OF SYSTEMS: Pertinent positives and negatives discussed in HPI.    Objective:   Physical Exam: BP 134/86 (BP Location: Left Arm, Patient Position: Sitting, Cuff Size: Large)   Pulse 79   Temp 98 F (36.7 C) (Temporal)   Resp 16   Ht '6\' 3"'$  (1.905 m)   Wt 283 lb 12.8 oz (128.7 kg)   SpO2 96%   BMI 35.47 kg/m  Body mass index is 35.47 kg/m. GEN: alert, well developed HEENT: clear conjunctiva, TM grey and translucent, nose with + inferior turbinate hypertrophy, pink nasal mucosa, slight clear rhinorrhea, + cobblestoning, no clear polyps visible HEART: regular rate and rhythm, no murmur LUNGS: clear to auscultation bilaterally, no coughing, unlabored respiration ABDOMEN: soft, non distended  SKIN: no rashes or lesions  Reviewed:  ENT notes from 2016   Skin Testing:  Skin prick testing was placed, which includes aeroallergens/foods, histamine control, and saline control.  Verbal consent was obtained prior to placing test.  We discussed risks including anaphylaxis. Patient tolerated procedure well.  Allergy testing results were read and interpreted by myself, documented by clinical staff. Adequate positive and negative control.  Results discussed with patient/family.  Airborne Adult Perc - 12/20/21 0951     Time Antigen Placed 6389    Allergen Manufacturer Lavella Hammock    Location Back    Number of Test 59             Intradermal - 12/20/21 1028     Time Antigen Placed 1028    Allergen Manufacturer Greer    Location Arm    Number of Test 15               Assessment:   1. Chronic rhinitis   2. Food allergy   3. Perennial allergic rhinitis   4. Allergic conjunctivitis of both eyes   5. Chronic cough     Plan/Recommendations:  Mixed Rhinitis Concern for Alpha Gal - Positive skin test to: mold and dust mites - Avoidance measures discussed. - Use nasal saline rinses before nose sprays such as with Neilmed Sinus Rinse.  Use distilled water.   - Use Flonase 2 sprays each nostril daily. Aim upward and outward. - Use Azelastine  1-2 sprays each nostril twice daily. Aim upward and outward. - Use Allegra '180mg'$  daily.  - Refer to ENT.  Has a history of polyps and fungal debris. Wonder if his symptoms are related to uncontrolled CRS with nasal polyps or AFS.   - Symptoms do not fit clearly with alpha gal but will order as patient is worried about a red meat allergy.  Return in about 6 weeks (around 01/31/2022).  Harlon Flor, MD [facility]

## 2021-12-20 NOTE — Addendum Note (Signed)
Addended by: Norville Haggard on: 12/20/2021 12:43 PM   Modules accepted: Orders

## 2021-12-20 NOTE — Patient Instructions (Addendum)
Rhinitis: - Positive skin test to: mold and dust mites - Avoidance measures discussed. - Use nasal saline rinses before nose sprays such as with Neilmed Sinus Rinse.  Use distilled water.   - Use Flonase 2 sprays each nostril daily. Aim upward and outward. - Use Azelastine 1-2 sprays each nostril twice daily. Aim upward and outward. - Use Allegra '180mg'$  daily.  - Follow up with ENT.  ALLERGEN AVOIDANCE MEASURES  Dust Mites Use central air conditioning and heat; and change the filter monthly.  Pleated filters work better than mesh filters.  Electrostatic filters may also be used; wash the filter monthly.  Window air conditioners may be used, but do not clean the air as well as a central air conditioner.  Change or wash the filter monthly. Keep windows closed.  Do not use attic fans.   Encase the mattress, box springs and pillows with zippered, dust proof covers. Wash the bed linens in hot water weekly.   Remove carpet, especially from the bedroom. Remove stuffed animals, throw pillows, dust ruffles, heavy drapes and other items that collect dust from the bedroom. Do not use a humidifier.   Use wood, vinyl or leather furniture instead of cloth furniture in the bedroom. Keep the indoor humidity at 30 - 40%.  Monitor with a humidity gauge.  Molds - Indoor avoidance Use air conditioning to reduce indoor humidity.  Do not use a humidifier. Keep indoor humidity at 30 - 40%.  Use a dehumidifier if needed. In the bathroom use an exhaust fan or open a window after showering.  Wipe down damp surfaces after showering.  Clean bathrooms with a mold-killing solution (diluted bleach, or products like Tilex, etc) at least once a month. In the kitchen use an exhaust fan to remove steam from cooking.  Throw away spoiled foods immediately, and empty garbage daily.  Empty water pans below self-defrosting refrigerators frequently. Vent the clothes dryer to the outside. Limit indoor houseplants; mold grows in  the dirt.  No houseplants in the bedroom. Remove carpet from the bedroom. Encase the mattress and box springs with a zippered encasing.  Molds - Outdoor avoidance Avoid being outside when the grass is being mowed, or the ground is tilled. Avoid playing in leaves, pine straw, hay, etc.  Dead plant materials contain mold. Avoid going into barns or grain storage areas. Remove leaves, clippings and compost from around the home.

## 2021-12-22 LAB — ALPHA-GAL PANEL
Allergen Lamb IgE: <0.1 kU/L
Beef IgE: <0.1 kU/L
IgE (Immunoglobulin E), Serum: <2 IU/mL — ABNORMAL LOW (ref 6–495)
O215-IgE Alpha-Gal: <0.1 kU/L
Pork IgE: <0.1 kU/L

## 2022-01-05 ENCOUNTER — Telehealth: Payer: Self-pay

## 2022-01-05 NOTE — Telephone Encounter (Signed)
-----   Message from Larose Kells, MD sent at 12/20/2021 12:00 PM EDT ----- Hello, could we please help him get scheduled with ENT? I have placed a referral in chart.

## 2022-01-22 ENCOUNTER — Other Ambulatory Visit: Payer: Self-pay | Admitting: Internal Medicine

## 2022-01-22 ENCOUNTER — Other Ambulatory Visit: Payer: Self-pay | Admitting: Family Medicine

## 2022-01-22 DIAGNOSIS — I10 Essential (primary) hypertension: Secondary | ICD-10-CM

## 2022-01-31 ENCOUNTER — Encounter: Payer: Self-pay | Admitting: Internal Medicine

## 2022-01-31 ENCOUNTER — Ambulatory Visit: Payer: Managed Care, Other (non HMO) | Admitting: Internal Medicine

## 2022-01-31 VITALS — BP 108/60 | HR 88 | Temp 98.7°F | Resp 20 | Ht 75.0 in | Wt 290.8 lb

## 2022-01-31 DIAGNOSIS — J3089 Other allergic rhinitis: Secondary | ICD-10-CM

## 2022-01-31 DIAGNOSIS — H1013 Acute atopic conjunctivitis, bilateral: Secondary | ICD-10-CM

## 2022-01-31 DIAGNOSIS — Z880 Allergy status to penicillin: Secondary | ICD-10-CM

## 2022-01-31 DIAGNOSIS — Z8709 Personal history of other diseases of the respiratory system: Secondary | ICD-10-CM | POA: Diagnosis not present

## 2022-01-31 MED ORDER — IPRATROPIUM BROMIDE 0.06 % NA SOLN: 1.0000 | Freq: Four times a day (QID) | NASAL | 5 refills | Status: DC | PRN

## 2022-01-31 MED ORDER — FLUTICASONE PROPIONATE 50 MCG/ACT NA SUSP: 2.0000 | Freq: Every day | NASAL | 5 refills | Status: DC

## 2022-01-31 MED ORDER — FEXOFENADINE HCL 180 MG PO TABS: 180.0000 mg | ORAL_TABLET | Freq: Every day | ORAL | 5 refills | Status: DC | PRN

## 2022-01-31 MED ORDER — AZELASTINE HCL 0.1 % NA SOLN: 2.0000 | Freq: Two times a day (BID) | NASAL | 5 refills | Status: DC

## 2022-01-31 NOTE — Progress Notes (Signed)
   FOLLOW UP Date of Service/Encounter:  01/31/22   Subjective:  Brady [name] (DOB: 1970-10-16) is a 51 y.o. male who returns to the [facility] [place] on 01/31/2022 for follow up for allergic rhinitis, history of nasal polyps s/p surgery in 2001 and 2016.   History obtained from: chart review and patient.  Last visit was initial with me 12/20/2021. He was seen for significant post nasal drainage with history of 2 sinus surgeries for nasal polyps with last surgery showing fungal debris.  Started on Flonase, Azelastine, Allegra with SPT positive to mold and dust mites.    Rhinitis: Since last visit, he is better but still has post nasal drainage that is triggering a cough.  He is using  Allegra '180mg'$  daily, Flonase 2 SEN daily and Azelastine 2 SEN BID. No nasal dryness or nosebleeds.  He is not doing rinses first.  Reports his sense of smell is fine.  He is due to see ENT 02/2021.  Penicillin Allergy: Reports having hives and maybe trouble breathing in childhood.  No exposure in the last 10 years.    Past Medical History: Past Medical History:  Diagnosis Date   Allergic rhinitis    Anxiety    Chronic pansinusitis    GERD (gastroesophageal reflux disease)    Hypertension    IBS (irritable bowel syndrome)    Mitral prolapse    Reflux     Objective:  BP 108/60   Pulse 88   Temp 98.7 F (37.1 C) (Temporal)   Resp 20   Ht '6\' 3"'$  (1.905 m)   Wt 290 lb 12.8 oz (131.9 kg)   SpO2 96%   BMI 36.35 kg/m  Body mass index is 36.35 kg/m. Physical Exam: GEN: alert, well developed HEENT: clear conjunctiva, TM grey and translucent, nose with moderate inferior turbinate hypertrophy, pink nasal mucosa, +mucoid rhinorrhea, + cobblestoning HEART: regular rate and rhythm, no murmur LUNGS: clear to auscultation bilaterally, no coughing, unlabored respiration SKIN: no rashes or lesions   Assessment/Plan  Allergic Rhinitis Allergic Conjunctivitis History of nasal polyp s/p  surgery x2 - Positive skin test 11/2021: mold and dust mites - Avoidance measures discussed. - Use nasal saline rinses before nose sprays such as with Neilmed Sinus Rinse.  Use distilled water.   - Use Flonase 2 sprays each nostril daily. Aim upward and outward. - Use Azelastine 1-2 sprays each nostril twice daily. Aim upward and outward. - - Use Ipratroprium 1-2 sprays up to four times daily as needed for runny nose. Aim upward and outward. - Use Allegra '180mg'$  daily.  - Follow up with ENT.  Concern for CRS with nasal polyps vs AFS.  Discussed possible need for biologic if nasal polyps have returned.    Penicillin Allergy - Based on your history, you are low risk. I would recommend once we figure out your sinus workup to perform a direct amoxicillin oral challenge.      Return in about 2 months (around 04/03/2022). Harlon Flor, MD  [facility]

## 2022-01-31 NOTE — Patient Instructions (Addendum)
Allergic Rhinitis Allergic Conjunctivitis History of nasal polyp s/p surgery x2 - Positive skin test 11/2021: mold and dust mites - Avoidance measures discussed. - Use nasal saline rinses before nose sprays such as with Neilmed Sinus Rinse.  Use distilled water.   - Use Flonase 2 sprays each nostril daily. Aim upward and outward. - Use Azelastine 1-2 sprays each nostril twice daily. Aim upward and outward. - - Use Ipratroprium 1-2 sprays up to four times daily as needed for runny nose. Aim upward and outward. - Use Allegra '180mg'$  daily.  - Follow up with ENT.  Penicillin Allergy - Based on your history, you are low risk. I would recommend once we figure out your sinus workup to perform a direct amoxicillin oral challenge.

## 2022-02-25 ENCOUNTER — Telehealth: Payer: Managed Care, Other (non HMO) | Admitting: Family Medicine

## 2022-02-25 DIAGNOSIS — R55 Syncope and collapse: Secondary | ICD-10-CM

## 2022-02-25 DIAGNOSIS — R6889 Other general symptoms and signs: Secondary | ICD-10-CM

## 2022-02-25 NOTE — Progress Notes (Signed)
Because flu like illness with coughing that is causing dizziness and feelings of passing out, I feel your condition warrants further evaluation and I recommend that you be seen in a face to face visit.   NOTE: There will be NO CHARGE for this eVisit

## 2022-03-07 DIAGNOSIS — J329 Chronic sinusitis, unspecified: Secondary | ICD-10-CM | POA: Insufficient documentation

## 2022-05-02 ENCOUNTER — Other Ambulatory Visit: Payer: Self-pay

## 2022-05-02 ENCOUNTER — Ambulatory Visit: Payer: Managed Care, Other (non HMO) | Admitting: Internal Medicine

## 2022-05-02 ENCOUNTER — Encounter: Payer: Self-pay | Admitting: Internal Medicine

## 2022-05-02 ENCOUNTER — Telehealth: Payer: Self-pay | Admitting: Internal Medicine

## 2022-05-02 VITALS — BP 118/76 | HR 90 | Temp 97.8°F | Resp 18 | Ht 74.02 in | Wt 290.8 lb

## 2022-05-02 DIAGNOSIS — L500 Allergic urticaria: Secondary | ICD-10-CM | POA: Diagnosis not present

## 2022-05-02 DIAGNOSIS — T360X5D Adverse effect of penicillins, subsequent encounter: Secondary | ICD-10-CM

## 2022-05-02 DIAGNOSIS — Z880 Allergy status to penicillin: Secondary | ICD-10-CM | POA: Diagnosis not present

## 2022-05-02 DIAGNOSIS — Z8709 Personal history of other diseases of the respiratory system: Secondary | ICD-10-CM | POA: Diagnosis not present

## 2022-05-02 DIAGNOSIS — J3089 Other allergic rhinitis: Secondary | ICD-10-CM

## 2022-05-02 DIAGNOSIS — Z889 Allergy status to unspecified drugs, medicaments and biological substances status: Secondary | ICD-10-CM

## 2022-05-02 MED ORDER — IPRATROPIUM BROMIDE 0.06 % NA SOLN: 1.0000 | Freq: Four times a day (QID) | NASAL | 5 refills | Status: DC | PRN

## 2022-05-02 MED ORDER — AZELASTINE HCL 0.1 % NA SOLN: 2.0000 | Freq: Two times a day (BID) | NASAL | 5 refills | Status: DC

## 2022-05-02 MED ORDER — FLUTICASONE PROPIONATE 50 MCG/ACT NA SUSP: 2.0000 | Freq: Every day | NASAL | 5 refills | Status: DC

## 2022-05-02 MED ORDER — FEXOFENADINE HCL 180 MG PO TABS: 180.0000 mg | ORAL_TABLET | Freq: Every day | ORAL | 5 refills | Status: DC | PRN

## 2022-05-02 MED ORDER — AMOXICILLIN 250 MG/5ML PO SUSR: 500.0000 mg | Freq: Every day | ORAL | 0 refills | Status: AC

## 2022-05-02 NOTE — Telephone Encounter (Signed)
Brady [name] scheduled his Amoxicillin challenge for March 20th with [facility].

## 2022-05-02 NOTE — Patient Instructions (Addendum)
Allergic Rhinitis Allergic Conjunctivitis History of nasal polyp s/p surgery x2 - Positive skin test 11/2021: mold and dust mites - Avoidance measures discussed. - Use nasal saline rinses before nose sprays such as with Neilmed Sinus Rinse.  Use distilled water.   - Use Flonase 2 sprays each nostril daily. Aim upward and outward. - Use Azelastine 1-2 sprays each nostril twice daily as needed for runny nose, congestion, drainage, sneezing. Aim upward and outward. - - Use Ipratroprium 1-2 sprays up to four times daily as needed for runny nose. Aim upward and outward. - Use Allegra '180mg'$  daily.   Penicillin Allergy - Based on your history, you are low risk. I would recommend once we figure out your sinus workup to perform a direct amoxicillin oral challenge.  Pick up the amoxicillin and bring it with you to clinic  Hold all anti histamines 3 days prior to this.

## 2022-05-02 NOTE — Progress Notes (Signed)
FOLLOW UP Date of Service/Encounter:  05/02/22   Subjective:  Brady [name] (DOB: 12-11-1970) is a 52 y.o. male who returns to the [facility] on 05/02/2022 for follow up for allergic rhinitis, history of nasal polyps s/p surgery in 2001 and 2016.    History obtained from: chart review and patient. Last visit was with me on January 31, 2022 and at that time he was doing better with the combination of Flonase, azelastine but still having a lot of postnasal drainage.  He has a history of nasal polyps so we had referred him to see ENT.     Rhinitis: Nasal Polyps: Since then, he has seen Dr. Constance Holster and underwent CT sinus that showed mild mucosal thickening in the maxillary sinuses and sphenoid ethmoid sinus and frontal sinus, no nasal polyps.  Due to the mild disease Dr. Constance Holster told him to follow-up with [place] and no need for any surgical management or Dupixent at this time.  Overall since last visit, he reports he is doing very well.  His congestion, runny nose and drainage have improved tremendously.  He uses saline rinses here and there.  Doing Flonase daily, azelastine daily as needed and ipratropium at nighttime.  Also taking Allegra once a day.  Does report having COVID in December after which he had a lot of cough, fever, chills and congestion and drainage but is back to baseline.  History of penicillin allergy Discussed again the need for amoxicillin challenge.  Symptoms occurred over 10 years ago.  He is worried as he had hives with ampicillin but does recognize that he needs to do this to avoid use of broad spectrum antibiotics.   Past Medical History: Past Medical History:  Diagnosis Date   Allergic rhinitis    Anxiety    Chronic pansinusitis    GERD (gastroesophageal reflux disease)    Hypertension    IBS (irritable bowel syndrome)    Mitral prolapse    Reflux     Objective:  BP 118/76   Pulse 90   Temp 97.8 F (36.6 C)   Resp 18   Ht 6' 2.02" (1.88 m)    Wt 290 lb 12.8 oz (131.9 kg)   SpO2 96%   BMI 37.32 kg/m  Body mass index is 37.32 kg/m. Physical Exam: GEN: alert, well developed HEENT: clear conjunctiva,  nose with moderate inferior turbinate hypertrophy, pink nasal mucosa, clear rhinorrhea, + cobblestoning HEART: regular rate and rhythm, no murmur LUNGS: clear to auscultation bilaterally, no coughing, unlabored respiration SKIN: no rashes or lesions     Assessment:   1. Perennial allergic rhinitis   2. Hx of nasal polyp   3. Urticaria due to drug allergy   4. Adverse effect of penicillin, subsequent encounter     Plan/Recommendations:   Allergic Rhinitis Allergic Conjunctivitis History of nasal polyp s/p surgery x2 - Improved with combination of INCS, INAH and OAH.  - Positive skin test 11/2021: mold and dust mites - Avoidance measures discussed. - Use nasal saline rinses before nose sprays such as with Neilmed Sinus Rinse.  Use distilled water.   - Use Flonase 2 sprays each nostril daily. Aim upward and outward. - Use Azelastine 1-2 sprays each nostril twice daily as needed for runny nose, congestion, drainage, sneezing. Aim upward and outward. - - Use Ipratroprium 1-2 sprays up to four times daily as needed for runny nose. Aim upward and outward. - Use Allegra '180mg'$  daily.  - Saw ENT 02/2022 and CT  sinus shows mild mucosal thickening but no reoccurent of polyps.   Penicillin Allergy - Based on your history, you are low risk. I would recommend once we figure out your sinus workup to perform a direct amoxicillin oral challenge.  Pick up the amoxicillin and bring it with you to clinic  Hold all anti histamines 3 days prior to this.        Return in about 6 months (around 11/02/2022).  Harlon Flor, MD [facility]

## 2022-05-17 NOTE — Progress Notes (Signed)
Brady [name], [address] [place] [address] Dept: [phone]  FOLLOW UP NOTE  Patient ID: Brady [name], male    DOB: 1970-03-20  Age: 52 y.o. MRN: [id number] Date of Office Visit: 05/18/2022  Assessment  Chief Complaint: Other (Oral challenge )  HPI Brady [name] is a 52 year old male who presents to the clinic for follow-up visit with oral amoxicillin drug challenge.  He was last seen in this clinic on 05/02/2022 by Dr. Posey Pronto for evaluation of allergic rhinitis, nasal polyposis, and penicillin allergy.  At today's visit, her reports that he is feeling well overall with no cardiopulmonary, gastrointestinal, or integumentary symptoms.  He has not had any antihistamines over the last 3 days.  He reports that when he was around 1 he was given penicillin and told that he had a rash and stopped penicillin with resolution of rash.  His current medications are listed in the chart.   Drug Allergies:  No Active Allergies   Physical Exam: BP 130/72   Pulse 74   Temp 98 F (36.7 C)   Resp 20   Ht 6\' 2"  (1.88 m)   Wt 294 lb (133.4 kg)   SpO2 96%   BMI 37.75 kg/m    Physical Exam Vitals reviewed.  Constitutional:      Appearance: Normal appearance.  HENT:     Head: Normocephalic and atraumatic.     Right Ear: Tympanic membrane normal.     Left Ear: Tympanic membrane normal.     Nose:     Comments: Bilateral nares slightly erythematous with clear nasal drainage noted. Pharynx normal. Ears normal. Eyes normal.    Mouth/Throat:     Pharynx: Oropharynx is clear.  Eyes:     Conjunctiva/sclera: Conjunctivae normal.  Cardiovascular:     Rate and Rhythm: Normal rate and regular rhythm.     Heart sounds: Normal heart sounds.  Pulmonary:     Effort: Pulmonary effort is normal.     Breath sounds: Normal breath sounds.     Comments: Lungs clear to auscultation Musculoskeletal:        General: Normal range of motion.     Cervical back: Normal range of motion and  neck supple.  Skin:    General: Skin is warm and dry.  Neurological:     Mental Status: He is alert and oriented to person, place, and time.  Psychiatric:        Mood and Affect: Mood normal.        Behavior: Behavior normal.        Thought Content: Thought content normal.        Judgment: Judgment normal.    Encouraged Procedure note: Written consent obtained Open graded amoxicillin please oral challenge: The patient was able to tolerate the challenge today without adverse signs or symptoms. Vital signs were stable throughout the challenge and observation period. He received multiple doses separated by 30 minutes, each of which was separated by vitals and a brief physical exam. He received the following doses: 1%, 10%, and 89% for a total of 875 mg amoxicillin. He was monitored for 60 minutes following the last dose.  Total testing time:   The patient was able to tolerate the open graded oral drug challenge today without adverse signs or symptoms. Therefore, he has the same risk of systemic reaction associated with  the use of amoxicillin as the general population.    Assessment and Plan: 1. Drug allergy     Patient Instructions  In office oral amoxicillin drug challenge Brady [name] was able to tolerate the amoxicillin drug challenge today at the office without adverse signs or symptoms of an allergic reaction. Therefore, he has the same risk of systemic reaction associated with the use of amoxicillin as the general population.  - Do not give any amoxicillin or penicillin containing medications for the next 24 hours. - Monitor for allergic symptoms such as rash, wheezing, diarrhea, swelling, and vomiting for the next 24 hours. If severe symptoms occur, call 911. For less severe symptoms treat with Benadryl 50 mg every 4 hours and call the clinic.   Call the clinic if this treatment plan is not working well for you   Follow up at your scheduled appointment on 11/07/2022 or  sooner if needed.  Return in about 6 months (around 11/07/2022), or if symptoms worsen or fail to improve.    Thank you for the opportunity to care for this patient.  Please do not hesitate to contact me with questions.  Brady Morgan, FNP [facility]

## 2022-05-17 NOTE — Patient Instructions (Incomplete)
In office oral amoxicillin drug challenge Brady [name] was able to tolerate the amoxicillin drug challenge today at the office without adverse signs or symptoms of an allergic reaction. Therefore, he has the same risk of systemic reaction associated with the use of amoxicillin as the general population.  - Do not give any amoxicillin or penicillin containing medications for the next 24 hours. - Monitor for allergic symptoms such as rash, wheezing, diarrhea, swelling, and vomiting for the next 24 hours. If severe symptoms occur, call 911. For less severe symptoms treat with Benadryl 50 mg every 4 hours and call the clinic.   Call the clinic if this treatment plan is not working well for you   Follow up at your scheduled appointment on 11/07/2022 or sooner if needed.

## 2022-05-18 ENCOUNTER — Other Ambulatory Visit: Payer: Self-pay

## 2022-05-18 ENCOUNTER — Ambulatory Visit: Payer: Managed Care, Other (non HMO) | Admitting: Family Medicine

## 2022-05-18 ENCOUNTER — Encounter: Payer: Self-pay | Admitting: Family Medicine

## 2022-05-18 VITALS — BP 130/72 | HR 74 | Temp 98.0°F | Resp 20 | Ht 74.0 in | Wt 294.0 lb

## 2022-05-18 DIAGNOSIS — Z889 Allergy status to unspecified drugs, medicaments and biological substances status: Secondary | ICD-10-CM | POA: Diagnosis not present

## 2022-07-14 ENCOUNTER — Telehealth: Payer: Managed Care, Other (non HMO) | Admitting: Physician Assistant

## 2022-07-14 DIAGNOSIS — B9689 Other specified bacterial agents as the cause of diseases classified elsewhere: Secondary | ICD-10-CM

## 2022-07-14 DIAGNOSIS — J019 Acute sinusitis, unspecified: Secondary | ICD-10-CM | POA: Diagnosis not present

## 2022-07-14 MED ORDER — DOXYCYCLINE HYCLATE 100 MG PO TABS: 100.0000 mg | ORAL_TABLET | Freq: Two times a day (BID) | ORAL | 0 refills | Status: DC

## 2022-07-14 NOTE — Progress Notes (Signed)
E-Visit for Sinus Problems  We are sorry that you are not feeling well.  Here is how we plan to help!  Based on what you have shared with me it looks like you have sinusitis.  Sinusitis is inflammation and infection in the sinus cavities of the head.  Based on your presentation I believe you most likely have Acute Bacterial Sinusitis.  This is an infection caused by bacteria and is treated with antibiotics. I have prescribed Doxycycline 100mg by mouth twice a day for 10 days. You may use an oral decongestant such as Mucinex D or if you have glaucoma or high blood pressure use plain Mucinex. Saline nasal spray help and can safely be used as often as needed for congestion.  If you develop worsening sinus pain, fever or notice severe headache and vision changes, or if symptoms are not better after completion of antibiotic, please schedule an appointment with a health care provider.    Sinus infections are not as easily transmitted as other respiratory infection, however we still recommend that you avoid close contact with loved ones, especially the very young and elderly.  Remember to wash your hands thoroughly throughout the day as this is the number one way to prevent the spread of infection!  Home Care: Only take medications as instructed by your medical team. Complete the entire course of an antibiotic. Do not take these medications with alcohol. A steam or ultrasonic humidifier can help congestion.  You can place a towel over your head and breathe in the steam from hot water coming from a faucet. Avoid close contacts especially the very young and the elderly. Cover your mouth when you cough or sneeze. Always remember to wash your hands.  Get Help Right Away If: You develop worsening fever or sinus pain. You develop a severe head ache or visual changes. Your symptoms persist after you have completed your treatment plan.  Make sure you Understand these instructions. Will watch your  condition. Will get help right away if you are not doing well or get worse.  Thank you for choosing an e-visit.  Your e-visit answers were reviewed by a board certified advanced clinical practitioner to complete your personal care plan. Depending upon the condition, your plan could have included both over the counter or prescription medications.  Please review your pharmacy choice. Make sure the pharmacy is open so you can pick up prescription now. If there is a problem, you may contact your provider through MyChart messaging and have the prescription routed to another pharmacy.  Your safety is important to us. If you have drug allergies check your prescription carefully.   For the next 24 hours you can use MyChart to ask questions about today's visit, request a non-urgent call back, or ask for a work or school excuse. You will get an email in the next two days asking about your experience. I hope that your e-visit has been valuable and will speed your recovery.  I have spent 5 minutes in review of e-visit questionnaire, review and updating patient chart, medical decision making and response to patient.   Shannon Kirkendall M Martika Egler, PA-C  

## 2022-07-29 ENCOUNTER — Other Ambulatory Visit: Payer: Self-pay

## 2022-07-29 ENCOUNTER — Encounter (HOSPITAL_COMMUNITY): Payer: Self-pay

## 2022-07-29 ENCOUNTER — Emergency Department (HOSPITAL_COMMUNITY)
Admission: EM | Admit: 2022-07-29 | Discharge: 2022-07-29 | Disposition: A | Discharge status: Home / Self Care | Payer: Managed Care, Other (non HMO) | Attending: Emergency Medicine | Admitting: Emergency Medicine

## 2022-07-29 DIAGNOSIS — W458XXA Other foreign body or object entering through skin, initial encounter: Secondary | ICD-10-CM | POA: Insufficient documentation

## 2022-07-29 DIAGNOSIS — S0005XA Superficial foreign body of scalp, initial encounter: Secondary | ICD-10-CM | POA: Diagnosis present

## 2022-07-29 DIAGNOSIS — S0990XA Unspecified injury of head, initial encounter: Secondary | ICD-10-CM

## 2022-07-29 DIAGNOSIS — Y9319 Activity, other involving water and watercraft: Secondary | ICD-10-CM | POA: Insufficient documentation

## 2022-07-29 MED ORDER — CEPHALEXIN 500 MG PO CAPS: 500.0000 mg | ORAL_CAPSULE | Freq: Two times a day (BID) | ORAL | 0 refills | Status: AC

## 2022-07-29 MED ORDER — LIDOCAINE-EPINEPHRINE (PF) 2 %-1:200000 IJ SOLN
10.0000 mL | Freq: Once | INTRAMUSCULAR | Status: AC
  Administered 2022-07-29: 10 mL
  Filled 2022-07-29: qty 20

## 2022-07-29 NOTE — Discharge Instructions (Addendum)
You have been seen today for your complaint of fishhook embedment. Your discharge medications include Keflex. This is an antibiotic. You should take it as prescribed. You should take it for the entire duration of the prescription. This may cause an upset stomach. This is normal. You may take this with food. You may also eat yogurt to prevent diarrhea. Follow up with: Primary care provider as needed Please seek immediate medical care if you develop any of the following symptoms: You have sudden: Headache that is very bad. Vomiting that does not stop. Changes in the size of one of your pupils. Pupils are the black centers of your eyes. Changes in how you see (vision). More confusion or more grumpy moods. You have a seizure. Your symptoms get worse. You have a clear or bloody fluid coming from your nose or ears. At this time there does not appear to be the presence of an emergent medical condition, however there is always the potential for conditions to change. Please read and follow the below instructions.  Do not take your medicine if  develop an itchy rash, swelling in your mouth or lips, or difficulty breathing; call 911 and seek immediate emergency medical attention if this occurs.  You may review your lab tests and imaging results in their entirety on your MyChart account.  Please discuss all results of fully with your primary care provider and other specialist at your follow-up visit.  Note: Portions of this text may have been transcribed using voice recognition software. Every effort was made to ensure accuracy; however, inadvertent computerized transcription errors may still be present.

## 2022-07-29 NOTE — ED Triage Notes (Signed)
Pt arrived via POV from home. Pt was fishing and went to get his lure from being stuck in a tree. The lure then lodged itself in the Pts right scalp. The lure penetrated the Pts hat and one of the three barbed hooks is embedded in the Pts skin. Tetnus shot last updated in September, 2023.

## 2022-07-29 NOTE — ED Provider Notes (Signed)
[place] EMERGENCY DEPARTMENT AT [facility] Provider Note   CSN: [id number] Arrival date & time: 07/29/22  1930     History  Chief Complaint  Patient presents with   Foreign Body    Brady [name] is a 52 y.o. male.  Who presents to the ED for evaluation of fishhook embedded in his scalp.  He was fishing approximate 1 hour prior to arrival when his liver got caught in a tree.  When he pulled the lower out, it flew back.  He tried to duct but it still hit him in the head.  Hook went through his at and embedded into his scalp Posterior crown of his head.  He tried to remove it but was unable.  He denies any numbness, weakness or tingling.  Tetanus is up-to-date.   Foreign Body      Home Medications Prior to Admission medications   Medication Sig Start Date End Date Taking? Authorizing Provider  amLODipine (NORVASC) 10 MG tablet TAKE 1 TABLET BY MOUTH EVERY DAY 01/24/22   Everlene Other G, DO  azelastine (ASTELIN) 0.1 % nasal spray Place 2 sprays into both nostrils 2 (two) times daily. Use in each nostril as directed 05/02/22   Birder Robson, MD  cholecalciferol (VITAMIN D) 1000 UNITS tablet Take 1,000 Units by mouth daily.    [provider]  doxycycline (VIBRA-TABS) 100 MG tablet Take 1 tablet (100 mg total) by mouth 2 (two) times daily. 07/14/22   Margaretann Loveless, PA-C  ECHINACEA PO Take 1 tablet by mouth daily.    [provider]  fexofenadine (ALLEGRA) 180 MG tablet Take 1 tablet (180 mg total) by mouth daily as needed for allergies or rhinitis (Can take an extra dose during flare ups.). 05/02/22   Patel, Ellen Henri, MD  Flaxseed, Linseed, (FLAX SEED OIL PO) Take 1 capsule by mouth daily.    [provider]  fluticasone (FLONASE) 50 MCG/ACT nasal spray Place 2 sprays into both nostrils daily. 05/02/22   Birder Robson, MD  Ginger, Zingiber officinalis, (GINGER PO) Take 1 capsule by mouth daily.    [provider]  Glucosamine HCl  (GLUCOSAMINE PO) Take 2 tablets by mouth daily.     [provider]  hydrochlorothiazide (HYDRODIURIL) 25 MG tablet TAKE 1 TABLET (25 MG TOTAL) BY MOUTH DAILY. 01/24/22   Everlene Other G, DO  ipratropium (ATROVENT) 0.06 % nasal spray Place 1 spray into both nostrils 4 (four) times daily as needed for rhinitis. 05/02/22   Birder Robson, MD  KRILL OIL PO Take 1 capsule by mouth daily.    [provider]  Multiple Vitamin (MULTIVITAMIN WITH MINERALS) TABS tablet Take 1 tablet by mouth daily.    [provider]  podofilox (CONDYLOX) 0.5 % external solution Apply topically every in the morning and evening for 3 days, then withhold for 4 days; repeat cycle up to 4 times Patient not taking: Reported on 05/18/2022 10/11/21   Tommie Sams, DO  POTASSIUM PO Take 1 tablet by mouth daily.     [provider]  TURMERIC PO Take 1 tablet by mouth daily.    [provider]  vitamin C (ASCORBIC ACID) 500 MG tablet Take 500 mg by mouth daily.    [provider]      Allergies    Patient has no known allergies.    Review of Systems   Review of Systems  Skin:  Positive for wound.  All  other systems reviewed and are negative.   Physical Exam Updated Vital Signs BP (!) 145/89   Pulse 87   Temp 98.7 F (37.1 C) (Oral)   Resp 20   Ht 6\' 2"  (1.88 m)   Wt 127 kg   SpO2 99%   BMI 35.95 kg/m  Physical Exam Vitals and nursing note reviewed.  Constitutional:      General: He is not in acute distress.    Appearance: He is well-developed.  HENT:     Head: Normocephalic and atraumatic.  Eyes:     Conjunctiva/sclera: Conjunctivae normal.  Cardiovascular:     Rate and Rhythm: Normal rate and regular rhythm.     Heart sounds: No murmur heard. Pulmonary:     Effort: Pulmonary effort is normal. No respiratory distress.     Breath sounds: Normal breath sounds.  Abdominal:     Palpations: Abdomen is soft.     Tenderness: There is no abdominal tenderness.   Musculoskeletal:        General: No swelling.     Cervical back: Neck supple.  Skin:    General: Skin is warm and dry.     Capillary Refill: Capillary refill takes less than 2 seconds.     Comments: Barbed hook embedded in the posterior crown of the scalp  Neurological:     Mental Status: He is alert.  Psychiatric:        Mood and Affect: Mood normal.     ED Results / Procedures / Treatments   Labs (all labs ordered are listed, but only abnormal results are displayed) Labs Reviewed - No data to display  EKG None  Radiology No results found.  Procedures .Foreign Body Removal  Date/Time: 07/29/2022 9:11 PM  Performed by: Michelle Piper, PA-C Authorized by: Michelle Piper, PA-C  Consent: Verbal consent obtained. Risks and benefits: risks, benefits and alternatives were discussed Consent given by: patient Patient understanding: patient states understanding of the procedure being performed Patient consent: the patient's understanding of the procedure matches consent given Patient identity confirmed: verbally with patient and arm band Time out: Immediately prior to procedure a "time out" was called to verify the correct patient, procedure, equipment, support staff and site/side marked as required. Body area: skin General location: head/neck Location details: scalp Anesthesia: local infiltration  Anesthesia: Local Anesthetic: lidocaine 2% with epinephrine Anesthetic total: 1 mL 1 objects recovered. Objects recovered: fish hook Post-procedure assessment: foreign body removed Patient tolerance: patient tolerated the procedure well with no immediate complications      Medications Ordered in ED Medications  lidocaine-EPINEPHrine (XYLOCAINE W/EPI) 2 %-1:200000 (PF) injection 10 mL (10 mLs Infiltration Given 07/29/22 2104)    ED Course/ Medical Decision Making/ A&P                             Medical Decision Making Risk Prescription drug  management.  This patient presents to the ED for concern of fish hook embedded, this involves an extensive number of treatment options  My initial workup includes fishhook removal please  Additional history obtained from: Nursing notes from this visit.  Afebrile, hemodynamically stable.  52 year old male presents to the ED for evaluation of a retained foreign body.  He has a fishhook embedded in his scalp.  This was removed by advance and cut.  Patient tolerated the procedure well.  No active bleeding.  Wound was cleaned after foreign body removal.  Tetanus is up-to-date.  He will be started on a short course of prophylactic antibiotics.  He was given return precautions.  Stable at discharge.  At this time there does not appear to be any evidence of an acute emergency medical condition and the patient appears stable for discharge with appropriate outpatient follow up. Diagnosis was discussed with patient who verbalizes understanding of care plan and is agreeable to discharge. I have discussed return precautions with patient who verbalizes understanding. Patient encouraged to follow-up with their PCP as needed. All questions answered.  Note: Portions of this report may have been transcribed using voice recognition software. Every effort was made to ensure accuracy; however, inadvertent computerized transcription errors may still be present.        Final Clinical Impression(s) / ED Diagnoses Final diagnoses:  Fish hook in scalp    Rx / DC Orders ED Discharge Orders     None         Mora Bellman 07/29/22 2115    Vanetta Mulders, MD 07/30/22 [phone]

## 2022-09-11 ENCOUNTER — Other Ambulatory Visit: Payer: Self-pay | Admitting: Family Medicine

## 2022-09-11 DIAGNOSIS — I10 Essential (primary) hypertension: Secondary | ICD-10-CM

## 2022-10-08 ENCOUNTER — Other Ambulatory Visit: Payer: Self-pay | Admitting: Family Medicine

## 2022-10-08 DIAGNOSIS — I10 Essential (primary) hypertension: Secondary | ICD-10-CM

## 2022-10-19 ENCOUNTER — Telehealth (INDEPENDENT_AMBULATORY_CARE_PROVIDER_SITE_OTHER): Payer: Managed Care, Other (non HMO) | Admitting: Family Medicine

## 2022-10-19 DIAGNOSIS — E782 Mixed hyperlipidemia: Secondary | ICD-10-CM | POA: Diagnosis not present

## 2022-10-19 DIAGNOSIS — Z125 Encounter for screening for malignant neoplasm of prostate: Secondary | ICD-10-CM

## 2022-10-19 DIAGNOSIS — Z130 Encounter for screening for diseases of the blood and blood-forming organs and certain disorders involving the immune mechanism: Secondary | ICD-10-CM

## 2022-10-19 DIAGNOSIS — I10 Essential (primary) hypertension: Secondary | ICD-10-CM

## 2022-10-19 DIAGNOSIS — Z114 Encounter for screening for human immunodeficiency virus [HIV]: Secondary | ICD-10-CM

## 2022-10-19 DIAGNOSIS — Z1159 Encounter for screening for other viral diseases: Secondary | ICD-10-CM

## 2022-10-19 MED ORDER — HYDROCHLOROTHIAZIDE 25 MG PO TABS
25.0000 mg | ORAL_TABLET | Freq: Every day | ORAL | 3 refills | Status: DC
Start: 2022-10-19 — End: 2023-05-31

## 2022-10-19 MED ORDER — AMLODIPINE BESYLATE 10 MG PO TABS: 10.0000 mg | ORAL_TABLET | Freq: Every day | ORAL | 3 refills | Status: DC

## 2022-10-19 NOTE — Progress Notes (Signed)
Virtual Visit via Video Note  I connected with Brady [name] on 10/19/22 at  8:40 AM EDT by a video enabled telemedicine application and verified that I am speaking with the correct person using two identifiers.  Location: Patient: Home Provider: Home   I discussed the limitations of evaluation and management by telemedicine and the availability of in person appointments. The patient expressed understanding and agreed to proceed.  History of Present Illness:  52 year old male with hypertension, obesity, hyperlipidemia presents for follow-up.  Patient reports that overall he is doing well.  He checked his blood pressure this morning it was 140 systolic.  States that he took it after drinking coffee.  He endorses compliance with his medication.  Needs medication refills.  Patient is due for labs.  Discussed his preventative health care.  He is amenable to HIV and hepatitis C screening.  Declines shingles vaccine.  States he will get his flu vaccine at [facility].  Patient continues to have difficulty with genital warts.  States that it has not responded to topical treatment.    Observations/Objective: General: Well-appearing no acute distress. Respiratory: Speaking full sentences.  No apparent respiratory distress.  Assessment and Plan: Hypertension Stable.  Continue amlodipine and HCTZ. Medications refilled today. Labs ordered.  Genital warts Referring to specialist; unsure of whether it is dermatology or general surgery in our area.   Preventative health care Labs ordered including hepatitis C and HIV. Declines shingles vaccine. Will get Flu vaccine at his pharmacy  Meds ordered this encounter  Medications   amLODipine (NORVASC) 10 MG tablet    Sig: Take 1 tablet (10 mg total) by mouth daily.    Dispense:  90 tablet    Refill:  3   hydrochlorothiazide (HYDRODIURIL) 25 MG tablet    Sig: Take 1 tablet (25 mg total) by mouth daily.    Dispense:  90 tablet    Refill:  3    Orders Placed This Encounter  Procedures   CBC   CMP14+EGFR   Lipid panel   Microalbumin / creatinine urine ratio   PSA   HIV antibody (with reflex)   Hepatitis C Antibody    Follow Up Instructions:    I discussed the assessment and treatment plan with the patient. The patient was provided an opportunity to ask questions and all were answered. The patient agreed with the plan and demonstrated an understanding of the instructions.   The patient was advised to call back or seek an in-person evaluation if the symptoms worsen or if the condition fails to improve as anticipated.  I provided 20 minutes of non-face-to-face time during this encounter.   Tommie Sams, DO

## 2022-10-29 IMAGING — CT CT HEART MORP W/ CTA COR W/ SCORE W/ CA W/CM &/OR W/O CM
1 series · 12 of 14 positions shown, 15 images · non-contrast
Comparison: None.
COMPARISON: None.

Addendum:
EXAM:
OVER-READ INTERPRETATION  CT CHEST

The following report is an over-read performed by radiologist Dr.
Julio C Deane [[email or web address]] on 04/22/2020. This
over-read does not include interpretation of cardiac or coronary
anatomy or pathology. The coronary calcium score/coronary CTA
interpretation by the cardiologist is attached.
CLINICAL DATA: Chest pain
Cardiac/Coronary CTA
TECHNIQUE: The patient was scanned on a Phillips Force scanner. A 120 kV
prospective scan was triggered in the descending thoracic aorta at
111 HU's. Axial non-contrast 3 mm slices were carried out through
the heart. The data set was analyzed on a dedicated work station and
scored using the Agatson method. Gantry rotation speed was 250 msecs
and collimation was .6 mm. No beta blockade and 0.8 mg of sl NTG was
given. The 3D data set was reconstructed in 5% intervals of the
35-75 % of the R-R cycle. Diastolic phases were analyzed on a
dedicated work station using MPR, MIP and VRT modes. The patient
received 80 cc of contrast.

[Series 803: findings · 12 of 14 slices shown, 15 images]
[im 2/14  vessel]
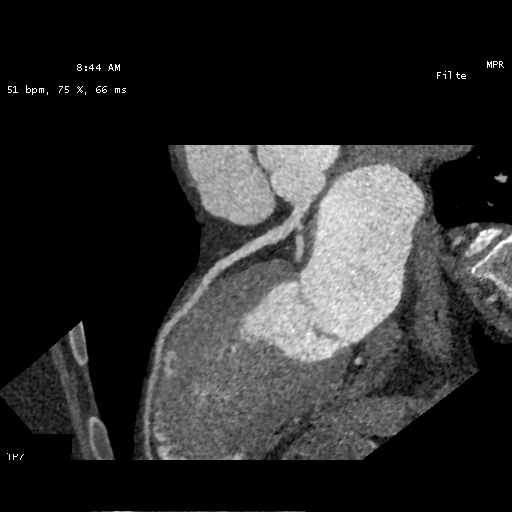
[im 2/14  lung]
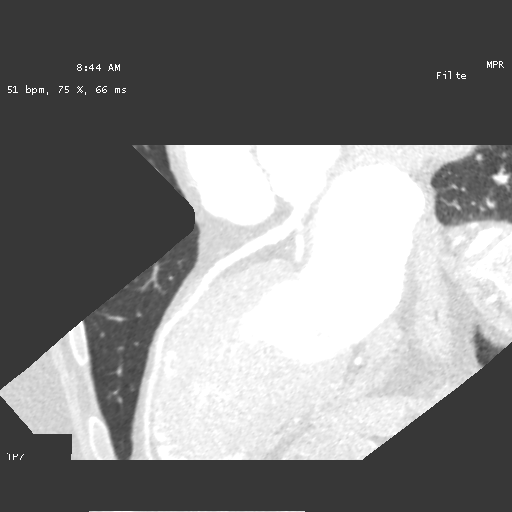
[im 3/14  vessel]
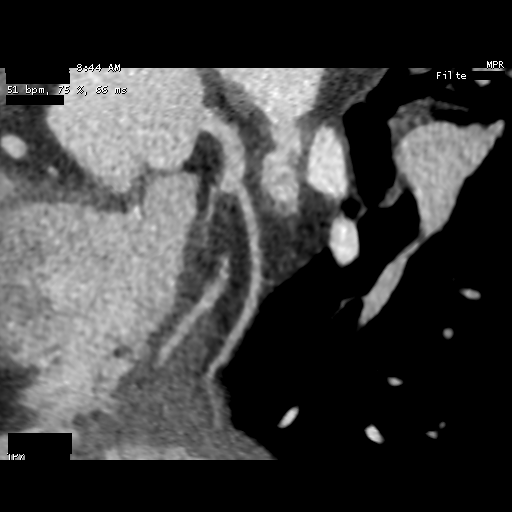
[im 4/14  vessel]
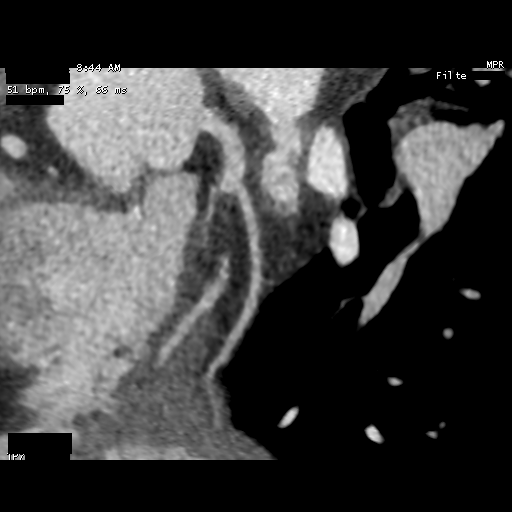
[im 5/14  vessel]
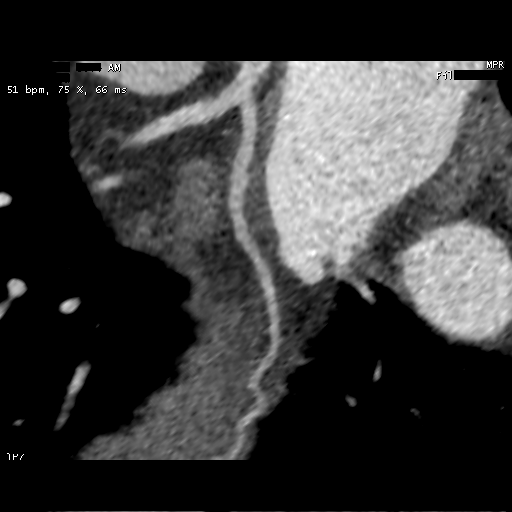
[im 6/14  vessel]
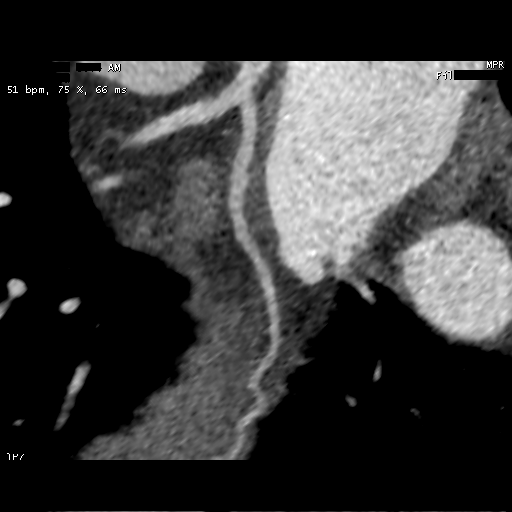
[im 6/14  lung]
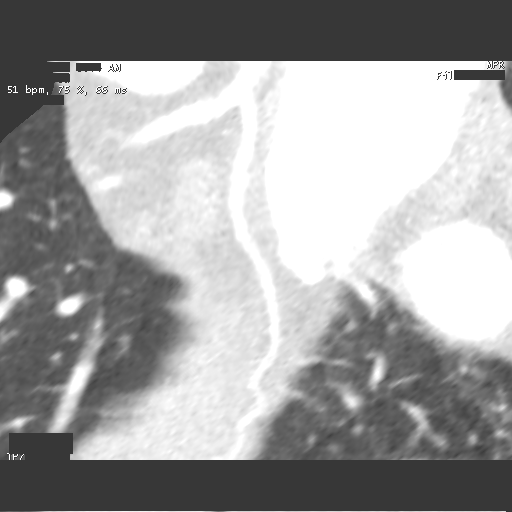
[im 7/14  vessel]
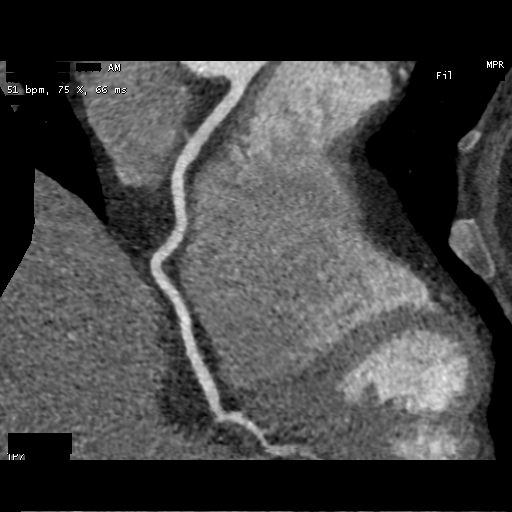
[im 8/14  vessel]
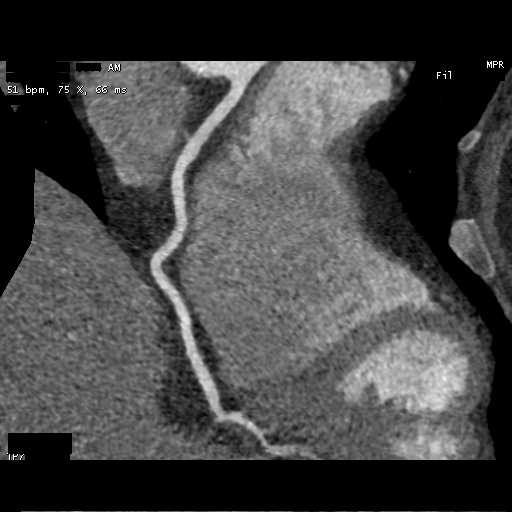
[im 9/14  vessel]
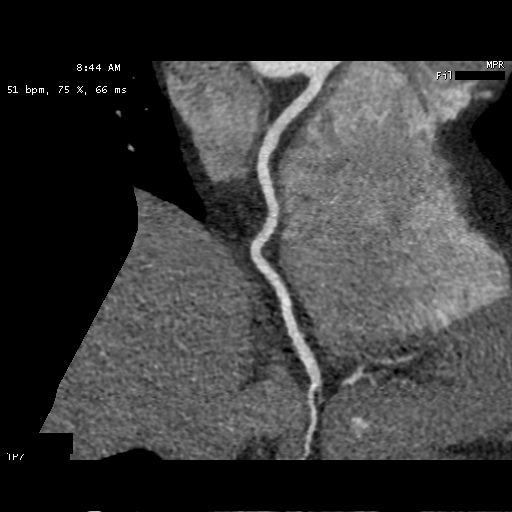
[im 10/14  vessel]
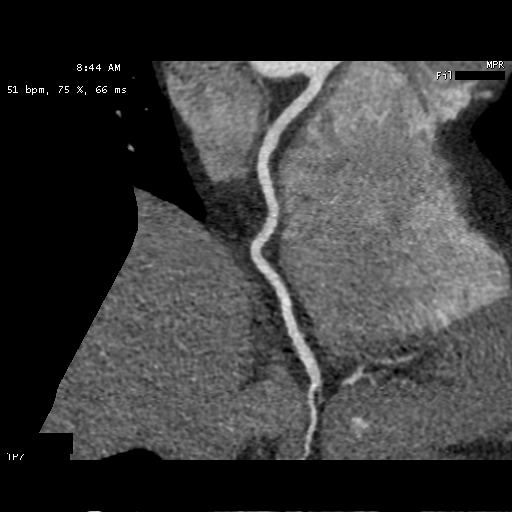
[im 10/14  lung]
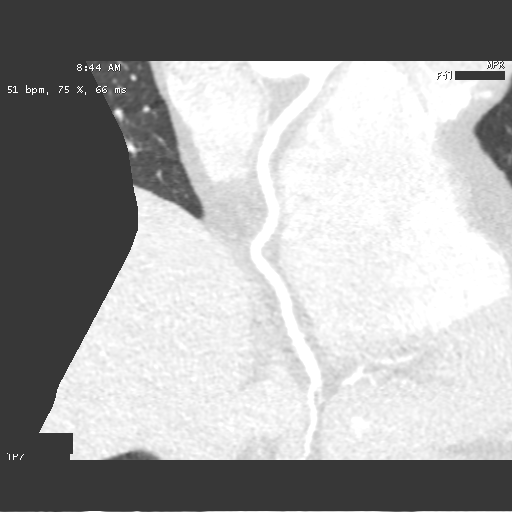
[im 11/14  vessel]
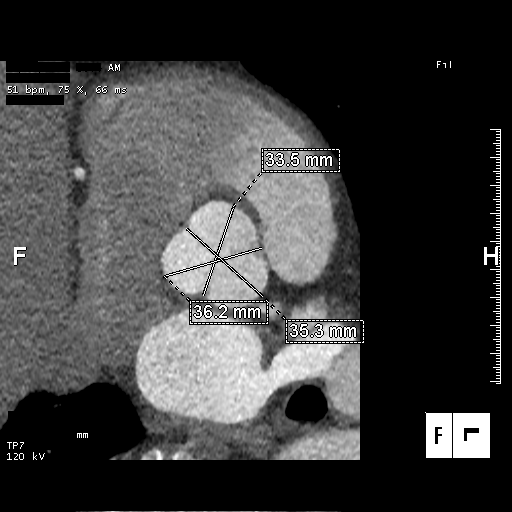
[im 12/14  vessel]
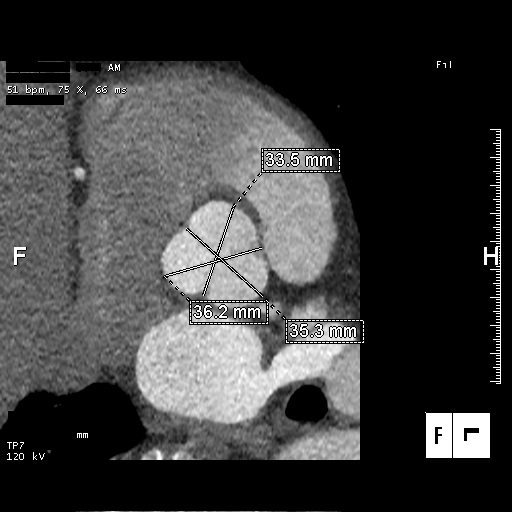
[im 13/14  vessel]
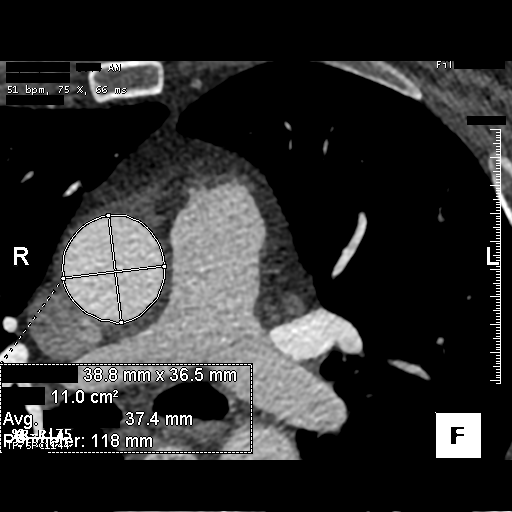

[12 of 14 positions shown; findings below may reference images not displayed]

FINDINGS: Within the visualized portions of the thorax there are no suspicious
appearing pulmonary nodules or masses, there is no acute
consolidative airspace disease, no pleural effusions, no
pneumothorax and no lymphadenopathy. Visualized portions of the
upper abdomen are unremarkable. There are no aggressive appearing
lytic or blastic lesions noted in the visualized portions of the
skeleton.
IMPRESSION: No significant incidental noncardiac findings are noted.
FINDINGS: Image quality: excellent.

Noise artifact is: Limited.

Coronary Arteries:  Normal coronary origin.  Right dominance.

Left main: The left main is a large caliber vessel with a normal
take off from the left coronary cusp that bifurcates to form a left
anterior descending artery and a left circumflex artery. There is no
plaque or stenosis.

Left anterior descending artery: The LAD is patent without evidence
of plaque or stenosis. A mid LAD myocardial bridge is present. The
LAD gives off 2 patent diagonal branches.

Left circumflex artery: The LCX is non-dominant and patent with no
evidence of plaque or stenosis. The LCX gives off 2 patent obtuse
marginal branches.

Right coronary artery: The RCA is dominant with normal take off from
the right coronary cusp. There is no evidence of plaque or stenosis.
The RCA terminates as a PDA and right posterolateral branch without
evidence of plaque or stenosis.

Right Atrium: Right atrial size is within normal limits.

Right Ventricle: The right ventricular cavity is within normal
limits.

Left Atrium: Left atrial size is normal in size with no left atrial
appendage filling defect.

Left Ventricle: The ventricular cavity size is within normal limits.
There are no stigmata of prior infarction. There is no abnormal
filling defect.

Pulmonary arteries: Normal in size without proximal filling defect.

Pulmonary veins: Normal pulmonary venous drainage.

Pericardium: Normal thickness with no significant effusion or
calcium present.

Cardiac valves: The aortic valve is trileaflet without significant
calcification. The mitral valve is normal structure without
significant calcification.

Aorta: Normal caliber with no significant disease.

Extra-cardiac findings: See attached radiology report for
non-cardiac structures.
IMPRESSION: 1. Coronary calcium score of 0.

2. Normal coronary origin with right dominance.

3. Normal coronary arteries.

4. Mid LAD myocardial bridge (normal variant).

RECOMMENDATIONS:
1. No evidence of CAD (0%). Consider non-atherosclerotic causes of
chest pain.

*** End of Addendum ***
EXAM:
OVER-READ INTERPRETATION  CT CHEST

The following report is an over-read performed by radiologist Dr.
Julio C Deane [[email or web address]] on 04/22/2020. This
over-read does not include interpretation of cardiac or coronary
anatomy or pathology. The coronary calcium score/coronary CTA
interpretation by the cardiologist is attached.
FINDINGS: Within the visualized portions of the thorax there are no suspicious
appearing pulmonary nodules or masses, there is no acute
consolidative airspace disease, no pleural effusions, no
pneumothorax and no lymphadenopathy. Visualized portions of the
upper abdomen are unremarkable. There are no aggressive appearing
lytic or blastic lesions noted in the visualized portions of the
skeleton.
IMPRESSION: No significant incidental noncardiac findings are noted.

## 2022-11-07 ENCOUNTER — Ambulatory Visit: Payer: Managed Care, Other (non HMO) | Admitting: Internal Medicine

## 2022-11-07 ENCOUNTER — Other Ambulatory Visit: Payer: Self-pay

## 2022-11-07 VITALS — BP 118/72 | HR 81 | Temp 98.6°F | Resp 16 | Ht 73.62 in | Wt 287.2 lb

## 2022-11-07 DIAGNOSIS — H1013 Acute atopic conjunctivitis, bilateral: Secondary | ICD-10-CM | POA: Diagnosis not present

## 2022-11-07 DIAGNOSIS — Z8709 Personal history of other diseases of the respiratory system: Secondary | ICD-10-CM

## 2022-11-07 DIAGNOSIS — J3089 Other allergic rhinitis: Secondary | ICD-10-CM

## 2022-11-07 DIAGNOSIS — R0982 Postnasal drip: Secondary | ICD-10-CM

## 2022-11-07 MED ORDER — FEXOFENADINE HCL 180 MG PO TABS: 180.0000 mg | ORAL_TABLET | Freq: Every day | ORAL | 1 refills | Status: DC | PRN

## 2022-11-07 MED ORDER — AZELASTINE HCL 0.1 % NA SOLN: 2.0000 | Freq: Two times a day (BID) | NASAL | 1 refills | Status: DC

## 2022-11-07 MED ORDER — IPRATROPIUM BROMIDE 0.06 % NA SOLN: 1.0000 | Freq: Four times a day (QID) | NASAL | 1 refills | Status: DC | PRN

## 2022-11-07 MED ORDER — FLUTICASONE PROPIONATE 50 MCG/ACT NA SUSP: 2.0000 | Freq: Every day | NASAL | 5 refills | Status: DC

## 2022-11-07 NOTE — Progress Notes (Signed)
   FOLLOW UP Date of Service/Encounter:  11/07/22   Subjective:  Brady [name] (DOB: 12-Apr-1970) is a 52 y.o. male who [facility] for follow up for allergic rhinoconjunctivitis/nasal polyps.   History obtained from: chart review and patient. At last visit, he saw Thermon Leyland for amoxicillin challenge and successfully completed it.  Allergy to penicillin was removed from chart.    Since last visit, he is still having trouble with congestion, drainage especially post nasal drip. It is throughout the day. Sometimes also has it with spicy foods and eating.  Using Flonase daily, Azelastine BID and Ipratroprium PRN with Allegra daily.  This does help but still has not completely controlled his symptoms.  He is interested in AIT but has an upcoming switch in insurance.    Past Medical History: Past Medical History:  Diagnosis Date   Allergic rhinitis    Anxiety    Chronic pansinusitis    GERD (gastroesophageal reflux disease)    Hypertension    IBS (irritable bowel syndrome)    Mitral prolapse    Reflux     Objective:  BP 118/72   Pulse 81   Temp 98.6 F (37 C) (Temporal)   Resp 16   Ht 6' 1.62" (1.87 m)   Wt 287 lb 3.2 oz (130.3 kg)   SpO2 97%   BMI 37.25 kg/m  Body mass index is 37.25 kg/m. Physical Exam: GEN: alert, well developed HEENT: clear conjunctiva, TM grey and translucent, nose with moderate inferior turbinate hypertrophy, pink nasal mucosa, clear rhinorrhea, slight cobblestoning HEART: regular rate and rhythm, no murmur LUNGS: clear to auscultation bilaterally, no coughing, unlabored respiration SKIN: no rashes or lesions  Assessment:   1. Perennial allergic rhinitis   2. Allergic conjunctivitis of both eyes   3. Hx of nasal polyp   4. Post-nasal drip     Plan/Recommendations:  Allergic Rhinitis Allergic Conjunctivitis History of nasal polyp s/p surgery x2 - Uncontrolled despite maximal medical therapy;  consider AIT.  - Positive skin test 11/2021: mold and dust mites - Avoidance measures discussed. - Use nasal saline rinses before nose sprays such as with Neilmed Sinus Rinse.  Use distilled water.   - Use Flonase 2 sprays each nostril daily. Aim upward and outward. - Use Azelastine 1-2 sprays each nostril twice daily as needed for runny nose, congestion, drainage, sneezing. Aim upward and outward. - Use Ipratroprium 1-2 sprays up to four times daily as needed for runny nose. Aim upward and outward. - Use Allegra 180mg  daily.  - Consider allergy shots.  Given information for one shot vial to discuss with insurance company.  If interested, please call us back and we will get the vials mixed and Epipen sent.     Return in about 6 months (around 05/07/2023).  Alesia Morin, MD [facility]

## 2022-11-07 NOTE — Patient Instructions (Addendum)
Allergic Rhinitis Allergic Conjunctivitis History of nasal polyp s/p surgery x2 - Positive skin test 11/2021: mold and dust mites - Avoidance measures discussed. - Use nasal saline rinses before nose sprays such as with Neilmed Sinus Rinse.  Use distilled water.   - Use Flonase 2 sprays each nostril daily. Aim upward and outward. - Use Azelastine 1-2 sprays each nostril twice daily as needed for runny nose, congestion, drainage, sneezing. Aim upward and outward. - Use Ipratroprium 1-2 sprays up to four times daily as needed for runny nose. Aim upward and outward. - Use Allegra 180mg  daily.  - Consider allergy shots.  Given information for one shot vial to discuss with insurance company.  If interested, please call us back and we will get the vials mixed and Epipen sent.

## 2022-11-12 LAB — CMP14+EGFR
ALT: 21 IU/L (ref 0–44)
AST: 17 IU/L (ref 0–40)
Albumin: 4.4 g/dL (ref 3.8–4.9)
Alkaline Phosphatase: 80 IU/L (ref 44–121)
BUN/Creatinine Ratio: 12 (ref 9–20)
BUN: 13 mg/dL (ref 6–24)
Bilirubin Total: 0.5 mg/dL (ref 0.0–1.2)
CO2: 24 mmol/L (ref 20–29)
Calcium: 9.7 mg/dL (ref 8.7–10.2)
Chloride: 100 mmol/L (ref 96–106)
Creatinine, Ser: 1.05 mg/dL (ref 0.76–1.27)
Globulin, Total: 2.5 g/dL (ref 1.5–4.5)
Glucose: 94 mg/dL (ref 70–99)
Potassium: 4.1 mmol/L (ref 3.5–5.2)
Sodium: 140 mmol/L (ref 134–144)
Total Protein: 6.9 g/dL (ref 6.0–8.5)
eGFR: 84 mL/min/{1.73_m2} (ref >59)

## 2022-11-12 LAB — CBC
Hematocrit: 48.5 % (ref 37.5–51.0)
Hemoglobin: 15.6 g/dL (ref 13.0–17.7)
MCH: 27.3 pg (ref 26.6–33.0)
MCHC: 32.2 g/dL (ref 31.5–35.7)
MCV: 85 fL (ref 79–97)
Platelets: 296 10*3/uL (ref 150–450)
RBC: 5.72 x10E6/uL (ref 4.14–5.80)
RDW: 14 % (ref 11.6–15.4)
WBC: 7.6 10*3/uL (ref 3.4–10.8)

## 2022-11-12 LAB — MICROALBUMIN / CREATININE URINE RATIO
Creatinine, Urine: 88.4 mg/dL
Microalb/Creat Ratio: <3 mg/g{creat} (ref 0–29)
Microalbumin, Urine: <3 ug/mL

## 2022-11-12 LAB — LIPID PANEL
Chol/HDL Ratio: 4.9 ratio (ref 0.0–5.0)
Cholesterol, Total: 197 mg/dL (ref 100–199)
HDL: 40 mg/dL (ref >39)
LDL Chol Calc (NIH): 125 mg/dL — ABNORMAL HIGH (ref 0–99)
Triglycerides: 179 mg/dL — ABNORMAL HIGH (ref 0–149)
VLDL Cholesterol Cal: 32 mg/dL (ref 5–40)

## 2022-11-12 LAB — PSA: Prostate Specific Ag, Serum: 3.2 ng/mL (ref 0.0–4.0)

## 2022-11-12 LAB — HIV ANTIBODY (ROUTINE TESTING W REFLEX): HIV Screen 4th Generation wRfx: NONREACTIVE

## 2022-11-12 LAB — HEPATITIS C ANTIBODY: Hep C Virus Ab: NONREACTIVE

## 2022-11-26 ENCOUNTER — Emergency Department (HOSPITAL_COMMUNITY)
Admission: EM | Admit: 2022-11-26 | Discharge: 2022-11-26 | Disposition: A | Discharge status: Home / Self Care | Payer: Managed Care, Other (non HMO) | Attending: Emergency Medicine | Admitting: Emergency Medicine

## 2022-11-26 ENCOUNTER — Emergency Department (HOSPITAL_COMMUNITY): Payer: Managed Care, Other (non HMO)

## 2022-11-26 ENCOUNTER — Other Ambulatory Visit: Payer: Self-pay

## 2022-11-26 ENCOUNTER — Encounter (HOSPITAL_COMMUNITY): Payer: Self-pay | Admitting: *Deleted

## 2022-11-26 DIAGNOSIS — M795 Residual foreign body in soft tissue: Secondary | ICD-10-CM

## 2022-11-26 DIAGNOSIS — W458XXA Other foreign body or object entering through skin, initial encounter: Secondary | ICD-10-CM | POA: Insufficient documentation

## 2022-11-26 DIAGNOSIS — S60453A Superficial foreign body of left middle finger, initial encounter: Secondary | ICD-10-CM | POA: Diagnosis present

## 2022-11-26 MED ORDER — CEFADROXIL 500 MG PO CAPS: 500.0000 mg | ORAL_CAPSULE | Freq: Two times a day (BID) | ORAL | 0 refills | Status: DC

## 2022-11-26 MED ORDER — LIDOCAINE HCL (PF) 1 % IJ SOLN
10.0000 mL | Freq: Once | INTRAMUSCULAR | Status: AC
  Administered 2022-11-26: 10 mL
  Filled 2022-11-26: qty 10

## 2022-11-26 NOTE — ED Provider Notes (Signed)
[facility] AT [facility] Provider Note   CSN: [id number] Arrival date & time: 11/26/22  1546     History  Chief Complaint  Patient presents with   Foreign Body    LAW CORSINO is a 52 y.o. male.   Foreign Body   52 year old male presents emergency department with complaints of fishing hook in finger.  Patient states he was fishing today when the lure stuck in his finger while attempting to remove it from the fishes mouth.  Hook in the left middle finger.  States his last tetanus was about a year ago.  Denies trauma elsewhere.  Past medical history significant for hypertension, allergic rhinitis, GERD, IBS, mitral prolapse  Home Medications Prior to Admission medications   Medication Sig Start Date End Date Taking? Authorizing Provider  cefadroxil (DURICEF) 500 MG capsule Take 1 capsule (500 mg total) by mouth 2 (two) times daily. 11/26/22  Yes Sherian Maroon A, PA  amLODipine (NORVASC) 10 MG tablet Take 1 tablet (10 mg total) by mouth daily. 10/19/22   Tommie Sams, DO  azelastine (ASTELIN) 0.1 % nasal spray Place 2 sprays into both nostrils 2 (two) times daily. Use in each nostril as directed 11/07/22   Birder Robson, MD  cholecalciferol (VITAMIN D) 1000 UNITS tablet Take 1,000 Units by mouth daily.    [provider]  ECHINACEA PO Take 1 tablet by mouth daily.    [provider]  fexofenadine (ALLEGRA) 180 MG tablet Take 1 tablet (180 mg total) by mouth daily as needed for allergies or rhinitis (Can take an extra dose during flare ups.). 11/07/22   Birder Robson, MD  Flaxseed, Linseed, (FLAX SEED OIL PO) Take 1 capsule by mouth daily.    [provider]  fluticasone (FLONASE) 50 MCG/ACT nasal spray Place 2 sprays into both nostrils daily. 11/07/22   Birder Robson, MD  Ginger, Zingiber officinalis, (GINGER PO) Take 1 capsule by mouth daily.    [provider]  Glucosamine HCl (GLUCOSAMINE PO) Take 2 tablets  by mouth daily.     [provider]  hydrochlorothiazide (HYDRODIURIL) 25 MG tablet Take 1 tablet (25 mg total) by mouth daily. 10/19/22   Everlene Other G, DO  ipratropium (ATROVENT) 0.06 % nasal spray Place 1 spray into both nostrils 4 (four) times daily as needed for rhinitis. 11/07/22   Birder Robson, MD  KRILL OIL PO Take 1 capsule by mouth daily.    [provider]  Multiple Vitamin (MULTIVITAMIN WITH MINERALS) TABS tablet Take 1 tablet by mouth daily.    [provider]  POTASSIUM PO Take 1 tablet by mouth daily.     [provider]  TURMERIC PO Take 1 tablet by mouth daily.    [provider]  vitamin C (ASCORBIC ACID) 500 MG tablet Take 500 mg by mouth daily.    [provider]      Allergies    Patient has no known allergies.    Review of Systems   Review of Systems  All other systems reviewed and are negative.   Physical Exam Updated Vital Signs BP (!) 136/91 (BP Location: Right Arm)   Pulse 76   Temp 98.7 F (37.1 C) (Oral)   Resp 16   Ht 6' 1.5" (1.867 m)   Wt 131.5 kg   SpO2 99%   BMI 37.74 kg/m  Physical Exam Vitals and nursing note reviewed.  Constitutional:  General: He is not in acute distress.    Appearance: He is well-developed.  HENT:     Head: Normocephalic and atraumatic.  Eyes:     Conjunctiva/sclera: Conjunctivae normal.  Cardiovascular:     Rate and Rhythm: Normal rate and regular rhythm.  Pulmonary:     Effort: Pulmonary effort is normal. No respiratory distress.     Breath sounds: Normal breath sounds.  Abdominal:     Palpations: Abdomen is soft.     Tenderness: There is no abdominal tenderness.  Musculoskeletal:     Cervical back: Neck supple.     Comments: Fishing hook present in distal left third digit on the lateral aspect.  Skin:    General: Skin is warm and dry.     Capillary Refill: Capillary refill takes less than 2 seconds.  Neurological:     Mental Status: He is alert.   Psychiatric:        Mood and Affect: Mood normal.     ED Results / Procedures / Treatments   Labs (all labs ordered are listed, but only abnormal results are displayed) Labs Reviewed - No data to display  EKG None  Radiology No results found.  Procedures .Foreign Body Removal  Date/Time: 11/26/2022 5:51 PM  Performed by: Peter Garter, PA Authorized by: Peter Garter, PA  Consent: Verbal consent obtained. Consent given by: patient Patient understanding: patient states understanding of the procedure being performed Patient consent: the patient's understanding of the procedure matches consent given Procedure consent: procedure consent matches procedure scheduled Patient identity confirmed: verbally with patient and arm band Time out: Immediately prior to procedure a "time out" was called to verify the correct patient, procedure, equipment, support staff and site/side marked as required. Body area: skin General location: upper extremity Location details: left long finger Anesthesia: digital block and nerve block  Anesthesia: Local Anesthetic: lidocaine 1% without epinephrine Anesthetic total: 7 mL  Sedation: Patient sedated: no  Patient restrained: no Patient cooperative: yes Localization method: visualized Removal mechanism: forceps Tendon involvement: none Depth: subcutaneous Complexity: simple 1 objects recovered. Objects recovered: Fishing hook Post-procedure assessment: foreign body removed Patient tolerance: patient tolerated the procedure well with no immediate complications      Medications Ordered in ED Medications  lidocaine (PF) (XYLOCAINE) 1 % injection 10 mL (10 mLs Infiltration Given 11/26/22 1737)    ED Course/ Medical Decision Making/ A&P                                 Medical Decision Making Risk Prescription drug management.   This patient presents to the ED for concern of foreign body in finger, this involves an extensive  number of treatment options, and is a complaint that carries with it a high risk of complications and morbidity.  The differential diagnosis includes foreign body in finger, fracture, dislocation, neurovascular compromise, ligamentous/tendinous damage   Co morbidities that complicate the patient evaluation  See HPI   Additional history obtained:  Additional history obtained from EMR External records from outside source obtained and reviewed including hospital records   Lab Tests:  N/a   Imaging Studies ordered:  na   Cardiac Monitoring: / EKG:  The patient was maintained on a cardiac monitor.  I personally viewed and interpreted the cardiac monitored which showed an underlying rhythm of: Sinus rhythm   Consultations Obtained:  N/a   Problem List / ED Course / Critical interventions /  Medication management  Foreign body in finger I ordered medication including lidocaine  Reevaluation of the patient after these medicines showed that the patient improved I have reviewed the patients home medicines and have made adjustments as needed   Social Determinants of Health:  Denies tobacco, licit drug use   Test / Admission - Considered:  Foreign body in finger Vitals signs within normal range and stable throughout visit. 52 year old male presents emergency department with complaints of fishing hook in finger.  On exam, patient with fishing hook superficially in left middle finger.  No obvious ligamentous/tendinous involvement.  Fishing hook was removed in manner as depicted above after patient was digitally blocked.  Will place patient on prophylactic antibiotics given dirty nature of fishing hook.  Will recommend continued local wound care and follow-up with primary care for reassessment.  Treatment plan discussed at length with patient and he acknowledged understanding was agreeable to said plan.  Patient overall well-appearing, afebrile in no acute distress. Worrisome  signs and symptoms were discussed with the patient, and the patient acknowledged understanding to return to the ED if noticed. Patient was stable upon discharge.          Final Clinical Impression(s) / ED Diagnoses Final diagnoses:  Foreign body (FB) in soft tissue    Rx / DC Orders ED Discharge Orders          Ordered    cefadroxil (DURICEF) 500 MG capsule  2 times daily        11/26/22 1804              Peter Garter, [place] 11/26/22 1810    Benney, [place], DO 11/29/22 1417

## 2022-11-26 NOTE — ED Triage Notes (Signed)
Pt with fishing hook to left middle finger, last tetanus shot was last year.

## 2022-11-26 NOTE — Discharge Instructions (Signed)
As discussed, fishing hook was removed successfully.  Will place on antibiotics given dirty fishing hook to prevent infection.  You may take Tylenol/Motrin for pain.  Please do not hesitate to return to emergency department for worrisome signs and symptoms we discussed become apparent.

## 2022-11-30 ENCOUNTER — Ambulatory Visit: Payer: Managed Care, Other (non HMO) | Admitting: Family Medicine

## 2022-11-30 ENCOUNTER — Encounter: Payer: Self-pay | Admitting: Family Medicine

## 2022-11-30 VITALS — BP 121/75 | HR 79 | Temp 98.4°F | Ht 73.5 in | Wt 286.0 lb

## 2022-11-30 DIAGNOSIS — Z0001 Encounter for general adult medical examination with abnormal findings: Secondary | ICD-10-CM

## 2022-11-30 DIAGNOSIS — A630 Anogenital (venereal) warts: Secondary | ICD-10-CM | POA: Diagnosis not present

## 2022-11-30 DIAGNOSIS — Z0000 Encounter for general adult medical examination without abnormal findings: Secondary | ICD-10-CM

## 2022-11-30 NOTE — Progress Notes (Signed)
Subjective:  Patient ID: Brady [name], male    DOB: 1970/03/19  Age: 52 y.o. MRN: [id number]  CC: Chief Complaint  Patient presents with   Annual Exam    HPI:  52 year old male with HTN, HLD, Obesity presents for an annual exam.  Hypertension is well-controlled.    Reviewed recent labs with the patient.  His triglycerides and LDL were mildly elevated.  He is going to work on dietary and lifestyle changes.  Patient recently got his flu shot.  Declines shingles vaccine.  The remainder of his preventative health care is up-to-date.  Patient reports that he still having difficulty with genital warts.  Needs referral.  Patient Active Problem List   Diagnosis Date Noted   Genital warts 10/11/2021   Mixed hyperlipidemia 04/02/2021   Obesity (BMI 30-39.9) 04/02/2021   Essential hypertension 04/12/2018   Annual physical exam 08/27/2014    Social Hx   Social History   Socioeconomic History   Marital status: Married    Spouse name: Not on file   Number of children: 1   Years of education: college   Highest education level: Not on file  Occupational History   Occupation: computer IT  Tobacco Use   Smoking status: Never   Smokeless tobacco: Never   Tobacco comments:    Never smoked  Vaping Use   Vaping status: Never Used  Substance and Sexual Activity   Alcohol use: Yes    Alcohol/week: 0.0 standard drinks of alcohol    Comment: one beer a month   Drug use: No   Sexual activity: Yes  Other Topics Concern   Not on file  Social History Narrative   Not on file   Social Determinants of Health   Financial Resource Strain: Not on file  Food Insecurity: Not on file  Transportation Needs: Not on file  Physical Activity: Not on file  Stress: Not on file  Social Connections: Not on file    Review of Systems Per HPI  Objective:  BP 121/75   Pulse 79   Temp 98.4 F (36.9 C)   Ht 6' 1.5" (1.867 m)   Wt 286 lb (129.7 kg)   SpO2 98%   BMI 37.22 kg/m       11/30/2022    8:41 AM 11/26/2022    6:13 PM 11/26/2022    4:42 PM  BP/Weight  Systolic BP 121 130 136  Diastolic BP 75 88 91  Wt. (Lbs) 286    BMI 37.22 kg/m2      Physical Exam Vitals and nursing note reviewed.  Constitutional:      General: He is not in acute distress.    Appearance: Normal appearance.  HENT:     Head: Normocephalic and atraumatic.  Eyes:     General:        Right eye: No discharge.        Left eye: No discharge.     Conjunctiva/sclera: Conjunctivae normal.  Cardiovascular:     Rate and Rhythm: Normal rate and regular rhythm.  Pulmonary:     Effort: Pulmonary effort is normal.     Breath sounds: Normal breath sounds. No wheezing, rhonchi or rales.  Neurological:     Mental Status: He is alert.  Psychiatric:        Mood and Affect: Mood normal.        Behavior: Behavior normal.     Lab Results  Component Value Date   WBC 7.6 11/11/2022  HGB 15.6 11/11/2022   HCT 48.5 11/11/2022   PLT 296 11/11/2022   GLUCOSE 94 11/11/2022   CHOL 197 11/11/2022   TRIG 179 (H) 11/11/2022   HDL 40 11/11/2022   LDLCALC 125 (H) 11/11/2022   ALT 21 11/11/2022   AST 17 11/11/2022   NA 140 11/11/2022   K 4.1 11/11/2022   CL 100 11/11/2022   CREATININE 1.05 11/11/2022   BUN 13 11/11/2022   CO2 24 11/11/2022   TSH 1.200 04/08/2020     Assessment & Plan:   Problem List Items Addressed This Visit       Musculoskeletal and Integument   Genital warts   Relevant Orders   Ambulatory referral to Dermatology     Other   Annual physical exam - Primary    Well.  Preventative health care updated.  Labs reviewed with the patient.  Advised dietary changes and regular exercise.       Follow-up:  Return in about 6 months (around 05/31/2023) for Follow up Chronic medical issues.  Everlene Other DO [facility]

## 2022-11-30 NOTE — Patient Instructions (Signed)
Watch diet and increase activity.  I will take care of the referral.  Follow up in 6 months.

## 2022-11-30 NOTE — Assessment & Plan Note (Addendum)
Well.  Preventative health care updated.  Labs reviewed with the patient.  Advised dietary changes and regular exercise.

## 2023-02-27 ENCOUNTER — Encounter: Payer: Self-pay | Admitting: Internal Medicine

## 2023-03-03 ENCOUNTER — Other Ambulatory Visit: Payer: Self-pay | Admitting: Internal Medicine

## 2023-03-03 DIAGNOSIS — J3089 Other allergic rhinitis: Secondary | ICD-10-CM

## 2023-03-03 MED ORDER — EPINEPHRINE 0.3 MG/0.3ML IJ SOAJ: 0.3000 mg | INTRAMUSCULAR | 1 refills | Status: DC | PRN

## 2023-03-03 NOTE — Progress Notes (Signed)
AIT Rx signed. Epipen sent.

## 2023-03-06 DIAGNOSIS — J3089 Other allergic rhinitis: Secondary | ICD-10-CM | POA: Diagnosis not present

## 2023-03-06 NOTE — Progress Notes (Signed)
 Aeroallergen Immunotherapy   Ordering Provider: Arleta Blanch   Patient Details  Name: Brady [name]  MRN: [id number]  Date of Birth: [birth year]   Order 2 of 2   Vial Label: mold   0.2 ml (Volume)  1:10 Concentration -- Aspergillus mix  0.2 ml (Volume)  1:10 Concentration -- Penicillium mix  0.2 ml (Volume)  1:20 Concentration -- Bipolaris sorokiniana  0.2 ml (Volume)  1:20 Concentration -- Drechslera spicifera  0.2 ml (Volume)  1:10 Concentration -- Mucor plumbeus  0.2 ml (Volume)  1:10 Concentration -- Fusarium moniliforme  0.2 ml (Volume)  1:40 Concentration -- Aureobasidium pullulans  0.2 ml (Volume)  1:10 Concentration -- Rhizopus oryzae    1.6  ml Extract Subtotal  3.4  ml Diluent   5.0  ml Maintenance Total   Schedule:  C  Silver Vial (1:1,000,000): Schedule C (5 doses)  Blue Vial (1:100,000): Schedule C (5 doses)  Yellow Vial (1:10,000): Schedule C (5 doses)  Green Vial (1:1,000): Schedule C (5 doses)  Red Vial (1:100): Schedule A (10 doses)   Special Instructions: none

## 2023-03-06 NOTE — Progress Notes (Signed)
 Aeroallergen Immunotherapy   Ordering Provider: Arleta Blanch   Patient Details  Name: Brady [name]  MRN: [id number]  Date of Birth: [birth year]   Order 1 of 2   Vial Label: dust mites   1.0 ml (Volume)   AU Concentration -- Mite Mix (DF 5,000 & DP 5,000)    1  ml Extract Subtotal  4  ml Diluent   5.0  ml Maintenance Total   Schedule:  C  Silver Vial (1:1,000,000): Schedule C (5 doses)  Blue Vial (1:100,000): Schedule C (5 doses)  Yellow Vial (1:10,000): Schedule C (5 doses)  Green Vial (1:1,000): Schedule C (5 doses)  Red Vial (1:100): Schedule A (10 doses)   Special Instructions: none

## 2023-03-06 NOTE — Progress Notes (Signed)
 EXP 03/05/24

## 2023-03-07 DIAGNOSIS — J302 Other seasonal allergic rhinitis: Secondary | ICD-10-CM | POA: Diagnosis not present

## 2023-03-24 ENCOUNTER — Telehealth: Payer: Self-pay | Admitting: Allergy & Immunology

## 2023-03-24 ENCOUNTER — Ambulatory Visit (INDEPENDENT_AMBULATORY_CARE_PROVIDER_SITE_OTHER): Payer: No Typology Code available for payment source

## 2023-03-24 DIAGNOSIS — J309 Allergic rhinitis, unspecified: Secondary | ICD-10-CM

## 2023-03-24 MED ORDER — EPINEPHRINE (ANAPHYLAXIS) 1 MG/ML IJ SOLN
1.0000 mg | Freq: Once | INTRAMUSCULAR | Status: AC
  Administered 2023-03-24: 1 mg via INTRAMUSCULAR

## 2023-03-24 MED ORDER — EPINEPHRINE 0.3 MG/0.3ML IJ SOAJ: 0.3000 mg | Freq: Once | INTRAMUSCULAR | Status: DC

## 2023-03-24 NOTE — Telephone Encounter (Signed)
Patient was in for his first allergy injection.  He reported dizziness and lightheadedness.  Vitals were normal.  This worsened over the course of 10 minutes.  We decided to just administer epinephrine and gave him a dose of Allegra.  He was monitored for 30 minutes after the epinephrine was administered and vitals were normal.  I recommended that he come back for shots but dilute his dose.  However, I then realized he was already on the silver vial.  We are going to administer 0.025 of the silver vial next week to see if he can tolerate that.  Malachi Bonds, MD [facility]

## 2023-03-24 NOTE — Addendum Note (Signed)
Addended by: Elsworth Soho on: 03/24/2023 04:42 PM   Modules accepted: Orders

## 2023-03-24 NOTE — Progress Notes (Signed)
Immunotherapy   Patient Details  Name: Brady [name] MRN: [id number] Date of Birth: 06-25-1970  03/24/2023  [name] started injections for DM and Mold. Patient received 0.05 of both his silver 1:1 million with an expiration of 03/05/2024. Patient was waiting his 30 minutes however after 8 minutes he complained of being light headed. His vitals 128/78 BP, 96% SP02, 80 HR, 17 RR. Patient was given 0.3 ml of Epi. Per Dr. Dellis Anes give 0.025 ml of Silver 1:1 million vials at next injection. Patient was stable at discharge. Following schedule: C Frequency:1 time per week Epi-Pen:Epi-Pen Available  Consent signed and patient instructions given.   Dub Mikes 03/24/2023, 1:40 PM

## 2023-03-24 NOTE — Telephone Encounter (Signed)
Flowsheet has been updated to reflect this change.

## 2023-03-27 NOTE — Telephone Encounter (Signed)
Late Entry..... Spoke with patient on Friday 03/24/2023. Patient was doing well and the light headedness subsided.

## 2023-03-31 ENCOUNTER — Ambulatory Visit (INDEPENDENT_AMBULATORY_CARE_PROVIDER_SITE_OTHER): Payer: No Typology Code available for payment source

## 2023-03-31 DIAGNOSIS — J309 Allergic rhinitis, unspecified: Secondary | ICD-10-CM

## 2023-04-07 ENCOUNTER — Ambulatory Visit (INDEPENDENT_AMBULATORY_CARE_PROVIDER_SITE_OTHER): Payer: No Typology Code available for payment source

## 2023-04-07 DIAGNOSIS — J309 Allergic rhinitis, unspecified: Secondary | ICD-10-CM

## 2023-04-21 ENCOUNTER — Ambulatory Visit (INDEPENDENT_AMBULATORY_CARE_PROVIDER_SITE_OTHER): Payer: No Typology Code available for payment source

## 2023-04-21 DIAGNOSIS — J309 Allergic rhinitis, unspecified: Secondary | ICD-10-CM | POA: Diagnosis not present

## 2023-04-29 ENCOUNTER — Other Ambulatory Visit: Payer: Self-pay | Admitting: Internal Medicine

## 2023-05-05 ENCOUNTER — Ambulatory Visit (INDEPENDENT_AMBULATORY_CARE_PROVIDER_SITE_OTHER): Payer: Self-pay

## 2023-05-05 DIAGNOSIS — J309 Allergic rhinitis, unspecified: Secondary | ICD-10-CM | POA: Diagnosis not present

## 2023-05-08 ENCOUNTER — Ambulatory Visit (INDEPENDENT_AMBULATORY_CARE_PROVIDER_SITE_OTHER): Payer: No Typology Code available for payment source | Admitting: Internal Medicine

## 2023-05-08 ENCOUNTER — Other Ambulatory Visit: Payer: Self-pay

## 2023-05-08 VITALS — BP 130/88 | HR 90 | Temp 98.4°F | Ht 73.5 in | Wt 290.6 lb

## 2023-05-08 DIAGNOSIS — Z8709 Personal history of other diseases of the respiratory system: Secondary | ICD-10-CM | POA: Diagnosis not present

## 2023-05-08 DIAGNOSIS — J3089 Other allergic rhinitis: Secondary | ICD-10-CM

## 2023-05-08 DIAGNOSIS — Z888 Allergy status to other drugs, medicaments and biological substances status: Secondary | ICD-10-CM | POA: Diagnosis not present

## 2023-05-08 DIAGNOSIS — T450X5A Adverse effect of antiallergic and antiemetic drugs, initial encounter: Secondary | ICD-10-CM

## 2023-05-08 MED ORDER — FEXOFENADINE HCL 180 MG PO TABS: 180.0000 mg | ORAL_TABLET | Freq: Every day | ORAL | 1 refills | Status: DC | PRN

## 2023-05-08 MED ORDER — IPRATROPIUM BROMIDE 0.06 % NA SOLN: 2.0000 | Freq: Three times a day (TID) | NASAL | 1 refills | Status: DC | PRN

## 2023-05-08 MED ORDER — AZELASTINE HCL 0.1 % NA SOLN: 2.0000 | Freq: Two times a day (BID) | NASAL | 1 refills | Status: DC

## 2023-05-08 MED ORDER — FLUTICASONE PROPIONATE 50 MCG/ACT NA SUSP: 2.0000 | Freq: Every day | NASAL | 1 refills | Status: DC

## 2023-05-08 NOTE — Progress Notes (Signed)
   FOLLOW UP Date of Service/Encounter:  05/08/23   Subjective:  Brady [name] (DOB: 1970-08-20) is a 53 y.o. male who [facility] for follow up for allergic rhinoconjunctivitis and hx of nasal polyps.   History obtained from: chart review and patient. At last visit with me on 11/07/2022, we discussed starting AIT due to uncontrolled symptoms. Initially with 0.05cc of silver vial, he felt lightheadedness.  Vitals were stable, normal physical exam, given Epi for possible reaction.  Restarted AIT on 0.025cc of silver vial.    Doing okay since last visit.  Has had trouble building up on the shots due to missed appointments- got sick once and then we were closed for an office meeting.  He plans to however be on schedule once a week going forward.  Has an Epipen.  No other shot reactions.  Using Flonase, Azelastine, Allegra daily and some days Ipratropium.  Does note lots of drainage whenever he eats especially with dairy.    Past Medical History: Past Medical History:  Diagnosis Date   Allergic rhinitis    Anxiety    Chronic pansinusitis    GERD (gastroesophageal reflux disease)    Hypertension    IBS (irritable bowel syndrome)    Mitral prolapse    Reflux     Objective:  BP 130/88  There is no height or weight on file to calculate BMI. Physical Exam: GEN: alert, well developed HEENT: clear conjunctiva, nose with mild inferior turbinate hypertrophy, pink nasal mucosa, clear rhinorrhea, + cobblestoning HEART: regular rate and rhythm, no murmur LUNGS: clear to auscultation bilaterally, no coughing, unlabored respiration SKIN: no rashes or lesions  Assessment:   1. Perennial allergic rhinitis   2. Hx of nasal polyp   3. 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  Use distilled water.   - Use Flonase 2 sprays each nostril daily. Aim upward and outward. - Use Azelastine 1-2 sprays each nostril twice daily as needed for runny nose, congestion, drainage, sneezing. Aim upward and outward. - Use Ipratroprium 1-2 sprays up to four times daily as needed for runny nose. Aim upward and outward. - Use Allegra 180mg  daily.  - Continue allergy shots weekly. Bring Epipen for each shot visit.  Initiated 03/2023.  Vial 1: dust mites, Vial 2: molds.  First shot with reaction of lightheadedness, treated with Epi.      Return in about 6 months (around 11/08/2023).  Alesia Morin, MD [facility]

## 2023-05-08 NOTE — Patient Instructions (Addendum)
 Allergic Rhinitis Allergic Conjunctivitis History of nasal polyp s/p surgery x2 - Positive skin test 11/2021: mold and dust mites - Use nasal saline rinses before nose sprays such as with Neilmed Sinus Rinse.  Use distilled water.   - Use Flonase 2 sprays each nostril daily. Aim upward and outward. - Use Azelastine 2 sprays each nostril twice daily as needed for runny nose, congestion, drainage, sneezing. Aim upward and outward. - Use Ipratroprium 1-2 sprays up to four times daily as needed for runny nose. Aim upward and outward. - Use Allegra 180mg  daily.  - Continue allergy shots weekly. Bring Epipen for each shot visit.

## 2023-05-12 ENCOUNTER — Ambulatory Visit (INDEPENDENT_AMBULATORY_CARE_PROVIDER_SITE_OTHER): Payer: Self-pay

## 2023-05-12 DIAGNOSIS — J309 Allergic rhinitis, unspecified: Secondary | ICD-10-CM

## 2023-05-18 ENCOUNTER — Encounter: Payer: Self-pay | Admitting: Internal Medicine

## 2023-05-19 ENCOUNTER — Ambulatory Visit (INDEPENDENT_AMBULATORY_CARE_PROVIDER_SITE_OTHER): Payer: Self-pay

## 2023-05-19 DIAGNOSIS — J309 Allergic rhinitis, unspecified: Secondary | ICD-10-CM | POA: Diagnosis not present

## 2023-05-26 ENCOUNTER — Ambulatory Visit (INDEPENDENT_AMBULATORY_CARE_PROVIDER_SITE_OTHER): Payer: Self-pay

## 2023-05-26 DIAGNOSIS — J309 Allergic rhinitis, unspecified: Secondary | ICD-10-CM

## 2023-05-31 ENCOUNTER — Ambulatory Visit: Payer: Managed Care, Other (non HMO) | Admitting: Family Medicine

## 2023-05-31 ENCOUNTER — Encounter: Payer: Self-pay | Admitting: Family Medicine

## 2023-05-31 VITALS — BP 129/89 | HR 69 | Temp 97.9°F | Ht 73.5 in | Wt 288.0 lb

## 2023-05-31 DIAGNOSIS — I10 Essential (primary) hypertension: Secondary | ICD-10-CM | POA: Diagnosis not present

## 2023-05-31 DIAGNOSIS — E782 Mixed hyperlipidemia: Secondary | ICD-10-CM

## 2023-05-31 MED ORDER — HYDROCHLOROTHIAZIDE 25 MG PO TABS: 25.0000 mg | ORAL_TABLET | Freq: Every day | ORAL | 3 refills | Status: AC

## 2023-05-31 MED ORDER — AMLODIPINE BESYLATE 10 MG PO TABS: 10.0000 mg | ORAL_TABLET | Freq: Every day | ORAL | 3 refills | Status: DC

## 2023-05-31 NOTE — Patient Instructions (Signed)
You're doing well    Follow-up in 6 months

## 2023-05-31 NOTE — Assessment & Plan Note (Signed)
 Patient not at high risk at this time.  Will continue to monitor.  Continue lifestyle changes. The 10-year ASCVD risk score (Arnett DK, et al., 2019) is: 7.3%   Values used to calculate the score:     Age: 53 years     Sex: Male     Is Non-Hispanic African American: No     Diabetic: No     Tobacco smoker: No     Systolic Blood Pressure: 129 mmHg     Is BP treated: Yes     HDL Cholesterol: 40 mg/dL     Total Cholesterol: 197 mg/dL

## 2023-05-31 NOTE — Progress Notes (Signed)
 Subjective:  Patient ID: Brady [name], male    DOB: 1970/07/11  Age: 53 y.o. MRN: [id number]  CC:   Chief Complaint  Patient presents with   Follow-up    6 month f/u chronic medical conditions    HPI:  53 year old male with hypertension, obesity, hyperlipidemia presents for follow-up.  Patient reports that he is doing well.  No chest pain or shortness of breath.  BP stable on HCTZ and amlodipine.  He does report that he has some lower extremity edema.  He works a sedentary job and sits most of the day.  Hyperlipidemia not currently being treated with the medication.  Advised lifestyle changes previously.   Patient Active Problem List   Diagnosis Date Noted   Genital warts 10/11/2021   Mixed hyperlipidemia 04/02/2021   Obesity (BMI 30-39.9) 04/02/2021   Essential hypertension 04/12/2018    Social Hx   Social History   Socioeconomic History   Marital status: Married    Spouse name: Not on file   Number of children: 1   Years of education: college   Highest education level: Not on file  Occupational History   Occupation: computer IT  Tobacco Use   Smoking status: Never   Smokeless tobacco: Never   Tobacco comments:    Never smoked  Vaping Use   Vaping status: Never Used  Substance and Sexual Activity   Alcohol use: Yes    Alcohol/week: 0.0 standard drinks of alcohol    Comment: one beer a month   Drug use: No   Sexual activity: Yes  Other Topics Concern   Not on file  Social History Narrative   Not on file   Social Drivers of Health   Financial Resource Strain: Not on file  Food Insecurity: Not on file  Transportation Needs: Not on file  Physical Activity: Not on file  Stress: Not on file  Social Connections: Not on file    Review of Systems Per HPI  Objective:  BP 129/89   Pulse 69   Temp 97.9 F (36.6 C)   Ht 6' 1.5" (1.867 m)   Wt 288 lb (130.6 kg)   SpO2 99%   BMI 37.48 kg/m      05/31/2023    8:42 AM 05/08/2023    8:38 AM  11/30/2022    8:41 AM  BP/Weight  Systolic BP 129 130 121  Diastolic BP 89 88 75  Wt. (Lbs) 288 290.6 286  BMI 37.48 kg/m2 37.82 kg/m2 37.22 kg/m2    Physical Exam Vitals and nursing note reviewed.  Constitutional:      General: He is not in acute distress.    Appearance: Normal appearance.  HENT:     Head: Normocephalic and atraumatic.  Eyes:     General:        Right eye: No discharge.        Left eye: No discharge.     Conjunctiva/sclera: Conjunctivae normal.  Cardiovascular:     Rate and Rhythm: Normal rate and regular rhythm.  Pulmonary:     Effort: Pulmonary effort is normal.     Breath sounds: Normal breath sounds. No wheezing, rhonchi or rales.  Neurological:     Mental Status: He is alert.  Psychiatric:        Mood and Affect: Mood normal.        Behavior: Behavior normal.     Lab Results  Component Value Date   WBC 7.6 11/11/2022   HGB 15.6 11/11/2022  HCT 48.5 11/11/2022   PLT 296 11/11/2022   GLUCOSE 94 11/11/2022   CHOL 197 11/11/2022   TRIG 179 (H) 11/11/2022   HDL 40 11/11/2022   LDLCALC 125 (H) 11/11/2022   ALT 21 11/11/2022   AST 17 11/11/2022   NA 140 11/11/2022   K 4.1 11/11/2022   CL 100 11/11/2022   CREATININE 1.05 11/11/2022   BUN 13 11/11/2022   CO2 24 11/11/2022   TSH 1.200 04/08/2020     Assessment & Plan:  Mixed hyperlipidemia Assessment & Plan: Patient not at high risk at this time.  Will continue to monitor.  Continue lifestyle changes. The 10-year ASCVD risk score (Arnett DK, et al., 2019) is: 7.3%   Values used to calculate the score:     Age: 40 years     Sex: Male     Is Non-Hispanic African American: No     Diabetic: No     Tobacco smoker: No     Systolic Blood Pressure: 129 mmHg     Is BP treated: Yes     HDL Cholesterol: 40 mg/dL     Total Cholesterol: 197 mg/dL    Essential hypertension Assessment & Plan: BP stable.  Continue current medications.  Medications refilled today.  Orders: -     amLODIPine  Besylate; Take 1 tablet (10 mg total) by mouth daily.  Dispense: 90 tablet; Refill: 3 -     hydroCHLOROthiazide; Take 1 tablet (25 mg total) by mouth daily.  Dispense: 90 tablet; Refill: 3    Follow-up: 6 Months  Nazanin Kinner Adriana Simas DO [facility]

## 2023-05-31 NOTE — Assessment & Plan Note (Signed)
BP stable.  Continue current medications.  Medications refilled today.

## 2023-05-31 NOTE — Addendum Note (Signed)
 Addended by: Tommie Sams on: 05/31/2023 10:04 AM   Modules accepted: Level of Service

## 2023-06-02 ENCOUNTER — Ambulatory Visit (INDEPENDENT_AMBULATORY_CARE_PROVIDER_SITE_OTHER)

## 2023-06-02 DIAGNOSIS — J309 Allergic rhinitis, unspecified: Secondary | ICD-10-CM | POA: Diagnosis not present

## 2023-06-09 ENCOUNTER — Ambulatory Visit (INDEPENDENT_AMBULATORY_CARE_PROVIDER_SITE_OTHER)

## 2023-06-09 DIAGNOSIS — J309 Allergic rhinitis, unspecified: Secondary | ICD-10-CM | POA: Diagnosis not present

## 2023-06-23 ENCOUNTER — Ambulatory Visit (INDEPENDENT_AMBULATORY_CARE_PROVIDER_SITE_OTHER): Payer: Self-pay

## 2023-06-23 DIAGNOSIS — J309 Allergic rhinitis, unspecified: Secondary | ICD-10-CM

## 2023-06-30 ENCOUNTER — Ambulatory Visit (INDEPENDENT_AMBULATORY_CARE_PROVIDER_SITE_OTHER)

## 2023-06-30 DIAGNOSIS — J309 Allergic rhinitis, unspecified: Secondary | ICD-10-CM

## 2023-07-07 ENCOUNTER — Ambulatory Visit (INDEPENDENT_AMBULATORY_CARE_PROVIDER_SITE_OTHER)

## 2023-07-07 DIAGNOSIS — J309 Allergic rhinitis, unspecified: Secondary | ICD-10-CM | POA: Diagnosis not present

## 2023-07-12 ENCOUNTER — Ambulatory Visit (INDEPENDENT_AMBULATORY_CARE_PROVIDER_SITE_OTHER)

## 2023-07-12 DIAGNOSIS — J309 Allergic rhinitis, unspecified: Secondary | ICD-10-CM

## 2023-07-18 ENCOUNTER — Other Ambulatory Visit: Payer: Self-pay | Admitting: Internal Medicine

## 2023-07-21 ENCOUNTER — Ambulatory Visit (INDEPENDENT_AMBULATORY_CARE_PROVIDER_SITE_OTHER)

## 2023-07-21 DIAGNOSIS — J309 Allergic rhinitis, unspecified: Secondary | ICD-10-CM | POA: Diagnosis not present

## 2023-07-26 ENCOUNTER — Ambulatory Visit: Admitting: Dermatology

## 2023-07-29 ENCOUNTER — Telehealth: Admitting: Physician Assistant

## 2023-07-29 DIAGNOSIS — J019 Acute sinusitis, unspecified: Secondary | ICD-10-CM | POA: Diagnosis not present

## 2023-07-29 DIAGNOSIS — B9689 Other specified bacterial agents as the cause of diseases classified elsewhere: Secondary | ICD-10-CM

## 2023-07-29 MED ORDER — AMOXICILLIN-POT CLAVULANATE 875-125 MG PO TABS: 1.0000 | ORAL_TABLET | Freq: Two times a day (BID) | ORAL | 0 refills | Status: DC

## 2023-07-29 NOTE — Progress Notes (Signed)

## 2023-08-04 ENCOUNTER — Ambulatory Visit (INDEPENDENT_AMBULATORY_CARE_PROVIDER_SITE_OTHER): Payer: Self-pay

## 2023-08-04 DIAGNOSIS — J309 Allergic rhinitis, unspecified: Secondary | ICD-10-CM | POA: Diagnosis not present

## 2023-08-11 ENCOUNTER — Ambulatory Visit (INDEPENDENT_AMBULATORY_CARE_PROVIDER_SITE_OTHER)

## 2023-08-11 DIAGNOSIS — J309 Allergic rhinitis, unspecified: Secondary | ICD-10-CM | POA: Diagnosis not present

## 2023-08-18 ENCOUNTER — Ambulatory Visit (INDEPENDENT_AMBULATORY_CARE_PROVIDER_SITE_OTHER): Payer: Self-pay

## 2023-08-18 DIAGNOSIS — J309 Allergic rhinitis, unspecified: Secondary | ICD-10-CM | POA: Diagnosis not present

## 2023-08-25 ENCOUNTER — Ambulatory Visit (INDEPENDENT_AMBULATORY_CARE_PROVIDER_SITE_OTHER)

## 2023-08-25 DIAGNOSIS — J309 Allergic rhinitis, unspecified: Secondary | ICD-10-CM | POA: Diagnosis not present

## 2023-08-30 ENCOUNTER — Ambulatory Visit (INDEPENDENT_AMBULATORY_CARE_PROVIDER_SITE_OTHER)

## 2023-08-30 DIAGNOSIS — J309 Allergic rhinitis, unspecified: Secondary | ICD-10-CM | POA: Diagnosis not present

## 2023-09-08 ENCOUNTER — Ambulatory Visit (INDEPENDENT_AMBULATORY_CARE_PROVIDER_SITE_OTHER)

## 2023-09-08 DIAGNOSIS — J309 Allergic rhinitis, unspecified: Secondary | ICD-10-CM

## 2023-09-15 ENCOUNTER — Ambulatory Visit (INDEPENDENT_AMBULATORY_CARE_PROVIDER_SITE_OTHER): Payer: Self-pay

## 2023-09-15 DIAGNOSIS — J309 Allergic rhinitis, unspecified: Secondary | ICD-10-CM | POA: Diagnosis not present

## 2023-09-18 ENCOUNTER — Ambulatory Visit: Admitting: Physician Assistant

## 2023-09-18 ENCOUNTER — Encounter: Payer: Self-pay | Admitting: Physician Assistant

## 2023-09-18 VITALS — BP 130/82

## 2023-09-18 DIAGNOSIS — L821 Other seborrheic keratosis: Secondary | ICD-10-CM

## 2023-09-18 DIAGNOSIS — A630 Anogenital (venereal) warts: Secondary | ICD-10-CM

## 2023-09-18 NOTE — Progress Notes (Signed)
   New Patient Visit   Subjective  Brady [name] is a 53 y.o. male who presents for the following: Wart of scrotum x at least a year. He did have another one but it went away. Has had warts on his penis in years past but has used Condylox  with success. Duration of condyloma is ~ 10 years. He also has a spot on his elbow that has been there 1-2 years.   The following portions of the chart were reviewed this encounter and updated as appropriate: medications, allergies, medical history  Review of Systems:  No other skin or systemic complaints except as noted in HPI or Assessment and Plan.  Objective  Well appearing patient in no apparent distress; mood and affect are within normal limits.   A focused examination was performed of the following areas:   Relevant exam findings are noted in the Assessment and Plan.  Scrotum Verrucous papule  Assessment & Plan   SEBORRHEIC KERATOSIS - Stuck-on, waxy, tan-brown papule of right elbow - Benign-appearing - Discussed benign etiology and prognosis. - Observe - Call for any changes  CONDYLOMA Scrotum Destruction of lesion - Scrotum Complexity: simple   Destruction method: cryotherapy   Informed consent: discussed and consent obtained   Timeout:  patient name, date of birth, surgical site, and procedure verified Lesion destroyed using liquid nitrogen: Yes   Region frozen until ice ball extended beyond lesion: Yes   Outcome: patient tolerated procedure well with no complications   Post-procedure details: wound care instructions given    SEBORRHEIC KERATOSIS    Return in about 6 weeks (around 10/30/2023) for wart follow up.  I, Roseline Hutchinson, CMA, am acting as scribe for Achille Xiang K, PA-C .   Documentation: I have reviewed the above documentation for accuracy and completeness, and I agree with the above.  Knox Holdman K, PA-C

## 2023-09-18 NOTE — Patient Instructions (Signed)

## 2023-09-22 ENCOUNTER — Ambulatory Visit (INDEPENDENT_AMBULATORY_CARE_PROVIDER_SITE_OTHER): Payer: Self-pay

## 2023-09-22 DIAGNOSIS — J309 Allergic rhinitis, unspecified: Secondary | ICD-10-CM

## 2023-09-29 ENCOUNTER — Ambulatory Visit (INDEPENDENT_AMBULATORY_CARE_PROVIDER_SITE_OTHER): Payer: Self-pay

## 2023-09-29 DIAGNOSIS — J309 Allergic rhinitis, unspecified: Secondary | ICD-10-CM

## 2023-10-04 ENCOUNTER — Other Ambulatory Visit: Payer: Self-pay | Admitting: Internal Medicine

## 2023-10-06 ENCOUNTER — Ambulatory Visit (INDEPENDENT_AMBULATORY_CARE_PROVIDER_SITE_OTHER): Payer: Self-pay

## 2023-10-06 DIAGNOSIS — J309 Allergic rhinitis, unspecified: Secondary | ICD-10-CM

## 2023-10-09 ENCOUNTER — Other Ambulatory Visit: Payer: Self-pay | Admitting: Internal Medicine

## 2023-10-13 ENCOUNTER — Ambulatory Visit (INDEPENDENT_AMBULATORY_CARE_PROVIDER_SITE_OTHER)

## 2023-10-13 DIAGNOSIS — J309 Allergic rhinitis, unspecified: Secondary | ICD-10-CM

## 2023-10-20 ENCOUNTER — Ambulatory Visit (INDEPENDENT_AMBULATORY_CARE_PROVIDER_SITE_OTHER)

## 2023-10-20 DIAGNOSIS — J309 Allergic rhinitis, unspecified: Secondary | ICD-10-CM | POA: Diagnosis not present

## 2023-10-27 ENCOUNTER — Ambulatory Visit (INDEPENDENT_AMBULATORY_CARE_PROVIDER_SITE_OTHER)

## 2023-10-27 DIAGNOSIS — J309 Allergic rhinitis, unspecified: Secondary | ICD-10-CM | POA: Diagnosis not present

## 2023-11-03 ENCOUNTER — Ambulatory Visit (INDEPENDENT_AMBULATORY_CARE_PROVIDER_SITE_OTHER)

## 2023-11-03 DIAGNOSIS — J309 Allergic rhinitis, unspecified: Secondary | ICD-10-CM

## 2023-11-07 ENCOUNTER — Ambulatory Visit: Admitting: Physician Assistant

## 2023-11-10 ENCOUNTER — Ambulatory Visit (INDEPENDENT_AMBULATORY_CARE_PROVIDER_SITE_OTHER)

## 2023-11-10 DIAGNOSIS — J309 Allergic rhinitis, unspecified: Secondary | ICD-10-CM

## 2023-11-13 ENCOUNTER — Ambulatory Visit (INDEPENDENT_AMBULATORY_CARE_PROVIDER_SITE_OTHER): Admitting: Internal Medicine

## 2023-11-13 ENCOUNTER — Encounter: Payer: Self-pay | Admitting: Internal Medicine

## 2023-11-13 VITALS — BP 122/76 | HR 96 | Temp 98.6°F | Wt 287.5 lb

## 2023-11-13 DIAGNOSIS — J3089 Other allergic rhinitis: Secondary | ICD-10-CM | POA: Diagnosis not present

## 2023-11-13 DIAGNOSIS — J339 Nasal polyp, unspecified: Secondary | ICD-10-CM

## 2023-11-13 MED ORDER — FLUTICASONE PROPIONATE 50 MCG/ACT NA SUSP: 2.0000 | Freq: Every day | NASAL | 1 refills | Status: AC

## 2023-11-13 MED ORDER — AZELASTINE HCL 137 MCG/SPRAY NA SOLN: 2.0000 | Freq: Two times a day (BID) | NASAL | 1 refills | Status: AC | PRN

## 2023-11-13 MED ORDER — FEXOFENADINE HCL 180 MG PO TABS: 180.0000 mg | ORAL_TABLET | Freq: Every day | ORAL | 1 refills | Status: AC | PRN

## 2023-11-13 MED ORDER — IPRATROPIUM BROMIDE 0.06 % NA SOLN: 1.0000 | Freq: Three times a day (TID) | NASAL | 2 refills | Status: AC | PRN

## 2023-11-13 MED ORDER — EPINEPHRINE 0.3 MG/0.3ML IJ SOAJ: 0.3000 mg | INTRAMUSCULAR | 1 refills | Status: AC | PRN

## 2023-11-13 NOTE — Patient Instructions (Addendum)
 Allergic Rhinitis Allergic Conjunctivitis History of nasal polyp s/p surgery x2 - Positive skin test 11/2021: mold and dust mites - Use nasal saline rinses before nose sprays such as with Neilmed Sinus Rinse.  Use distilled water .   - Use Flonase  2 sprays each nostril daily. Aim upward and outward. - Use Azelastine  2 sprays each nostril twice daily as needed for runny nose, congestion, drainage, sneezing. Aim upward and outward. - Use Ipratroprium 1-2 sprays up to four times daily as needed for runny nose. Aim upward and outward. - Use Allegra  180mg  daily.  - Continue allergy  shots weekly. Bring Epipen  for each shot visit. Initiated 03/2023. Red vial reach 10/2023.

## 2023-11-13 NOTE — Progress Notes (Signed)
   FOLLOW UP Date of Service/Encounter:  11/13/23   Subjective:  Brady [name] (DOB: 1970/08/09) is a 53 y.o. male who [facility] for follow up for allergic rhinoconjunctivitis and nasal polyps.   History obtained from: chart review and patient. Last seen 05/08/2023 and doing well on AIT + allergy  medications.   Reports less allergy  flare ups with being on shots.  Does note some headaches when he gets the shot.  Recently has also been very congested with drainage.  Using Flonase /Azelastine /Allegra  daily and Atrovent  spray PRN.  Has an Epipen , no other shot reactions.    Past Medical History: Past Medical History:  Diagnosis Date   Allergic rhinitis    Anxiety    Chronic pansinusitis    GERD (gastroesophageal reflux disease)    Hypertension    IBS (irritable bowel syndrome)    Mitral prolapse    Reflux     Objective:  BP 122/76   Pulse 96   Temp 98.6 F (37 C)   Wt 287 lb 8 oz (130.4 kg)   SpO2 96%   BMI 37.42 kg/m  Body mass index is 37.42 kg/m. Physical Exam: GEN: alert, well developed HEENT: clear conjunctiva, nose with mild inferior turbinate hypertrophy, pink nasal mucosa, + clear rhinorrhea, + cobblestoning HEART: regular rate and rhythm, no murmur LUNGS: clear to auscultation bilaterally, no coughing, unlabored respiration SKIN: no rashes or lesions  Assessment:   1. Perennial allergic rhinitis   2. Nasal polyposis     Plan/Recommendations:  Allergic Rhinitis Allergic Conjunctivitis History of nasal polyp s/p surgery x2 - Improving but remains uncontrolled.  Continue AIT.   - Positive skin test 11/2021: mold and dust mites - Use nasal saline rinses before nose sprays such as with Neilmed Sinus Rinse.  Use distilled water .   - Use Flonase  2 sprays each nostril daily. Aim upward and outward. - Use Azelastine  2 sprays each nostril twice daily as needed for runny nose, congestion, drainage, sneezing. Aim  upward and outward. - Use Ipratroprium 1-2 sprays up to four times daily as needed for runny nose. Aim upward and outward. - Use Allegra  180mg  daily.  - Continue allergy  shots weekly. Bring Epipen  for each shot visit. Initiated 03/2023. Red vial reach 10/2023.      Return in about 6 months (around 05/12/2024).  Arleta Blanch, MD [facility]

## 2023-11-24 ENCOUNTER — Ambulatory Visit (INDEPENDENT_AMBULATORY_CARE_PROVIDER_SITE_OTHER): Payer: Self-pay

## 2023-11-24 DIAGNOSIS — J309 Allergic rhinitis, unspecified: Secondary | ICD-10-CM

## 2023-11-30 ENCOUNTER — Encounter: Payer: Self-pay | Admitting: Family Medicine

## 2023-11-30 ENCOUNTER — Ambulatory Visit (INDEPENDENT_AMBULATORY_CARE_PROVIDER_SITE_OTHER): Admitting: Family Medicine

## 2023-11-30 VITALS — BP 106/70 | HR 80 | Ht 74.0 in | Wt 285.0 lb

## 2023-11-30 DIAGNOSIS — Z130 Encounter for screening for diseases of the blood and blood-forming organs and certain disorders involving the immune mechanism: Secondary | ICD-10-CM

## 2023-11-30 DIAGNOSIS — Z23 Encounter for immunization: Secondary | ICD-10-CM

## 2023-11-30 DIAGNOSIS — Z125 Encounter for screening for malignant neoplasm of prostate: Secondary | ICD-10-CM

## 2023-11-30 DIAGNOSIS — I10 Essential (primary) hypertension: Secondary | ICD-10-CM | POA: Diagnosis not present

## 2023-11-30 DIAGNOSIS — E782 Mixed hyperlipidemia: Secondary | ICD-10-CM | POA: Diagnosis not present

## 2023-11-30 MED ORDER — AMLODIPINE BESYLATE 5 MG PO TABS: 5.0000 mg | ORAL_TABLET | Freq: Every day | ORAL | 3 refills | Status: AC

## 2023-11-30 NOTE — Assessment & Plan Note (Signed)
 Well controlled. Decreasing Norvasc .

## 2023-11-30 NOTE — Patient Instructions (Signed)
 Labs today.  Follow up in 6 months  Norvasc  decreased.

## 2023-11-30 NOTE — Progress Notes (Signed)
 Subjective:  Patient ID: Brady [name], male    DOB: 03/10/1970  Age: 53 y.o. MRN: [id number]  CC:   Chief Complaint  Patient presents with   Hyperlipidemia    Six month follow up     HPI:  53 year old male presents for follow-up.  Hypertension very well-controlled.  Perhaps too well-controlled.  He does note some dizziness.  He is currently on Norvasc  10 mg daily and HCTZ 25 mg daily.  Will discuss today.  He is feeling well.  No chest pain or shortness of breath.  Needs his routine labs.  Declines immunizations today.  Patient Active Problem List   Diagnosis Date Noted   Chronic sinusitis 03/07/2022   Genital warts 10/11/2021   Mixed hyperlipidemia 04/02/2021   Obesity (BMI 30-39.9) 04/02/2021   Essential hypertension 04/12/2018    Social Hx   Social History   Socioeconomic History   Marital status: Married    Spouse name: Not on file   Number of children: 1   Years of education: college   Highest education level: Bachelor's degree (e.g., BA, AB, BS)  Occupational History   Occupation: computer IT  Tobacco Use   Smoking status: Never   Smokeless tobacco: Never   Tobacco comments:    Never smoked  Vaping Use   Vaping status: Never Used  Substance and Sexual Activity   Alcohol use: Yes    Alcohol/week: 0.0 standard drinks of alcohol    Comment: one beer a month   Drug use: No   Sexual activity: Yes  Other Topics Concern   Not on file  Social History Narrative   Not on file   Social Drivers of Health   Financial Resource Strain: Low Risk  (11/27/2023)   Overall Financial Resource Strain (CARDIA)    Difficulty of Paying Living Expenses: Not very hard  Food Insecurity: No Food Insecurity (11/27/2023)   Hunger Vital Sign    Worried About Running Out of Food in the Last Year: Never true    Ran Out of Food in the Last Year: Never true  Transportation Needs: No Transportation Needs (11/27/2023)   PRAPARE - Administrator, Civil Service  (Medical): No    Lack of Transportation (Non-Medical): No  Physical Activity: Insufficiently Active (11/27/2023)   Exercise Vital Sign    Days of Exercise per Week: 1 day    Minutes of Exercise per Session: 20 min  Stress: No Stress Concern Present (11/27/2023)   [employer] of Occupational Health - Occupational Stress Questionnaire    Feeling of Stress: Only a little  Social Connections: Moderately Integrated (11/27/2023)   Social Connection and Isolation Panel    Frequency of Communication with Friends and Family: Twice a week    Frequency of Social Gatherings with Friends and Family: More than three times a week    Attends Religious Services: Never    Database administrator or Organizations: Yes    Attends Banker Meetings: 1 to 4 times per year    Marital Status: Married    Review of Systems Per HPI  Objective:  BP 106/70   Pulse 80   Ht 6' 2 (1.88 m)   Wt 285 lb (129.3 kg)   SpO2 98%   BMI 36.59 kg/m      11/30/2023    8:33 AM 11/13/2023    8:43 AM 09/18/2023    1:39 PM  BP/Weight  Systolic BP 106 122 130  Diastolic BP 70 76  82  Wt. (Lbs) 285 287.5   BMI 36.59 kg/m2 37.42 kg/m2     Physical Exam Vitals and nursing note reviewed.  Constitutional:      General: He is not in acute distress.    Appearance: Normal appearance.  HENT:     Head: Normocephalic and atraumatic.  Cardiovascular:     Rate and Rhythm: Normal rate and regular rhythm.  Pulmonary:     Effort: Pulmonary effort is normal.     Breath sounds: Normal breath sounds. No wheezing, rhonchi or rales.  Neurological:     Mental Status: He is alert.  Psychiatric:        Mood and Affect: Mood normal.        Behavior: Behavior normal.     Lab Results  Component Value Date   WBC 7.6 11/11/2022   HGB 15.6 11/11/2022   HCT 48.5 11/11/2022   PLT 296 11/11/2022   GLUCOSE 94 11/11/2022   CHOL 197 11/11/2022   TRIG 179 (H) 11/11/2022   HDL 40 11/11/2022   LDLCALC 125 (H)  11/11/2022   ALT 21 11/11/2022   AST 17 11/11/2022   NA 140 11/11/2022   K 4.1 11/11/2022   CL 100 11/11/2022   CREATININE 1.05 11/11/2022   BUN 13 11/11/2022   CO2 24 11/11/2022   TSH 1.200 04/08/2020     Assessment & Plan:  Essential hypertension Assessment & Plan: Well controlled. Decreasing Norvasc .  Orders: -     amLODIPine  Besylate; Take 1 tablet (5 mg total) by mouth daily.  Dispense: 90 tablet; Refill: 3 -     CMP14+EGFR  Need for hepatitis vaccination -     Lipid panel  Mixed hyperlipidemia  Screening for deficiency anemia -     CBC  Screening PSA (prostate specific antigen) -     PSA    Follow-up:  6 months  Caitlain Tweed Bluford DO [facility]

## 2023-12-01 ENCOUNTER — Ambulatory Visit (INDEPENDENT_AMBULATORY_CARE_PROVIDER_SITE_OTHER)

## 2023-12-01 DIAGNOSIS — J309 Allergic rhinitis, unspecified: Secondary | ICD-10-CM

## 2023-12-01 LAB — CMP14+EGFR
ALT: 20 IU/L (ref 0–44)
AST: 20 IU/L (ref 0–40)
Albumin: 4.3 g/dL (ref 3.8–4.9)
Alkaline Phosphatase: 84 IU/L (ref 47–123)
BUN/Creatinine Ratio: 12 (ref 9–20)
BUN: 13 mg/dL (ref 6–24)
Bilirubin Total: 0.5 mg/dL (ref 0.0–1.2)
CO2: 24 mmol/L (ref 20–29)
Calcium: 9.9 mg/dL (ref 8.7–10.2)
Chloride: 102 mmol/L (ref 96–106)
Creatinine, Ser: 1.09 mg/dL (ref 0.76–1.27)
Globulin, Total: 2.4 g/dL (ref 1.5–4.5)
Glucose: 101 mg/dL — ABNORMAL HIGH (ref 70–99)
Potassium: 4 mmol/L (ref 3.5–5.2)
Sodium: 140 mmol/L (ref 134–144)
Total Protein: 6.7 g/dL (ref 6.0–8.5)
eGFR: 80 mL/min/1.73 (ref >59)

## 2023-12-01 LAB — CBC
Hematocrit: 48.6 % (ref 37.5–51.0)
Hemoglobin: 15.9 g/dL (ref 13.0–17.7)
MCH: 28.3 pg (ref 26.6–33.0)
MCHC: 32.7 g/dL (ref 31.5–35.7)
MCV: 87 fL (ref 79–97)
Platelets: 261 x10E3/uL (ref 150–450)
RBC: 5.61 x10E6/uL (ref 4.14–5.80)
RDW: 13.7 % (ref 11.6–15.4)
WBC: 7 x10E3/uL (ref 3.4–10.8)

## 2023-12-01 LAB — LIPID PANEL
Chol/HDL Ratio: 5 ratio (ref 0.0–5.0)
Cholesterol, Total: 185 mg/dL (ref 100–199)
HDL: 37 mg/dL — ABNORMAL LOW (ref >39)
LDL Chol Calc (NIH): 105 mg/dL — ABNORMAL HIGH (ref 0–99)
Triglycerides: 249 mg/dL — ABNORMAL HIGH (ref 0–149)
VLDL Cholesterol Cal: 43 mg/dL — ABNORMAL HIGH (ref 5–40)

## 2023-12-01 LAB — PSA: Prostate Specific Ag, Serum: 2.7 ng/mL (ref 0.0–4.0)

## 2023-12-03 ENCOUNTER — Ambulatory Visit: Payer: Self-pay | Admitting: Family Medicine

## 2023-12-08 ENCOUNTER — Ambulatory Visit (INDEPENDENT_AMBULATORY_CARE_PROVIDER_SITE_OTHER): Payer: Self-pay

## 2023-12-08 DIAGNOSIS — J309 Allergic rhinitis, unspecified: Secondary | ICD-10-CM | POA: Diagnosis not present

## 2023-12-15 ENCOUNTER — Ambulatory Visit

## 2023-12-15 DIAGNOSIS — J309 Allergic rhinitis, unspecified: Secondary | ICD-10-CM | POA: Diagnosis not present

## 2023-12-22 ENCOUNTER — Ambulatory Visit (INDEPENDENT_AMBULATORY_CARE_PROVIDER_SITE_OTHER): Payer: Self-pay

## 2023-12-22 DIAGNOSIS — J309 Allergic rhinitis, unspecified: Secondary | ICD-10-CM | POA: Diagnosis not present

## 2023-12-29 ENCOUNTER — Ambulatory Visit (INDEPENDENT_AMBULATORY_CARE_PROVIDER_SITE_OTHER): Payer: Self-pay

## 2023-12-29 DIAGNOSIS — J309 Allergic rhinitis, unspecified: Secondary | ICD-10-CM

## 2024-01-12 ENCOUNTER — Ambulatory Visit (INDEPENDENT_AMBULATORY_CARE_PROVIDER_SITE_OTHER)

## 2024-01-12 DIAGNOSIS — J309 Allergic rhinitis, unspecified: Secondary | ICD-10-CM | POA: Diagnosis not present

## 2024-01-19 ENCOUNTER — Ambulatory Visit (INDEPENDENT_AMBULATORY_CARE_PROVIDER_SITE_OTHER): Payer: Self-pay

## 2024-01-19 DIAGNOSIS — J309 Allergic rhinitis, unspecified: Secondary | ICD-10-CM

## 2024-01-24 ENCOUNTER — Ambulatory Visit (INDEPENDENT_AMBULATORY_CARE_PROVIDER_SITE_OTHER)

## 2024-01-24 DIAGNOSIS — J309 Allergic rhinitis, unspecified: Secondary | ICD-10-CM | POA: Diagnosis not present

## 2024-02-09 ENCOUNTER — Ambulatory Visit

## 2024-02-09 DIAGNOSIS — J309 Allergic rhinitis, unspecified: Secondary | ICD-10-CM

## 2024-02-13 DIAGNOSIS — J302 Other seasonal allergic rhinitis: Secondary | ICD-10-CM | POA: Diagnosis not present

## 2024-02-13 DIAGNOSIS — J3089 Other allergic rhinitis: Secondary | ICD-10-CM | POA: Diagnosis not present

## 2024-02-13 NOTE — Progress Notes (Signed)
 VIALS MADE ON 02/13/24

## 2024-03-01 ENCOUNTER — Ambulatory Visit

## 2024-03-01 DIAGNOSIS — J309 Allergic rhinitis, unspecified: Secondary | ICD-10-CM | POA: Diagnosis not present

## 2024-03-08 ENCOUNTER — Ambulatory Visit

## 2024-03-08 DIAGNOSIS — J302 Other seasonal allergic rhinitis: Secondary | ICD-10-CM

## 2024-03-15 ENCOUNTER — Ambulatory Visit (INDEPENDENT_AMBULATORY_CARE_PROVIDER_SITE_OTHER)

## 2024-03-15 DIAGNOSIS — J302 Other seasonal allergic rhinitis: Secondary | ICD-10-CM | POA: Diagnosis not present

## 2024-03-22 ENCOUNTER — Ambulatory Visit (INDEPENDENT_AMBULATORY_CARE_PROVIDER_SITE_OTHER)

## 2024-03-22 DIAGNOSIS — J302 Other seasonal allergic rhinitis: Secondary | ICD-10-CM | POA: Diagnosis not present

## 2024-05-13 ENCOUNTER — Ambulatory Visit: Admitting: Family Medicine

## 2024-05-30 ENCOUNTER — Ambulatory Visit: Admitting: Family Medicine
